# Patient Record
Sex: Female | Born: 1955
Health system: Southern US, Community
[De-identification: ages and names within clinical notes are randomized; demographics above are authoritative.]

## PROBLEM LIST (undated history)

## (undated) DIAGNOSIS — G629 Polyneuropathy, unspecified: Secondary | ICD-10-CM

## (undated) DIAGNOSIS — K219 Gastro-esophageal reflux disease without esophagitis: Secondary | ICD-10-CM

## (undated) DIAGNOSIS — E039 Hypothyroidism, unspecified: Secondary | ICD-10-CM

## (undated) DIAGNOSIS — E559 Vitamin D deficiency, unspecified: Secondary | ICD-10-CM

## (undated) DIAGNOSIS — Z780 Asymptomatic menopausal state: Secondary | ICD-10-CM

## (undated) DIAGNOSIS — N183 Chronic kidney disease, stage 3 unspecified: Secondary | ICD-10-CM

## (undated) DIAGNOSIS — M1990 Unspecified osteoarthritis, unspecified site: Secondary | ICD-10-CM

## (undated) DIAGNOSIS — E785 Hyperlipidemia, unspecified: Secondary | ICD-10-CM

## (undated) DIAGNOSIS — G4700 Insomnia, unspecified: Secondary | ICD-10-CM

## (undated) DIAGNOSIS — L659 Nonscarring hair loss, unspecified: Secondary | ICD-10-CM

## (undated) DIAGNOSIS — F329 Major depressive disorder, single episode, unspecified: Secondary | ICD-10-CM

## (undated) DIAGNOSIS — F419 Anxiety disorder, unspecified: Secondary | ICD-10-CM

## (undated) DIAGNOSIS — I10 Essential (primary) hypertension: Secondary | ICD-10-CM

## (undated) DIAGNOSIS — E119 Type 2 diabetes mellitus without complications: Secondary | ICD-10-CM

## (undated) DIAGNOSIS — F32A Depression, unspecified: Secondary | ICD-10-CM

## (undated) HISTORY — DX: Polyneuropathy, unspecified: G62.9

## (undated) HISTORY — DX: Chronic kidney disease, stage 3 unspecified: N18.30

## (undated) HISTORY — DX: Essential (primary) hypertension: I10

## (undated) HISTORY — DX: Depression, unspecified: F32.A

## (undated) HISTORY — DX: Anxiety disorder, unspecified: F41.9

## (undated) HISTORY — DX: Hyperlipidemia, unspecified: E78.5

## (undated) HISTORY — DX: Unspecified osteoarthritis, unspecified site: M19.90

## (undated) HISTORY — DX: Insomnia, unspecified: G47.00

## (undated) HISTORY — PX: CATARACT EXTRACTION, BILATERAL: SHX1313

## (undated) HISTORY — DX: Nonscarring hair loss, unspecified: L65.9

## (undated) HISTORY — DX: Vitamin D deficiency, unspecified: E55.9

## (undated) HISTORY — DX: Asymptomatic menopausal state: Z78.0

## (undated) HISTORY — PX: EYE SURGERY: SHX253

## (undated) HISTORY — DX: Gastro-esophageal reflux disease without esophagitis: K21.9

## (undated) HISTORY — PX: WISDOM TOOTH EXTRACTION: SHX21

## (undated) HISTORY — DX: Type 2 diabetes mellitus without complications: E11.9

## (undated) HISTORY — DX: Hypothyroidism, unspecified: E03.9

## (undated) HISTORY — DX: Major depressive disorder, single episode, unspecified: F32.9

---

## 2001-03-23 ENCOUNTER — Other Ambulatory Visit: Admission: RE | Admit: 2001-03-23 | Discharge: 2001-03-23 | Payer: Self-pay | Admitting: Obstetrics and Gynecology

## 2006-05-22 LAB — HM MAMMOGRAPHY

## 2009-06-22 DIAGNOSIS — H269 Unspecified cataract: Secondary | ICD-10-CM

## 2009-06-22 HISTORY — DX: Unspecified cataract: H26.9

## 2010-06-17 ENCOUNTER — Encounter (INDEPENDENT_AMBULATORY_CARE_PROVIDER_SITE_OTHER): Payer: Self-pay | Admitting: *Deleted

## 2010-06-22 HISTORY — PX: COLONOSCOPY: SHX174

## 2010-06-22 HISTORY — PX: POLYPECTOMY: SHX149

## 2010-06-22 LAB — HM COLONOSCOPY: HM Colonoscopy: NORMAL

## 2010-07-24 NOTE — Letter (Signed)
Summary: New Patient letter  [facility]  [address] [place], [place] [address]   Phone: [phone]  Fax: [phone]       06/17/2010 MRN: [id number]  Caroline Simmons [address] [place], [place]  [address]  Dear Ms. Ethier,  Welcome to the Gastroenterology Division at [facility].    You are scheduled to see Dr.  Sheryn Bison on July 25, 2010 at 9:30am on the 3rd floor at [facility], [address]  We ask that you try to arrive at our office 15 minutes prior to your appointment time to allow for check-in.  We would like you to complete the enclosed self-administered evaluation form prior to your visit and bring it with you on the day of your appointment.  We will review it with you.  Also, please bring a complete list of all your medications or, if you prefer, bring the medication bottles and we will list them.  Please bring your insurance card so that we may make a copy of it.  If your insurance requires a referral to see a specialist, please bring your referral form from your primary care physician.  Co-payments are due at the time of your visit and may be paid by cash, check or credit card.     Your office visit will consist of a consult with your physician (includes a physical exam), any laboratory testing he/she may order, scheduling of any necessary diagnostic testing (e.g. x-ray, ultrasound, CT-scan), and scheduling of a procedure (e.g. Endoscopy, Colonoscopy) if required.  Please allow enough time on your schedule to allow for any/all of these possibilities.    If you cannot keep your appointment, please call [phone] to cancel or reschedule prior to your appointment date.  This allows [place] the opportunity to schedule an appointment for another patient in need of care.  If you do not cancel or reschedule by 5 p.m. the business day prior to your appointment date, you will be charged a $50.00 late cancellation/no-show fee.    Thank  you for choosing [facility] for your medical needs.  We appreciate the opportunity to care for you.  Please visit [place] at our website  to learn more about our practice.                     Sincerely,                                                             The Gastroenterology Division

## 2010-07-25 ENCOUNTER — Ambulatory Visit: Payer: Self-pay | Admitting: Gastroenterology

## 2010-07-25 ENCOUNTER — Ambulatory Visit: Admit: 2010-07-25 | Payer: Self-pay | Admitting: Gastroenterology

## 2010-08-22 ENCOUNTER — Ambulatory Visit: Payer: Self-pay | Admitting: Gastroenterology

## 2010-08-26 ENCOUNTER — Ambulatory Visit (INDEPENDENT_AMBULATORY_CARE_PROVIDER_SITE_OTHER): Payer: BC Managed Care – PPO | Admitting: Gastroenterology

## 2010-08-26 ENCOUNTER — Other Ambulatory Visit: Payer: Self-pay | Admitting: Gastroenterology

## 2010-08-26 ENCOUNTER — Encounter: Payer: Self-pay | Admitting: Gastroenterology

## 2010-08-26 ENCOUNTER — Encounter (INDEPENDENT_AMBULATORY_CARE_PROVIDER_SITE_OTHER): Payer: Self-pay | Admitting: *Deleted

## 2010-08-26 ENCOUNTER — Other Ambulatory Visit: Payer: BC Managed Care – PPO

## 2010-08-26 DIAGNOSIS — K6289 Other specified diseases of anus and rectum: Secondary | ICD-10-CM

## 2010-08-26 DIAGNOSIS — I10 Essential (primary) hypertension: Secondary | ICD-10-CM | POA: Insufficient documentation

## 2010-08-26 DIAGNOSIS — E119 Type 2 diabetes mellitus without complications: Secondary | ICD-10-CM | POA: Insufficient documentation

## 2010-08-26 LAB — CBC WITH DIFFERENTIAL/PLATELET
Eosinophils Relative: 4.2 % (ref 0.0–5.0)
HCT: 37.1 % (ref 36.0–46.0)
Lymphs Abs: 1.9 10*3/uL (ref 0.7–4.0)
Monocytes Relative: 7.2 % (ref 3.0–12.0)
Platelets: 230 10*3/uL (ref 150.0–400.0)
WBC: 6.1 10*3/uL (ref 4.5–10.5)

## 2010-08-26 LAB — BASIC METABOLIC PANEL
CO2: 27 mEq/L (ref 19–32)
Glucose, Bld: 121 mg/dL — ABNORMAL HIGH (ref 70–99)
Potassium: 4.3 mEq/L (ref 3.5–5.1)
Sodium: 142 mEq/L (ref 135–145)

## 2010-08-26 LAB — CONVERTED CEMR LAB: Tissue Transglutaminase Ab, IgA: 2.2 units (ref <20)

## 2010-08-26 LAB — IBC PANEL: Saturation Ratios: 12.9 % — ABNORMAL LOW (ref 20.0–50.0)

## 2010-08-26 LAB — MAGNESIUM: Magnesium: 2.1 mg/dL (ref 1.5–2.5)

## 2010-08-26 LAB — SEDIMENTATION RATE: Sed Rate: 26 mm/hr — ABNORMAL HIGH (ref 0–22)

## 2010-08-26 LAB — HEPATIC FUNCTION PANEL
ALT: 31 U/L (ref 0–35)
AST: 26 U/L (ref 0–37)
Albumin: 4.3 g/dL (ref 3.5–5.2)
Alkaline Phosphatase: 58 U/L (ref 39–117)
Total Protein: 7 g/dL (ref 6.0–8.3)

## 2010-08-27 ENCOUNTER — Other Ambulatory Visit: Payer: Self-pay | Admitting: Gastroenterology

## 2010-08-27 ENCOUNTER — Other Ambulatory Visit (AMBULATORY_SURGERY_CENTER): Payer: BC Managed Care – PPO | Admitting: Gastroenterology

## 2010-08-27 DIAGNOSIS — K621 Rectal polyp: Secondary | ICD-10-CM

## 2010-08-27 DIAGNOSIS — Z1211 Encounter for screening for malignant neoplasm of colon: Secondary | ICD-10-CM

## 2010-08-27 DIAGNOSIS — K5730 Diverticulosis of large intestine without perforation or abscess without bleeding: Secondary | ICD-10-CM

## 2010-08-27 DIAGNOSIS — K620 Anal polyp: Secondary | ICD-10-CM

## 2010-09-02 NOTE — Letter (Signed)
Summary: [facility] Instructions  [facility]  [address] [place], [place] [address]   Phone: [phone]  Fax: [phone]       CALLYN SEVERTSON    25-Mar-1956    MRN: [id number]        Procedure Day Dorna Bloom: Wednesday 08/27/2010     Arrival Time: 1:00pm     Procedure Time: 2:00pm     Location of Procedure:                    X  [facility] (4th Floor)                        PREPARATION FOR COLONOSCOPY WITH MOVIPREP    THE DAY BEFORE YOUR PROCEDURE         Tuesday 08/26/2010  1.  Drink clear liquids the entire day-NO SOLID FOOD  2.  Do not drink anything colored red or purple.  Avoid juices with pulp.  No orange juice.  3.  Drink at least 64 oz. (8 glasses) of fluid/clear liquids during the day to prevent dehydration and help the prep work efficiently.  CLEAR LIQUIDS INCLUDE: Water Jello Ice Popsicles Tea (sugar ok, no milk/cream) Powdered fruit flavored drinks Coffee (sugar ok, no milk/cream) Gatorade Juice: apple, white grape, white cranberry  Lemonade Clear bullion, consomm, broth Carbonated beverages (any kind) Strained chicken noodle soup Hard Candy                             4.  In the morning, mix first dose of MoviPrep solution:    Empty 1 Pouch A and 1 Pouch B into the disposable container    Add lukewarm drinking water to the top line of the container. Mix to dissolve    Refrigerate (mixed solution should be used within 24 hrs)  5.  Begin drinking the prep at 5:00 p.m. The MoviPrep container is divided by 4 marks.   Every 15 minutes drink the solution down to the next mark (approximately 8 oz) until the full liter is complete.   6.  Follow completed prep with 16 oz of clear liquid of your choice (Nothing red or purple).  Continue to drink clear liquids until bedtime.  7.  Before going to bed, mix second dose of MoviPrep solution:    Empty 1 Pouch A and 1 Pouch B into the disposable container    Add lukewarm drinking  water to the top line of the container. Mix to dissolve    Refrigerate  THE DAY OF YOUR PROCEDURE      Wednesday 08/27/2010  Beginning at 9:00a, (5 hours before procedure):         1. Every 15 minutes, drink the solution down to the next mark (approx 8 oz) until the full liter is complete.  2. Follow completed prep with 16 oz. of clear liquid of your choice.    3. You may drink clear liquids until 12(noon) (2 HOURS BEFORE PROCEDURE).   MEDICATION INSTRUCTIONS  Unless otherwise instructed, you should take regular prescription medications with a small sip of water   as early as possible the morning of your procedure.  Diabetic patients - see separate instructions.           OTHER INSTRUCTIONS  You will need a responsible adult at least 54 years of age to accompany you and drive you home.   This person  must remain in the waiting room during your procedure.  Wear loose fitting clothing that is easily removed.  Leave jewelry and other valuables at home.  However, you may wish to bring a book to read or  an iPod/MP3 player to listen to music as you wait for your procedure to start.  Remove all body piercing jewelry and leave at home.  Total time from sign-in until discharge is approximately 2-3 hours.  You should go home directly after your procedure and rest.  You can resume normal activities the  day after your procedure.  The day of your procedure you should not:   Drive   Make legal decisions   Operate machinery   Drink alcohol   Return to work  You will receive specific instructions about eating, activities and medications before you leave.    The above instructions have been reviewed and explained to me by   _______________________    I fully understand and can verbalize these instructions _____________________________ Date _________

## 2010-09-02 NOTE — Assessment & Plan Note (Signed)
Summary: ANAL LESION..EM  SCHED WITH JAMIE [phone]  [employer]- INS COPAY A...   History of Present Illness Visit Type: Initial Consult Primary GI MD: Sheryn Bison MD FACP FAGA Primary Provider: [facility] Requesting Provider: Tracey Harries, MD Chief Complaint: Patient here today for further evaluation of anal lesion. She denies any GI symptoms at this time. History of Present Illness:   Extremely Nice 55 year old [place] female referred by Dr. Tracey Harries for evaluation of abnormal anal exam. The patient herself denies any gastrointestinal symptoms whatsoever. She has regular bowel movements without melena, hematochezia, or abdominal pain. She denies any history of known rectal trauma or rectal fissures. Her appetite is good and her weight is stable.  In December she had some hemorrhoids noted and was placed on Anusol-HC suppositories. She currently is not on any rectal suppositories or salves.Family history noncontributory. She has not had previous endoscopies or colonoscopies.there is no history of upper gastrointestinal hepatobiliary complaints. Only symptom today is burning feet bilaterally worse at night unresponsive to Lyrica and Gabapentin. She has had adult onset diabetes for 3 years treated with metformin 500 mg twice a day, central hypertension treated with lisinopril-HCTZ 20-12.5 mg a day.   GI Review of Systems      Denies abdominal pain, acid reflux, belching, bloating, chest pain, dysphagia with liquids, dysphagia with solids, heartburn, loss of appetite, nausea, vomiting, vomiting blood, weight loss, and  weight gain.        Denies anal fissure, black tarry stools, change in bowel habit, constipation, diarrhea, diverticulosis, fecal incontinence, heme positive stool, hemorrhoids, irritable bowel syndrome, jaundice, light color stool, liver problems, rectal bleeding, and  rectal pain. Preventive Screening-Counseling & Management  Alcohol-Tobacco  Smoking Status: quit  Caffeine-Diet-Exercise     Does Patient Exercise: yes      Drug Use:  no.      Current Medications (verified): 1)  Lisinopril-Hydrochlorothiazide 20-12.5 Mg Tabs (Lisinopril-Hydrochlorothiazide) .... Take 1 Tablet By Mouth Once Daily 2)  Metformin Hcl 500 Mg Tabs (Metformin Hcl) .... Take 1 Tablet By Mouth Two Times A Day 3)  Gabapentin 600 Mg Tabs (Gabapentin) .... Take 1 Tablet By Mouth Three Times A Day 4)  Bayer Low Strength 81 Mg Tbec (Aspirin) .... Take 1 Tablet By Mouth Once A Day  Allergies (verified): No Known Drug Allergies  Past History:  Past medical, surgical, family and social histories (including risk factors) reviewed for relevance to current acute and chronic problems.  Past Medical History: Anal Lesion  Past Surgical History: C-Section  Family History: Reviewed history and no changes required. No FH of Colon Cancer: Family History of Diabetes: Mother  Social History: Reviewed history and no changes required. Patient is a former smoker. -stopped over 10 years ago Alcohol Use - no Illicit Drug Use - no Patient gets regular exercise. Smoking Status:  quit Drug Use:  no Does Patient Exercise:  yes  Review of Systems  The patient denies allergy/sinus, anemia, anxiety-new, arthritis/joint pain, back pain, blood in urine, breast changes/lumps, change in vision, confusion, cough, coughing up blood, depression-new, fainting, fatigue, fever, headaches-new, hearing problems, heart murmur, heart rhythm changes, itching, menstrual pain, muscle pains/cramps, night sweats, nosebleeds, pregnancy symptoms, shortness of breath, skin rash, sleeping problems, sore throat, swelling of feet/legs, swollen lymph glands, thirst - excessive , urination - excessive , urination changes/pain, urine leakage, vision changes, and voice change.   Neuro:  Complains of abnormal sensation; denies weakness, paralysis, seizures, syncope, tremors, vertigo, transient  blindness, frequent falls,  frequent headaches, difficulty walking, headache, sciatica, radiculopathy other:, restless legs, memory loss, and confusion.  Vital Signs:  Patient profile:   55 year old female Height:      63.75 inches Weight:      192.38 pounds BMI:     33.40 BSA:     1.92 Pulse rate:   80 / minute Pulse rhythm:   regular BP sitting:   132 / 90  (left arm)  Vitals Entered By: Lamona Curl CMA Duncan Dull) (August 26, 2010 9:16 AM)  Physical Exam  General:  Well developed, well nourished, no acute distress.healthy appearing.   Head:  Normocephalic and atraumatic. Eyes:  PERRLA, no icterus.exam deferred to patient's ophthalmologist.   Neck:  Supple; no masses or thyromegaly. Lungs:  Clear throughout to auscultation. Heart:  Regular rate and rhythm; no murmurs, rubs,  or bruits. Abdomen:  Soft, nontender and nondistended. No masses, hepatosplenomegaly or hernias noted. Normal bowel sounds. Rectal:  Normal exam.prominent at that anterior skin tag noted. I cannot appreciate rectal masses or tenderness. Stool is guaiac-negative. Msk:  Symmetrical with no gross deformities. Normal posture. Pulses:  Normal pulses noted. Extremities:  No clubbing, cyanosis, edema or deformities noted. Neurologic:  Alert and  oriented x4;  grossly normal neurologically. Cervical Nodes:  No significant cervical adenopathy. Psych:  Alert and cooperative. Normal mood and affect.   Impression & Recommendations:  Problem # 1:  RECTAL PAIN (EAV-409.81) Assessment Improved Probable healed anal fissure causing prominent anterior skin tag. She is currently asymptomatic and stool is guaiac negative. I have scheduled colonoscopy at her convenience. Orders: TLB-CBC Platelet - w/Differential (85025-CBCD) TLB-BMP (Basic Metabolic Panel-BMET) (80048-METABOL) TLB-Hepatic/Liver Function Pnl (80076-HEPATIC) TLB-TSH (Thyroid Stimulating Hormone) (84443-TSH) TLB-B12, Serum-Total ONLY  (19147-W29) TLB-Ferritin (82728-FER) TLB-Folic Acid (Folate) (82746-FOL) TLB-IBC Pnl (Iron/FE;Transferrin) (83550-IBC) TLB-Sedimentation Rate (ESR) (85652-ESR) TLB-IgA (Immunoglobulin A) (82784-IGA) TLB-Magnesium (Mg) (83735-MG) T-Sprue Panel (Celiac Disease Aby Eval) (83516x3/[address]) Colonoscopy (Colon)  Problem # 2:  HYPERTENSION (ICD-401.9) Assessment: Improved blood pressure today 132/90, she is to continue her antihypertensives as outlined.  Problem # 3:  DIABETES MELLITUS, TYPE II (ICD-250.00) Assessment: Improved continue metformin 500 mg twice a day. Her peripheral neuropathy in her feet may be related to her diabetes although this is unlikely. I have ordered screening labs including B12, magnesium, celiac profile, and sedimentation rate. She has an appointment with neurology in mid March for evaluation.  Patient Instructions: 1)  Copy sent to : [facility] & Tracey Harries, MD 2)  Your procedure has been scheduled for 08/27/2010, please follow the seperate instructions.  3)  [facility] Patient Information Guide given to patient.  4)  Colonoscopy and Flexible Sigmoidoscopy brochure given.  5)  Your prescription(s) have been sent to you pharmacy.  6)  Please go to the basement today for your labs.  7)  The medication list was reviewed and reconciled.  All changed / newly prescribed medications were explained.  A complete medication list was provided to the patient / caregiver. Prescriptions: MOVIPREP 100 GM  SOLR (PEG-KCL-NACL-NASULF-NA ASC-C) As per prep instructions.  #1 x 0   Entered by:   Harlow Mares CMA (AAMA)   Authorized by:   Mardella Layman MD [facility]   Signed by:   Harlow Mares CMA (AAMA) on 08/26/2010   Method used:   Electronically to        [facility] [phone]* (retail)       [address]  [place], [place]  [address]       Ph: [phone]       Fax: [phone]   RxID:   [id number]

## 2010-09-02 NOTE — Letter (Signed)
Summary: Diabetic Instructions  [facility]  [address] [place], [place] [address]   Phone: [phone]  Fax: [phone]    Caroline Simmons [birth year] MRN: [id number]   X   ORAL DIABETIC MEDICATION INSTRUCTIONS  The day before your procedure:   Take your diabetic pill as you do normally  The day of your procedure:   Do not take your diabetic pill    We will check your blood sugar levels during the admission process and again in Recovery before discharging you home

## 2010-09-02 NOTE — Procedures (Addendum)
Summary: Colonoscopy  Patient: Caroline Simmons Note: All result statuses are Final unless otherwise noted.  Tests: (1) Colonoscopy (COL)   COL Colonoscopy           DONE     [facility]     [address]     [place], [place]  [address]          COLONOSCOPY PROCEDURE REPORT          PATIENT:  [name], [name]  MR#:  [id number]     BIRTHDATE:  08/18/1955, 54 yrs. old  GENDER:  female     ENDOSCOPIST:  Vania Rea. Jarold Motto, MD, [facility]     REF. BY:  Tracey Harries, M.D.     PROCEDURE DATE:  08/27/2010     PROCEDURE:  Colonoscopy with biopsy     ASA CLASS:  Class II     INDICATIONS:  Routine Risk Screening ANTERIOR SKIN TAG.     MEDICATIONS:   Fentanyl 50 mcg IV, Versed 8 IV          DESCRIPTION OF PROCEDURE:   After the risks benefits and     alternatives of the procedure were thoroughly explained, informed     consent was obtained.  Digital rectal exam was performed and     revealed no abnormalities.  ANTERIOR SKIN TAG NOTED. The LB 180AL     [id number] endoscope was introduced through the anus and advanced to     the cecum, which was identified by both the appendix and ileocecal     valve, without limitations.  The quality of the prep was     excellent, using MoviPrep.  The instrument was then slowly     withdrawn as the colon was fully examined.     <<PROCEDUREIMAGES>>          FINDINGS:  Scattered diverticula were found throughout the colon.     A diminutive polyp was found in the rectum. COLD BIOPSY REMOVED.NO     FISSURES.FISTULAE, MASSES NOTED.   Retroflexed views in the rectum     revealed no abnormalities.    The scope was then withdrawn from     the patient and the procedure completed.          COMPLICATIONS:  None     ENDOSCOPIC IMPRESSION:     1) Diverticula, scattered throughout the colon     2) Diminutive polyp in the rectum     R/O ADENOMA.     RECOMMENDATIONS:     1) Repeat Colonscopy in 5-10 years.     2) high fiber diet     REPEAT EXAM:  No        ______________________________     Vania Rea. Jarold Motto, MD, [facility]          CC:          n.     eSIGNED:   Vania Rea. Patterson at 08/27/2010 02:41 PM          [name], [id number]  Note: An exclamation mark (!) indicates a result that was not dispersed into the flowsheet. Document Creation Date: 08/27/2010 2:41 PM _______________________________________________________________________  (1) Order result status: Final Collection or observation date-time: 08/27/2010 14:33 Requested date-time:  Receipt date-time:  Reported date-time:  Referring Physician:   Ordering Physician: Sheryn Bison [phone]) Specimen Source:  Source: Launa Grill Order Number: [id number] Lab site:   Appended Document: Colonoscopy  10 year followup  Appended Document: Colonoscopy     Procedures Next  Due Date:    Colonoscopy: 08/2020

## 2010-09-08 ENCOUNTER — Encounter: Payer: Self-pay | Admitting: Gastroenterology

## 2010-09-18 NOTE — Letter (Signed)
Summary: Patient Notice- Polyp Results  [facility]  [address] [place], [place] [address]   Phone: [phone]  Fax: [phone]        September 08, 2010 MRN: [id number]    Caroline Simmons [address] [place], [place]  [address]    Dear Ms. Facemire,  I am pleased to inform you that the colon polyp(s) removed during your recent colonoscopy was (were) found to be benign (no cancer detected) upon pathologic examination.  I recommend you have a repeat colonoscopy examination in  10_ years to look for recurrent polyps, as having colon polyps increases your Simmons for having recurrent polyps or even colon cancer in the future.  Should you develop new or worsening symptoms of abdominal pain, bowel habit changes or bleeding from the rectum or bowels, please schedule an evaluation with either your primary care physician or with me.  Additional information/recommendations:  _x_ No further action with gastroenterology is needed at this time. Please      follow-up with your primary care physician for your other healthcare      needs.  __ Please call [phone] to schedule a return visit to review your      situation.  __ Please keep your follow-up visit as already scheduled.  __ Continue treatment plan as outlined the day of your exam.  Please call us if you are having persistent problems or have questions about your condition that have not been fully answered at this time.  Sincerely,  Mardella Layman MD [facility]  This letter has been electronically signed by your physician.  Appended Document: Patient Notice- Polyp Results letter mailed

## 2011-04-09 LAB — TSH: TSH: 5.19 u[IU]/mL (ref <=5.90)

## 2011-08-11 ENCOUNTER — Encounter: Payer: Self-pay | Admitting: *Deleted

## 2011-08-11 DIAGNOSIS — E039 Hypothyroidism, unspecified: Secondary | ICD-10-CM

## 2011-08-11 DIAGNOSIS — G4700 Insomnia, unspecified: Secondary | ICD-10-CM

## 2011-08-11 DIAGNOSIS — I10 Essential (primary) hypertension: Secondary | ICD-10-CM

## 2011-08-12 ENCOUNTER — Encounter: Payer: Self-pay | Admitting: Family Medicine

## 2011-08-12 ENCOUNTER — Ambulatory Visit (INDEPENDENT_AMBULATORY_CARE_PROVIDER_SITE_OTHER): Payer: 59 | Admitting: Family Medicine

## 2011-08-12 DIAGNOSIS — E039 Hypothyroidism, unspecified: Secondary | ICD-10-CM

## 2011-08-12 DIAGNOSIS — E119 Type 2 diabetes mellitus without complications: Secondary | ICD-10-CM

## 2011-08-12 MED ORDER — LEVOTHYROXINE SODIUM 50 MCG PO TABS: 50.0000 ug | ORAL_TABLET | Freq: Every day | ORAL | Status: DC

## 2011-08-12 MED ORDER — METFORMIN HCL 500 MG PO TABS: ORAL_TABLET | ORAL | Status: DC

## 2011-08-12 MED ORDER — LISINOPRIL-HYDROCHLOROTHIAZIDE 20-12.5 MG PO TABS: 1.0000 | ORAL_TABLET | Freq: Every day | ORAL | Status: DC

## 2011-08-12 NOTE — Patient Instructions (Signed)
Peripheral Neuropathy Peripheral neuropathy is a common disorder of your nerves resulting from damage. CAUSES  This disorder may be caused by a disease of the nerves or illness. Many neuropathies have well known causes such as:  Diabetes. This is one of the most common causes.   Uremia.   AIDS.   Nutritional deficiencies.   Other causes include mechanical pressures. These may be from:   Compression.   Injury.   Contusions or bruises.   Fracture or dislocated bones.   Pressure involving the nerves close to the surface. Nerves such as the ulnar, or radial can be injured by prolonged use of crutches.  Other injuries may come from:  Tumor.   Hemorrhage or bleeding into a nerve.   Exposure to cold or radiation.   Certain medicines or toxic substances (rare).   Vascular or collagen disorders such as:   Atherosclerosis.   Systemic lupus erythematosus.   Scleroderma.   Sarcoidosis.   Rheumatoid arthritis.   Polyarteritis nodosa.   A large number of cases are of unknown cause.  SYMPTOMS  Common problems include:  Weakness.   Numbness.   Abnormal sensations (paresthesia) such as:   Burning.   Tickling.   Pricking.   Tingling.   Pain in the arms, hands, legs and/or feet.  TREATMENT  Therapy for this disorder differs depending on the cause. It may vary from medical treatment with medications or physical therapy among others.   For example, therapy for this disorder caused by diabetes involves control of the diabetes.   In cases where a tumor or ruptured disc is the cause, therapy may involve surgery. This would be to remove the tumor or to repair the ruptured disc.   In entrapment or compression neuropathy, treatment may consist of splinting or surgical decompression of the ulnar or median nerves. A common example of entrapment neuropathy is carpal tunnel syndrome. This has become more common because of the increasing use of computers.   Peroneal and  radial compression neuropathies may require avoidance of pressure.   Physical therapy and/or splints may be useful in preventing contractures. This is a condition in which shortened muscles around joints cause abnormal and sometimes painful positioning of the joints.  Document Released: 05/29/2002 Document Revised: 02/18/2011 Document Reviewed: 06/08/2005 [employer] Patient Information 2012 [employer]. 

## 2011-08-12 NOTE — Progress Notes (Signed)
Quick Note:  Please notify pt that results are normal. ______ 

## 2011-08-12 NOTE — Progress Notes (Signed)
  Subjective:    Patient ID: Caroline Simmons, female    DOB: 1956/03/01, 56 y.o.   MRN: [id number]  HPI    This 56 y.o AA female returns for f/u: Type 2 Diabetes and Hypothyroidism. She checks FSBS  and values are about 110-120. Denies hypoglycemia. Taking Metformin 500 mg 1 tab q AM and 2 tabs  in PM; meads are taken before meals and she takes one of the PM Metformin at bedtime.  She c/o puffy eyelids, worse at bedtime. No other symptoms suggesting absence of euthyroid state.  Bilateral foot pain with burning continues; takes med as prescribed by foot specialist. She plans to contact   his office today for follow-up. She is trying to resume some sort of regular exercise.    Review of Systems  As per HPI; otherwise, Negative     Objective:   Physical Exam  Constitutional: She is oriented to person, place, and time. She appears well-developed and well-nourished. No distress.  HENT:  Head: Normocephalic and atraumatic.  Eyes: EOM are normal. No scleral icterus.  Cardiovascular: Normal rate and intact distal pulses.   Pulmonary/Chest: Effort normal. No respiratory distress.  Musculoskeletal: Normal range of motion.  Neurological: She is alert and oriented to person, place, and time. No cranial nerve deficit.       Foot exam performed by CMA.  Skin: Skin is warm and dry. No rash noted.  Psychiatric: She has a normal mood and affect. Her behavior is normal.   A1c= 6.4 %  TSH  (03/2011)= 5.19      Assessment & Plan:   1. Diabetes type 2, controlled  POCT HgB A1C; RF: Metformin 500 mg continue 1tab q AM and 2 tabs q PM; pt advised to take medication after meals to avoid hypoglycemia  2. Hypothyroidism  TSH pending; continue current dose of medication

## 2011-08-31 ENCOUNTER — Telehealth: Payer: Self-pay

## 2011-08-31 NOTE — Telephone Encounter (Signed)
.  [facility] PT IN NEED OF HER METFORMIN AND LISINOPRIL. PLEASE CALL [phone]    [facility] [address] IN [place]

## 2011-08-31 NOTE — Telephone Encounter (Signed)
Please have patient contact their pharmacy. We can no longer keep taking requests over the phone.  Caroline Simmons

## 2011-09-01 NOTE — Telephone Encounter (Signed)
Called pt LMOM to have pharmacy fax requests

## 2011-09-09 ENCOUNTER — Telehealth: Payer: Self-pay

## 2011-09-09 NOTE — Telephone Encounter (Signed)
Please contact pt she was crying uncontrollably and states she was prescribed thyroid medicine by Dr Audria Nine and she thinks that's why she is depressed and gaining weight, please contact pt.

## 2011-09-09 NOTE — Telephone Encounter (Signed)
Pt states she is crying uncontrollably (could hardly understand her on the phone). She thinks it is due to her thyroid med--she is gaining weight instead of losing weight. Please advise.

## 2011-09-09 NOTE — Telephone Encounter (Signed)
I might suggest pt recheck.  Dr. Audria Nine Rx the same med that pt stated she was on at her visit and she just refilled the same meds because her labs were normal.

## 2011-09-10 NOTE — Telephone Encounter (Signed)
lmom to rtc for ov

## 2011-09-11 NOTE — Telephone Encounter (Signed)
Pt returning tl clinical phone call

## 2011-09-15 ENCOUNTER — Ambulatory Visit: Payer: 59 | Admitting: Internal Medicine

## 2011-09-15 VITALS — BP 147/94 | HR 97 | Temp 98.4°F | Resp 18 | Ht 65.0 in | Wt 189.0 lb

## 2011-09-15 DIAGNOSIS — E1140 Type 2 diabetes mellitus with diabetic neuropathy, unspecified: Secondary | ICD-10-CM

## 2011-09-15 DIAGNOSIS — E1049 Type 1 diabetes mellitus with other diabetic neurological complication: Secondary | ICD-10-CM

## 2011-09-15 DIAGNOSIS — K219 Gastro-esophageal reflux disease without esophagitis: Secondary | ICD-10-CM

## 2011-09-15 DIAGNOSIS — Z719 Counseling, unspecified: Secondary | ICD-10-CM

## 2011-09-15 DIAGNOSIS — F329 Major depressive disorder, single episode, unspecified: Secondary | ICD-10-CM

## 2011-09-15 DIAGNOSIS — F4321 Adjustment disorder with depressed mood: Secondary | ICD-10-CM

## 2011-09-15 DIAGNOSIS — R5383 Other fatigue: Secondary | ICD-10-CM

## 2011-09-15 DIAGNOSIS — Z79899 Other long term (current) drug therapy: Secondary | ICD-10-CM

## 2011-09-15 DIAGNOSIS — R070 Pain in throat: Secondary | ICD-10-CM

## 2011-09-15 DIAGNOSIS — R635 Abnormal weight gain: Secondary | ICD-10-CM

## 2011-09-15 MED ORDER — FLUOXETINE HCL 20 MG PO TABS: ORAL_TABLET | ORAL | Status: DC

## 2011-09-15 MED ORDER — HYDROCODONE-ACETAMINOPHEN 5-500 MG PO TABS: 1.0000 | ORAL_TABLET | Freq: Four times a day (QID) | ORAL | Status: AC | PRN

## 2011-09-15 NOTE — Patient Instructions (Signed)

## 2011-09-15 NOTE — Progress Notes (Signed)
  Subjective:    Patient ID: Caroline Simmons, female    DOB: 1955/07/18, 56 y.o.   MRN: [id number]  HPI Has depression caused by losing her job and not able to find another. She is lonely and gaining weight even while exercising. See depression screen. Also has a sore throat for a while. Did quit smoking 5 yrs. No illness     Review of Systems Dr. Audria Nine does regular care and will f/up.   Objective:   Physical Exam Crying, sad and mildly hoarse. Recent lab work normal Heart, lungs ,abdomen, thyroid all normal       Assessment & Plan:  Acute reactive depression  Amitriptylene interaction with SSRIs reviewed and counseled.  Trial of Fluoxitine start with 10mg  and may go to 20mg . Keep dose of Elavil at 100mg  or less at hs. Close f/up with Dr. Audria Nine, appt made  Refer to counselor

## 2011-09-29 ENCOUNTER — Encounter: Payer: Self-pay | Admitting: Family Medicine

## 2011-09-29 ENCOUNTER — Ambulatory Visit (INDEPENDENT_AMBULATORY_CARE_PROVIDER_SITE_OTHER): Payer: 59 | Admitting: Family Medicine

## 2011-09-29 VITALS — BP 140/88 | HR 104 | Temp 98.3°F | Resp 16 | Ht 64.5 in | Wt 190.2 lb

## 2011-09-29 DIAGNOSIS — F418 Other specified anxiety disorders: Secondary | ICD-10-CM

## 2011-09-29 DIAGNOSIS — F341 Dysthymic disorder: Secondary | ICD-10-CM

## 2011-09-29 DIAGNOSIS — E1140 Type 2 diabetes mellitus with diabetic neuropathy, unspecified: Secondary | ICD-10-CM

## 2011-09-29 DIAGNOSIS — E1149 Type 2 diabetes mellitus with other diabetic neurological complication: Secondary | ICD-10-CM

## 2011-09-29 DIAGNOSIS — E1142 Type 2 diabetes mellitus with diabetic polyneuropathy: Secondary | ICD-10-CM

## 2011-09-29 NOTE — Patient Instructions (Signed)
Diabetic Neuropathy Diabetic neuropathy is a common complication caused by diabetes. Neuropathy is a term that means nerve disease or damage. If your diabetes is uncontrolled and you have high blood glucose (sugar) levels, over time, this can lead to damage to nerves throughout your body. There are three types of diabetic neuropathy:   Peripheral.   Autonomic.   Focal.  PERIPHERAL NEUROPATHY Peripheral neuropathy is the most common form of diabetic neuropathy. It causes damage to the nerves of the feet and legs and eventually the hands and arms.  SYMPTOMS  Peripheral neuropathy occurs slowly over time. The peripheral nerves sense touch, hot and cold, and pain. When these nerves no longer work:   Your feet become numb.   You can no longer feel pressure or pain in your feet.   You may have burning, stabbing or aching pain.  This can lead to:  Thick calluses over pressure areas.   Pressure sores.   Ulcers. Ulcers can become infected with germs (bacteria) and can even lead to infection in the bones of the feet.  DIAGNOSIS  The diagnosis of diabetic neuropathy is difficult at best. Sensory function testing can be done with:  Light touch using a monofilament.   Vibration with tuning fork.   Sharp sensation with pin prick  Other tests that can help diagnose neuropathy are:  Nerve Conduction Velocities (NCV). This checks the transmission of electrical current through a nerve.   Electromyography (EMG). This shows how muscles respond to electrical signals transmitted by nearby nerves.   Quantitative sensory testing, which is used to assess how your nerves respond to vibration and changes in temperature.  AUTONOMIC NEUROPATHY The autonomic nervous system controls functions that you do not think about. Examples would be:   Heart beat.   Regulation of body temperature.   Blood pressure.   Urination.   Digestion.   Sweating.   Sexual function.  SYMPTOMS  The symptoms of  autonomic neuropathy vary depending on which nerves are affected.   There can be problems with digestion such as:   Feeling sick to your stomach (nausea).   Vomiting.   Bloating.   Constipation.   Diarrhea.   Abdominal pain.   Difficulty with urination may occur because of the inability to sense when your bladder is full. You may have urine leakage (incontinence) or inability to empty your bladder completely (retention).   Palpitations or a feeling of an abnormal heart beat.   Blood pressure drops on arising (orthostatic hypotension). This can happen when you first sit up or stand up. It causes you to feel:   Dizzy.   Weak.   Faint.   Sexual functioning:   In men, inability to attain and maintain an erection.   In women, vaginal dryness and problems with decreased sexual desire and arousal.  DIAGNOSIS  Diagnosis is often based on reported symptoms. Tell your medical caregiver if you experience:   Dizziness.   Constipation.   Diarrhea.   Inappropriate urination or inability to urinate.   Inability to get or maintain an erection.  Tests that may be done include:  An EKG or Holter Monitor. These are tests that can help show problems with the heart rate or heart rhythm.   X-rays can be used to find if there are problems with your ability to properly empty food from your stomach into the small intestine after eating.  FOCAL NEUROPATHY Focal neuropathy affects just one nerve tract and occurs suddenly. However, it usually improves by itself   over time. It does not cause long term damage, and treatments are usually needed only until the problem improves. SYMPTOMS  Examples include:   Abnormal eye movements or abnormal alignment of both eyes.   Weakness in the wrist.   Foot drop, which results in inability to lift the foot properly. This causes abnormal walking or foot movement.  DIAGNOSIS  Diagnosis is made based on your symptoms and what your caregiver finds on  your exam. Other tests that may be done include:  Nerve Conduction Velocities (NCV). This checks the transmission of electrical current through a nerve.   Electromyography (EMG). This shows how muscles respond to electrical signals transmitted by nearby nerves.   Quantitative sensory testing, which is used to assess how your nerves respond to vibration and changes in temperature.  TREATMENT Once nerve damage occurs it cannot be reversed. The goal of treatment is to keep the disease from getting worse. If it gets worse, it will affect more nerve fibers. Controlling your blood (sugar) is the key. You will need to keep your blood glucose and A1c at the target range prescribed by your caregiver. Things that will help control blood glucose levels include:  Blood glucose monitoring.   Meal planning.   Physical activity.   Diabetes medication.  Over time, maintaining lower blood glucose levels helps lessen symptoms. Sometimes, prescription pain medicine is needed. Focal neuropathy can be painful and unpredictable and occurs most often in older adults with diabetes.  SEEK MEDICAL CARE IF:   You develop peripheral nerve symptoms such as burning, numbness, or pain in your feet, legs or hands.   You develop autonomic nerve symptoms such as:   Dizziness.   Abnormal urinary control.   Inability to get an erection.   You develop focal nerve symptoms such as sudden abnormal eye movements or sudden foot drop.  Document Released: 08/17/2001 Document Revised: 05/28/2011 Document Reviewed: 11/16/2008 Exit[employer] Patient Information 2012 [employer]. 

## 2011-09-29 NOTE — Progress Notes (Signed)
  Subjective:    Patient ID: Caroline Simmons, female    DOB: October 12, 1955, 56 y.o.   MRN: [id number]  HPI   This woman returns for follow-up after starting treatment for depression; she is taking Fluoxetine  20 mg every morning and still has crying spells and anhedonia with insomnia. This is complicated by Diabetic  Neuropathy involving her feet, with chronic pain and burning not adequately resolved with Amitriptyline. She is  only taking 100 mg of Amitriptyline at night because taking 2 tablets was giving her a "hung-over" type feeling.  She is getting some relief with  Hydrocodone for pain. She is depressed because she has been unemployed  almost 2 years; she does not have the mental and emotional strength to do any meaningful job search at this time.  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 She is considering EMT certification  but is having concentration problems at present and is not able to study for the pre-qualification exam.  She has little support (no family here) and few close friends.    Review of Systems  Constitutional: Positive for activity change and fatigue.  Respiratory: Negative for chest tightness.   Cardiovascular: Negative for chest pain and leg swelling.  Neurological:       Bilateral foot pain  Psychiatric/Behavioral: Positive for sleep disturbance, dysphoric mood and decreased concentration. Negative for suicidal ideas. The patient is nervous/anxious.        Tearful every day       Objective:   Physical Exam  Vitals reviewed. Constitutional: She is oriented to person, place, and time. She appears well-developed and well-nourished. She appears distressed.       Tearful  HENT:  Head: Normocephalic and atraumatic.  Eyes:       Conjunctivae injected  Cardiovascular: Normal rate.   Pulmonary/Chest: Effort normal. No respiratory distress.  Neurological: She is alert and oriented to person, place, and time. No cranial  nerve deficit.  Psychiatric: Judgment and thought content normal.       Flat affect, depressed mood          Assessment & Plan:   1. Depression with anxiety      Continue Fluoxetine 20 mg -up dose to 2 tablets every AM  2. Diabetic neuropathy    Last A1c= 6.4% in February 2013; no medication changes; information given to pt re: Anti-inflammatory diet; may need to consider Lyrica to reduce symptoms

## 2011-10-13 ENCOUNTER — Encounter: Payer: Self-pay | Admitting: Family Medicine

## 2011-10-13 ENCOUNTER — Ambulatory Visit (INDEPENDENT_AMBULATORY_CARE_PROVIDER_SITE_OTHER): Payer: 59 | Admitting: Family Medicine

## 2011-10-13 VITALS — BP 126/92 | HR 105 | Temp 98.5°F | Resp 16 | Ht 65.0 in | Wt 186.0 lb

## 2011-10-13 DIAGNOSIS — E119 Type 2 diabetes mellitus without complications: Secondary | ICD-10-CM

## 2011-10-13 DIAGNOSIS — F329 Major depressive disorder, single episode, unspecified: Secondary | ICD-10-CM

## 2011-10-13 DIAGNOSIS — E1140 Type 2 diabetes mellitus with diabetic neuropathy, unspecified: Secondary | ICD-10-CM

## 2011-10-13 MED ORDER — HYDROCODONE-ACETAMINOPHEN 5-500 MG PO TABS: ORAL_TABLET | ORAL | Status: DC

## 2011-10-13 NOTE — Progress Notes (Signed)
  Subjective:    Patient ID: Caroline Simmons, female    DOB: [birth year], 56 y.o.   MRN: [id number]  HPI Pt feels a lot better but she still has some anxiety at night which awakens her at night. She uses  a [place] oil [place] healing oil) which helps with pain. Taking Hydrocodone for pain also helps.  She takes Amitriptyline if she wakes up in the middle of the night and has trouble getting back to sleep.  She still having problems with concentration and mental clarity.    Review of Systems   As per HPI     Objective:   Physical Exam  Vitals reviewed. Constitutional: She is oriented to person, place, and time. She appears well-developed and well-nourished. No distress.  HENT:  Head: Normocephalic and atraumatic.  Eyes: EOM are normal. No scleral icterus.  Cardiovascular: Normal rate and regular rhythm.        Heart rate recheck= 80  Pulmonary/Chest: Effort normal. No respiratory distress.  Neurological: She is alert and oriented to person, place, and time.  Psychiatric: She has a normal mood and affect. Her behavior is normal. Thought content normal.       Pt was smiling today and relaxed while conversing with provider and staff          Assessment & Plan:   1. Depression      Continue current dose of Fluoxitine  2. Diabetic neuropathy, painful        RF: Hydrocodone- APAP to be taken hs as needed

## 2011-10-22 ENCOUNTER — Encounter: Payer: Self-pay | Admitting: Family Medicine

## 2011-10-23 ENCOUNTER — Telehealth: Payer: Self-pay

## 2011-10-23 NOTE — Telephone Encounter (Signed)
.  [facility] The patient called requesting that Dr. Audria Nine write a letter documenting her clinical depression.  Please call patient at [phone].

## 2011-11-03 ENCOUNTER — Encounter: Payer: Self-pay | Admitting: Family Medicine

## 2011-11-03 NOTE — Telephone Encounter (Signed)
LMOM that letter is ready for p/up. Letter printed and put in drawer

## 2011-11-03 NOTE — Telephone Encounter (Signed)
Letter has been done and pt can be contacted to pick it up at [facility].

## 2011-11-11 ENCOUNTER — Ambulatory Visit: Payer: 59 | Admitting: Family Medicine

## 2011-11-12 HISTORY — PX: RETINAL DETACHMENT REPAIR W/ SCLERAL BUCKLE LE: SHX2338

## 2011-11-25 ENCOUNTER — Encounter: Payer: Self-pay | Admitting: Family Medicine

## 2011-11-25 ENCOUNTER — Ambulatory Visit (INDEPENDENT_AMBULATORY_CARE_PROVIDER_SITE_OTHER): Payer: 59 | Admitting: Family Medicine

## 2011-11-25 VITALS — BP 131/86 | HR 84 | Temp 97.0°F | Resp 16 | Ht 64.0 in | Wt 186.4 lb

## 2011-11-25 DIAGNOSIS — F418 Other specified anxiety disorders: Secondary | ICD-10-CM

## 2011-11-25 DIAGNOSIS — F329 Major depressive disorder, single episode, unspecified: Secondary | ICD-10-CM

## 2011-11-25 DIAGNOSIS — E1140 Type 2 diabetes mellitus with diabetic neuropathy, unspecified: Secondary | ICD-10-CM

## 2011-11-25 DIAGNOSIS — E1149 Type 2 diabetes mellitus with other diabetic neurological complication: Secondary | ICD-10-CM

## 2011-11-25 DIAGNOSIS — H3322 Serous retinal detachment, left eye: Secondary | ICD-10-CM | POA: Insufficient documentation

## 2011-11-25 MED ORDER — HYDROCODONE-ACETAMINOPHEN 5-500 MG PO TABS: ORAL_TABLET | ORAL | Status: DC

## 2011-11-25 NOTE — Progress Notes (Signed)
  Subjective:    Patient ID: Oyindamola Key, female    DOB: 12/05/1955, 56 y.o.   MRN: [id number]  HPI   56 y.o. AA female returns today for recheck; Depression is stable but still has "good" days  and some "cloudy" days. Recently had surgery for detached retina- f/u appt is 12/09/11. Not able to   even consider employment at this time; unemployment benefits run out end of this month. She has  started to consider Disability. Needs RF for Vicodin (takes it at bedtime for neuropathic pain which  is most problematic at night).     Review of Systems As per HPI     Objective:   Physical Exam  Vitals reviewed. Constitutional: She is oriented to person, place, and time. She appears well-developed and well-nourished. No distress.  HENT:  Head: Normocephalic and atraumatic.  Eyes:       Left eye conj is red medially and lid is swollen  Cardiovascular: Normal rate.   Pulmonary/Chest: Effort normal. No respiratory distress.  Neurological: She is alert and oriented to person, place, and time. No cranial nerve deficit.  Psychiatric:       Flat affect          Assessment & Plan:   1. Diabetic neuropathy, painful    RF: Vicodin (generic)  #30 with 1 RF  2. Depression -stable; pt remains optimistic. Advised seeking services for Visually Impaired and getting documentation from Ophthalmologist re: current disability

## 2011-12-21 DIAGNOSIS — Z0271 Encounter for disability determination: Secondary | ICD-10-CM

## 2012-02-02 ENCOUNTER — Other Ambulatory Visit: Payer: Self-pay | Admitting: Family Medicine

## 2012-02-25 ENCOUNTER — Ambulatory Visit: Payer: 59 | Admitting: Family Medicine

## 2012-03-15 ENCOUNTER — Ambulatory Visit (INDEPENDENT_AMBULATORY_CARE_PROVIDER_SITE_OTHER): Payer: 59 | Admitting: Family Medicine

## 2012-03-15 ENCOUNTER — Encounter: Payer: Self-pay | Admitting: Family Medicine

## 2012-03-15 VITALS — BP 136/93 | HR 105 | Temp 97.5°F | Resp 16 | Ht 64.5 in | Wt 185.0 lb

## 2012-03-15 DIAGNOSIS — E119 Type 2 diabetes mellitus without complications: Secondary | ICD-10-CM

## 2012-03-15 DIAGNOSIS — F341 Dysthymic disorder: Secondary | ICD-10-CM

## 2012-03-15 DIAGNOSIS — E039 Hypothyroidism, unspecified: Secondary | ICD-10-CM

## 2012-03-15 DIAGNOSIS — F418 Other specified anxiety disorders: Secondary | ICD-10-CM

## 2012-03-15 LAB — COMPREHENSIVE METABOLIC PANEL
ALT: 37 U/L — ABNORMAL HIGH (ref 0–35)
AST: 27 U/L (ref 0–37)
CO2: 24 mEq/L (ref 19–32)
Calcium: 9.8 mg/dL (ref 8.4–10.5)
Chloride: 104 mEq/L (ref 96–112)
Sodium: 142 mEq/L (ref 135–145)
Total Protein: 7.4 g/dL (ref 6.0–8.3)

## 2012-03-15 LAB — TSH: TSH: 2.066 u[IU]/mL (ref 0.350–4.500)

## 2012-03-15 MED ORDER — AMITRIPTYLINE HCL 100 MG PO TABS: 100.0000 mg | ORAL_TABLET | Freq: Every day | ORAL | Status: DC

## 2012-03-15 MED ORDER — BUSPIRONE HCL 10 MG PO TABS: ORAL_TABLET | ORAL | Status: DC

## 2012-03-15 MED ORDER — LEVOTHYROXINE SODIUM 50 MCG PO TABS: 50.0000 ug | ORAL_TABLET | Freq: Every day | ORAL | Status: DC

## 2012-03-15 MED ORDER — LISINOPRIL-HYDROCHLOROTHIAZIDE 20-12.5 MG PO TABS: 1.0000 | ORAL_TABLET | Freq: Every day | ORAL | Status: DC

## 2012-03-15 NOTE — Patient Instructions (Addendum)
I am not going to change any of your medications at this time. Bring your medications to your next visit. I think that you might consider counseling to help with anxiety.

## 2012-03-18 ENCOUNTER — Encounter: Payer: Self-pay | Admitting: Family Medicine

## 2012-03-18 NOTE — Progress Notes (Signed)
Quick Note:  Please notify pt that results are normal.   Provide pt with copy of labs. ______ 

## 2012-03-18 NOTE — Progress Notes (Signed)
S: This 56 y.o. AA female returns for follow-up re: DM with neuropathy. She is compliant with medications and denies hypoglycemia. Amitriptyline hs helps with sleep; still taking Fluoxetine 20 mg but only once a day.  Phenytoin 50 mg 3x/day has been prescribed to reduce foot pain. She has depression with insomnia associated  with neuropathy as well as inability to work full time. She has left eye surgery in May to repair a detached retina; vision has not  returned to normal- "I still have trouble with this eye".  Still feels depressed about inability to work; she states she has no energy to job hunt , trouble with concentration and focus and cannot even read applications to complete employment process. She has been denied SSI and has a meeting scheduled with a Clinical research associate.  ROS: As per HPI: denies diaphoresis/fever/chills, unexplained weight change, CP or tightness, SOB or sough, GI upset with medications, dizziness or syncope. Pt has ongoing sleep disorder and depression (is taking Fluoxetine 20 mg daily);  no HI/SI.  O:  Filed Vitals:   03/15/12 1409  BP: 136/93  Pulse: 105  Temp: 97.5 F (36.4 C)  Resp: 16   GEN: In NAD; appears depressed and on verge of tears HENT: [place]/AT: Left eye has injected conjunctiva. COR: RRR LUNGS: Normal resp rate and effort. NEURO: A&O x 3; CNs grossly normal. Mentation- flat affect and depressed mood with mild lability.  A1C= 6.9%   A/P: 1. Unspecified hypothyroidism  TSH Continue current dose of Levothyroxine.  2. Type II or unspecified type diabetes mellitus without mention of complication, not stated as uncontrolled  POCT glycosylated hemoglobin (Hb A1C) -stable on current medication- no change.  Comprehensive metabolic panel  3. Depression with anxiety -asked pt to consider counseling but she cannot afford it at this time. Continue Fluoxetine 20 mg 1 tablet twice a day. Continue bedtime Amitriptyline   Letter given to pt re: inability to return to  gainful employment.

## 2012-05-13 ENCOUNTER — Encounter: Payer: Self-pay | Admitting: Family Medicine

## 2012-05-13 ENCOUNTER — Ambulatory Visit (INDEPENDENT_AMBULATORY_CARE_PROVIDER_SITE_OTHER): Payer: 59 | Admitting: Family Medicine

## 2012-05-13 VITALS — BP 138/92 | HR 85 | Temp 98.5°F | Resp 16 | Ht 64.0 in | Wt 184.4 lb

## 2012-05-13 DIAGNOSIS — E1149 Type 2 diabetes mellitus with other diabetic neurological complication: Secondary | ICD-10-CM

## 2012-05-13 DIAGNOSIS — R5383 Other fatigue: Secondary | ICD-10-CM

## 2012-05-13 DIAGNOSIS — E039 Hypothyroidism, unspecified: Secondary | ICD-10-CM

## 2012-05-13 DIAGNOSIS — E1140 Type 2 diabetes mellitus with diabetic neuropathy, unspecified: Secondary | ICD-10-CM

## 2012-05-13 DIAGNOSIS — Z23 Encounter for immunization: Secondary | ICD-10-CM

## 2012-05-13 DIAGNOSIS — F329 Major depressive disorder, single episode, unspecified: Secondary | ICD-10-CM

## 2012-05-13 DIAGNOSIS — R5381 Other malaise: Secondary | ICD-10-CM

## 2012-05-13 DIAGNOSIS — I10 Essential (primary) hypertension: Secondary | ICD-10-CM

## 2012-05-13 LAB — CBC WITH DIFFERENTIAL/PLATELET
Basophils Absolute: 0 10*3/uL (ref 0.0–0.1)
Basophils Relative: 0 % (ref 0–1)
Eosinophils Relative: 3 % (ref 0–5)
Lymphocytes Relative: 28 % (ref 12–46)
MCHC: 34.6 g/dL (ref 30.0–36.0)
MCV: 86.5 fL (ref 78.0–100.0)
Platelets: 272 10*3/uL (ref 150–400)
RDW: 14.2 % (ref 11.5–15.5)
WBC: 7.7 10*3/uL (ref 4.0–10.5)

## 2012-05-13 MED ORDER — METFORMIN HCL 500 MG PO TABS: ORAL_TABLET | ORAL | Status: DC

## 2012-05-13 MED ORDER — FLUOXETINE HCL 20 MG PO CAPS: 20.0000 mg | ORAL_CAPSULE | Freq: Two times a day (BID) | ORAL | Status: DC

## 2012-05-13 MED ORDER — HYDROCODONE-ACETAMINOPHEN 5-500 MG PO TABS: ORAL_TABLET | ORAL | Status: DC

## 2012-05-13 NOTE — Patient Instructions (Addendum)
There are no other recommendations I can make regarding treatment for pain and neuropathy until I have your lab results back. Follow up with your Pain Management physician. I will see you again in 3 months or sooner if any of your labs are significantly abnormal. Continue your current medications; I have refilled your pain medication which you can take once a day.

## 2012-05-13 NOTE — Progress Notes (Signed)
S:  This 56 y.o. AA female has controlled Diabetes but has chronic persistent peripheral neuropathy. She sees a Pain Management specialist and is scheduled to see him next month. She called his office recently for advise about pain that awakens her from sleep; when he stated that he wanted to change one of her medications, she told him she "didn't want to hear about medication change". Pt's daughter states pt is concerned about liver damage that may occur related to too many medications. Pt takes evening meds (~ 8 PM) and is "knocked out" until she is awaken with pain at 5 AM. Feet hurt and burn and get very red such that pt is in tears. She has tried some topical analgesic from her native country but got little relief.  ROS: As per HPI. Appetite remains good. Pt denies diaphoresis or fever/ chills, sore throat or trouble swallowing, CP or tightness, palpitations, SOB or cough, myalgias or new arthralgias; GI/GU problems, dizziness, weakness or syncope.   O:  Filed Vitals:   05/13/12 1250  BP: 138/92  Pulse: 85  Temp: 98.5 F (36.9 C)  Resp: 16   GEN: In NAD: WN,WD. Anxious when describing foot pain. HENT: [place]/AT. EOMI w/ clear conj/scl (left eye only slightly injected). Oral mucosa moist. COR: RRR. No edema. LUNGS: Normal resp rate and effort. MS: FROM in all major joints. No calf tenderness. EXT: Feet- W&D. No lesions, rashes, ulcerations or deformities. Pulses 2+/=.  NEURO: A&O x 3; CNs intact. Nonfocal. Mentation- tearful with flat affect.  A/P:  1. Diabetic neuropathy  Continue current medications and f/u with Pain Mgmt. RX: HC-APAP 5-500 mg  #30 with 1 refill  2. HTN (hypertension)  Continue Lisinopril-HCTZ at current dose Monitor DBP  3. Hypothyroidism  TSH, T4, Free  4. Depression - on Fluoxetine 40 mg/day Vitamin D, 25-hydroxy  5. Fatigue  Hepatic Function Panel, CBC with Differential  Vitamin D, 25-hydroxy  6. Need for prophylactic vaccination and inoculation against influenza   Flu vaccine greater than or equal to 3yo preservative free IM

## 2012-05-14 LAB — HEPATIC FUNCTION PANEL
ALT: 50 U/L — ABNORMAL HIGH (ref 0–35)
AST: 29 U/L (ref 0–37)
Albumin: 4.4 g/dL (ref 3.5–5.2)
Alkaline Phosphatase: 129 U/L — ABNORMAL HIGH (ref 39–117)
Bilirubin, Direct: <0.1 mg/dL (ref 0.0–0.3)

## 2012-05-16 NOTE — Progress Notes (Signed)
Quick Note:  Please call pt and advise that the following labs are abnormal..  Liver function tests a little above normal; will need to monitor and recheck at next visit. Vitamin D level is low-normal; you need to get an over-the-counter supplement Vitamin D 3 1000-2000 IU and take 1 capsule daily to get your level up to ~50.  This will take several months to achieve; also need to eat more foods that have Vitamin D: salmon, tuna, sardines, eggs, mushrooms, dairy products. Need to get 10-15 minutes of sun exposure most days of the week (weather permitting).   Thyroid tests and blood counts are normal.  Copy to pt. ______

## 2012-08-12 ENCOUNTER — Ambulatory Visit: Payer: 59 | Admitting: Family Medicine

## 2012-08-19 ENCOUNTER — Encounter: Payer: Self-pay | Admitting: Family Medicine

## 2012-08-19 ENCOUNTER — Ambulatory Visit (INDEPENDENT_AMBULATORY_CARE_PROVIDER_SITE_OTHER): Payer: BC Managed Care – PPO | Admitting: Family Medicine

## 2012-08-19 VITALS — BP 151/94 | HR 100 | Temp 98.0°F | Resp 16 | Ht 64.5 in | Wt 184.0 lb

## 2012-08-19 DIAGNOSIS — E1165 Type 2 diabetes mellitus with hyperglycemia: Secondary | ICD-10-CM

## 2012-08-19 DIAGNOSIS — E118 Type 2 diabetes mellitus with unspecified complications: Secondary | ICD-10-CM

## 2012-08-19 DIAGNOSIS — Z13220 Encounter for screening for lipoid disorders: Secondary | ICD-10-CM

## 2012-08-19 DIAGNOSIS — F418 Other specified anxiety disorders: Secondary | ICD-10-CM

## 2012-08-19 DIAGNOSIS — R7989 Other specified abnormal findings of blood chemistry: Secondary | ICD-10-CM

## 2012-08-19 DIAGNOSIS — I10 Essential (primary) hypertension: Secondary | ICD-10-CM

## 2012-08-19 LAB — LIPID PANEL
Cholesterol: 287 mg/dL — ABNORMAL HIGH (ref 0–200)
HDL: 42 mg/dL (ref >39)
Total CHOL/HDL Ratio: 6.8 Ratio
Triglycerides: 651 mg/dL — ABNORMAL HIGH (ref <150)

## 2012-08-19 LAB — COMPREHENSIVE METABOLIC PANEL
Alkaline Phosphatase: 81 U/L (ref 39–117)
BUN: 13 mg/dL (ref 6–23)
CO2: 26 mEq/L (ref 19–32)
Creat: 1.23 mg/dL — ABNORMAL HIGH (ref 0.50–1.10)
Glucose, Bld: 118 mg/dL — ABNORMAL HIGH (ref 70–99)
Total Bilirubin: 0.2 mg/dL — ABNORMAL LOW (ref 0.3–1.2)
Total Protein: 6.9 g/dL (ref 6.0–8.3)

## 2012-08-19 MED ORDER — LISINOPRIL-HYDROCHLOROTHIAZIDE 20-12.5 MG PO TABS: 1.0000 | ORAL_TABLET | Freq: Every day | ORAL | Status: DC

## 2012-08-19 MED ORDER — LEVOTHYROXINE SODIUM 50 MCG PO TABS: 50.0000 ug | ORAL_TABLET | Freq: Every day | ORAL | Status: DC

## 2012-08-19 MED ORDER — AMITRIPTYLINE HCL 100 MG PO TABS: 100.0000 mg | ORAL_TABLET | Freq: Every day | ORAL | Status: DC

## 2012-08-19 MED ORDER — HYDROCODONE-ACETAMINOPHEN 5-325 MG PO TABS: ORAL_TABLET | ORAL | Status: DC

## 2012-08-19 MED ORDER — FLUOXETINE HCL 20 MG PO CAPS: 20.0000 mg | ORAL_CAPSULE | Freq: Two times a day (BID) | ORAL | Status: DC

## 2012-08-19 NOTE — Progress Notes (Signed)
S:  Pt is a 57 y.o. AA female here with daughter Grover Canavan for DM follow-up and med review. Pt is not always compliant w/ medications, saying she forgets sometimes and is tired of taking "all these pills". Recent f/u with Eye Specialist shows she is recovering nicely from retinal surgery last year. She will have new prescription lenses made once vision has stabilized.  Pt continues to have neuropathic pain in both feet; sleep is interrupted because of this and current medications are moderately effective. Foot care/ skin care is good and pt has not new lesions to report. She does tend to wear inappropriate footwear.  ROS: As per HPI; negative for abnormal weight loss or gain, new vision disturbances, CP or tightness, palpitations, SOB or DOE, cough, myalgias or generalized weakness, HA, dizziness, slurred speech, tremor, unilateral numbness or syncope.  O:  Filed Vitals:   08/19/12 0947  BP: 151/94  Pulse: 100  Temp: 98 F (36.7 C)  Resp: 16   GEN: In NAD; WN,WD. HENT: [place]/AT; EOMI w/ clear conj and mildly muddy sclerae. Nose is w/o deformity. Oroph is clear and moist. COR: RRR.  LUNGS: Normal resp rate and effort. MS: MAEs w/o c/c/e. SKIN: W&D. Feet- no lesions but decreased sensation to light touch. Se DM foot exam. NEURO: A&O x 3; CNs intact. Gait is normal. Mentation- flat affect and sad mood; speech and behavior are appropriate.  A1c= 6.4%  A/P: Type II or unspecified type diabetes mellitus with unspecified complication, uncontrolled - stable on current medication. No change. Continue to encourage good nutrition and more activity.  Elevated LFTs - Plan: Comprehensive metabolic panel  Depression with anxiety- continue Fluoxetine.  Screening for hyperlipidemia - Plan: Lipid panel  Unspecified essential hypertension    Meds ordered this encounter  Medications  . lisinopril-hydrochlorothiazide (PRINZIDE,ZESTORETIC) 20-12.5 MG per tablet    Sig: Take 1 tablet by mouth daily.   Dispense:  30 tablet    Refill:  5  . levothyroxine (SYNTHROID, LEVOTHROID) 50 MCG tablet    Sig: Take 1 tablet (50 mcg total) by mouth daily.    Dispense:  30 tablet    Refill:  5  . FLUoxetine (PROZAC) 20 MG capsule    Sig: Take 1 capsule (20 mg total) by mouth 2 (two) times daily.    Dispense:  60 capsule    Refill:  5  . amitriptyline (ELAVIL) 100 MG tablet    Sig: Take 1 tablet (100 mg total) by mouth at bedtime.    Dispense:  30 tablet    Refill:  5  . HYDROcodone-acetaminophen (NORCO) 5-325 MG per tablet    Sig: Take 1 tablet in evening prn foot pain.    Dispense:  30 tablet    Refill:  1    Do not reill before October 17, 2012.    Pt given RX for topical compound to be prepared at pharmacy: FBCGL Compound for Diabetic neuropathy               Flurbiprofen  20% + Baclofen 4% + Cyclobenzaprine 2% + Gabapentin 6% + Lidocaine %                Quantity sufficient for 2.5 ml to each foot hs x 1 month with 1 refill.

## 2012-08-19 NOTE — Patient Instructions (Signed)
I have given you a prescription for a compound cream for pain that can be applied to your feet at bedtime. Take the prescription to [facility] at [address] The cost of the cream is not known until it is entered into the computer; you will have to pay out of pocket for this and submit a form to your insurer for reimbursement.  If this cost too much for you, have the pharmacy contact us if you are interested in a different compound.

## 2012-08-21 ENCOUNTER — Other Ambulatory Visit: Payer: Self-pay | Admitting: Family Medicine

## 2012-08-21 MED ORDER — FENOFIBRATE 145 MG PO TABS: 145.0000 mg | ORAL_TABLET | Freq: Every day | ORAL | Status: DC

## 2012-08-21 NOTE — Progress Notes (Signed)
Quick Note:  Please contact pt and advise that the following labs are abnormal... Kidney test is not a good as it was 6 months ago. Drink plenty of water every day and eat as healthy as you can; limit the amount of protein (especially red meat and chicken) in your diet.  Lipid panel shows high cholesterol and triglycerides; I am prescribing a medication to help lower these numbers. Healthy nutrition is also very important.  These labs will be rechecked at your next appointment.  Copy to pt. ______

## 2012-12-15 ENCOUNTER — Ambulatory Visit (INDEPENDENT_AMBULATORY_CARE_PROVIDER_SITE_OTHER): Payer: BC Managed Care – PPO | Admitting: Family Medicine

## 2012-12-15 ENCOUNTER — Encounter: Payer: Self-pay | Admitting: Family Medicine

## 2012-12-15 VITALS — BP 122/84 | HR 90 | Temp 98.3°F | Resp 16 | Ht 64.5 in | Wt 174.0 lb

## 2012-12-15 DIAGNOSIS — R82998 Other abnormal findings in urine: Secondary | ICD-10-CM

## 2012-12-15 DIAGNOSIS — E039 Hypothyroidism, unspecified: Secondary | ICD-10-CM

## 2012-12-15 DIAGNOSIS — K5289 Other specified noninfective gastroenteritis and colitis: Secondary | ICD-10-CM

## 2012-12-15 DIAGNOSIS — R5381 Other malaise: Secondary | ICD-10-CM

## 2012-12-15 DIAGNOSIS — K529 Noninfective gastroenteritis and colitis, unspecified: Secondary | ICD-10-CM

## 2012-12-15 DIAGNOSIS — R5383 Other fatigue: Secondary | ICD-10-CM

## 2012-12-15 DIAGNOSIS — Z5181 Encounter for therapeutic drug level monitoring: Secondary | ICD-10-CM

## 2012-12-15 DIAGNOSIS — R8290 Unspecified abnormal findings in urine: Secondary | ICD-10-CM

## 2012-12-15 DIAGNOSIS — R7989 Other specified abnormal findings of blood chemistry: Secondary | ICD-10-CM

## 2012-12-15 LAB — CBC WITH DIFFERENTIAL/PLATELET
Eosinophils Absolute: 0.3 10*3/uL (ref 0.0–0.7)
Hemoglobin: 11.4 g/dL — ABNORMAL LOW (ref 12.0–15.0)
Lymphocytes Relative: 30 % (ref 12–46)
Lymphs Abs: 1.6 10*3/uL (ref 0.7–4.0)
MCH: 30.3 pg (ref 26.0–34.0)
MCV: 84.6 fL (ref 78.0–100.0)
Monocytes Relative: 7 % (ref 3–12)
Neutrophils Relative %: 58 % (ref 43–77)
RBC: 3.76 MIL/uL — ABNORMAL LOW (ref 3.87–5.11)
WBC: 5.4 10*3/uL (ref 4.0–10.5)

## 2012-12-15 LAB — COMPREHENSIVE METABOLIC PANEL
ALT: 13 U/L (ref 0–35)
Albumin: 4.3 g/dL (ref 3.5–5.2)
CO2: 26 mEq/L (ref 19–32)
Calcium: 9.5 mg/dL (ref 8.4–10.5)
Chloride: 102 mEq/L (ref 96–112)
Glucose, Bld: 126 mg/dL — ABNORMAL HIGH (ref 70–99)
Sodium: 139 mEq/L (ref 135–145)
Total Bilirubin: 0.2 mg/dL — ABNORMAL LOW (ref 0.3–1.2)
Total Protein: 7 g/dL (ref 6.0–8.3)

## 2012-12-15 LAB — POCT URINALYSIS DIPSTICK
Bilirubin, UA: NEGATIVE
Blood, UA: NEGATIVE
Glucose, UA: NEGATIVE
Ketones, UA: NEGATIVE
Spec Grav, UA: 1.01
Urobilinogen, UA: 0.2

## 2012-12-15 LAB — TSH: TSH: 0.739 u[IU]/mL (ref 0.350–4.500)

## 2012-12-15 MED ORDER — HYDROCODONE-ACETAMINOPHEN 5-325 MG PO TABS: ORAL_TABLET | ORAL | Status: DC

## 2012-12-15 NOTE — Patient Instructions (Signed)
Colitis °Colitis is inflammation of the colon. Colitis can be a short-term or long-standing (chronic) illness. Crohn's disease and ulcerative colitis are 2 types of colitis which are chronic. They usually require lifelong treatment. °CAUSES  °There are many different causes of colitis, including: °· Viruses. °· Germs (bacteria). °· Medicine reactions. °SYMPTOMS  °· Diarrhea. °· Intestinal bleeding. °· Pain. °· Fever. °· Throwing up (vomiting). °· Tiredness (fatigue). °· Weight loss. °· Bowel blockage. °DIAGNOSIS  °The diagnosis of colitis is based on examination and stool or blood tests. X-rays, CT scan, and colonoscopy may also be needed. °TREATMENT  °Treatment may include: °· Fluids given through the vein (intravenously). °· Bowel rest (nothing to eat or drink for a period of time). °· Medicine for pain and diarrhea. °· Medicines (antibiotics) that kill germs. °· Cortisone medicines. °· Surgery. °HOME CARE INSTRUCTIONS  °· Get plenty of rest. °· Drink enough water and fluids to keep your urine clear or pale yellow. °· Eat a well-balanced diet. °· Call your caregiver for follow-up as recommended. °SEEK IMMEDIATE MEDICAL CARE IF:  °· You develop chills. °· You have an oral temperature above 102° F (38.9° C), not controlled by medicine. °· You have extreme weakness, fainting, or dehydration. °· You have repeated vomiting. °· You develop severe belly (abdominal) pain or are passing bloody or tarry stools. °MAKE SURE YOU:  °· Understand these instructions. °· Will watch your condition. °· Will get help right away if you are not doing well or get worse. °Document Released: 07/16/2004 Document Revised: 08/31/2011 Document Reviewed: 10/11/2009 °[employer]® Patient Information ©2014 [employer]. ° °

## 2012-12-15 NOTE — Progress Notes (Signed)
S:  This 57 y.o. female is here for follow-up after hospitalization at [facility] in [place] in May. She was diagnosed with colitis. New medications include: Ondansetron HCl and Pantoprazole per pt list. She was evaluated by a podiatrist and is now taking Tegretol (generic). She has decreased appetite, fatigue, dizziness and is pre-syncopal; she feels "awful" 3 days a week. She does not report diarrhea or constipation or blood in stools. She thinks it may be one of her medications. Nutrition consists of water/"vitamin" water, fruits and vegetables, no meats.  She cannot say w/ certainty if she has been advised to see a GI specialist. Pt has lost weight since last visit here in Feb 2014. Compliance with medications is good per pt.  (I reviewed pt's record in Care Everywhere- CT of abd/pelvis shows thickened bowel walls; labs and urine were abnormal).  PMHx, Soc Hx and Problem List reviewed.  ROS: As per HPI.  O: Filed Vitals:   12/15/12 1401  BP: 122/84                                       Weight decreased by 14 pounds since Feb 2014.  Pulse: 90  Temp: 98.3 F (36.8 C)  Resp: 16   GEN: In NAD; WN,WD. HEENT: [place]/AT; EOMI w/ clear conj/ sclerae. EACs/nose/ oroph normal. Oral mucosa moist w/ normal color. COR: RRR. No m/g/r. Normal S1,S2. LUNGS: CTA; no wheezes or rales. ABD: Normal appearance, soft and slightly distended. No guarding. Mild diffuse tenderness w/o rebound. No masses.  SKIN: W&D. No rashes. NEURO: A&O x 3; CNs intact. Nonfocal.  PSYCH: Pleasant, calm demeanor; conversant and attentive. Somewhat flat affect. Speech pattern and thought content normal.   Results for orders placed in visit on 12/15/12  POCT URINALYSIS DIPSTICK      Result Value Range   Color, UA yellow     Clarity, UA cloudy     Glucose, UA neg     Bilirubin, UA neg     Ketones, UA neg     Spec Grav, UA 1.010     Blood, UA neg     pH, UA 6.0     Protein, UA neg     Urobilinogen, UA 0.2     Nitrite, UA neg     Leukocytes, UA small (1+)      A/P: Noninfectious gastroenteritis and colitis - Will discussPlan: Comprehensive metabolic panel  Other malaise and fatigue - May be medication interactions or side effects- i.e. Carbamazepine (Tegretol).  Plan: Comprehensive metabolic panel  Abnormal CBC -  Mild anemia detected during hospitalization.  Plan: CBC with Differential  Unspecified hypothyroidism - Stable; continue current medication dose.   Plan: TSH  Abnormal urine findings - Pt had UTI in May 2014.   Plan: POCT urinalysis dipstick  Medication monitoring encounter - Plan: Carbamazepine level, free  RTC in 2 weeks to discuss GI referral and review lab results.

## 2012-12-18 LAB — CARBAMAZEPINE, FREE AND TOTAL
Carbamazepine Metabolite: 1.6 ug/mL — ABNORMAL HIGH (ref 0.1–1.0)
Carbamazepine, Free: 1.3 ug/mL (ref 1.0–3.0)

## 2012-12-18 NOTE — Progress Notes (Signed)
Quick Note:  Please contact pt and advise that the following labs are abnormal... You are mildly anemic; try to eat more foods that are good sources of iron. Get a multivitamin for women over 50 and take it daily with a meal.  Kidney and liver functions are stable. Need to stay well hydrated to help maintain good kidney function and help your metabolism.  Carbamazepine levels are good but need to watched carefully. High levels of this medication can be the reason you are having dizziness. Take this medication as directed.  We can review all the labs when you come in for your next appointment in July.  Copy to pt. ______

## 2013-01-03 ENCOUNTER — Ambulatory Visit (INDEPENDENT_AMBULATORY_CARE_PROVIDER_SITE_OTHER): Payer: BC Managed Care – PPO | Admitting: Family Medicine

## 2013-01-03 ENCOUNTER — Encounter: Payer: Self-pay | Admitting: Family Medicine

## 2013-01-03 VITALS — BP 120/88 | HR 88 | Temp 98.3°F | Resp 16 | Ht 64.5 in | Wt 174.3 lb

## 2013-01-03 DIAGNOSIS — D649 Anemia, unspecified: Secondary | ICD-10-CM

## 2013-01-03 DIAGNOSIS — E1149 Type 2 diabetes mellitus with other diabetic neurological complication: Secondary | ICD-10-CM

## 2013-01-03 DIAGNOSIS — T887XXA Unspecified adverse effect of drug or medicament, initial encounter: Secondary | ICD-10-CM

## 2013-01-03 LAB — POCT GLYCOSYLATED HEMOGLOBIN (HGB A1C): Hemoglobin A1C: 6.7

## 2013-01-03 NOTE — Patient Instructions (Addendum)
ANEMIA- get an over-the-counter multivitamin (like One-A-Day for Women over 50 or Nature Made for Women over 50); take the vitamin once a day with a meal.  Carbamazepine side effect- dizziness.  You have agree to take this medication at midday and at bedtime. The burning and pain in your feet will still be reduced but you should have less dizziness.  Diabetes- Control is not as good as before. Try to eat as healthy as you can; pay attention to portion sizes and reduce intake of breads, potatoes, rice and pasta. Also avoid concentrated sweets.

## 2013-01-03 NOTE — Progress Notes (Signed)
S:  This 57 y.o. female has Type II DM w/ neurologic manifestations, particularly peripheral neuropathy. She takes Carbamazepine for the burning and foot pain; this medication has been very effective but causes morning grogginess.  Pt sometimes skips a dose so that she feels less dizziness. Otherwise she feels good; pt is taking only one Prozac tablet daily. She monitors blood sugars once a day; no reports of hypoglycemia per pt or daughter.  Patient Active Problem List   Diagnosis Date Noted  . Detached retina, left 11/25/2011  . Depression with anxiety 11/25/2011  . Neuropathy in diabetes 09/15/2011  . Insomnia, unspecified   . Hypothyroid   . DIABETES MELLITUS, TYPE II 08/26/2010  . HYPERTENSION 08/26/2010  . RECTAL PAIN 08/26/2010   PMHx, Soc Hx and Fam Hx reviewed.  ROS: As per HPI; negative for diaphoresis, CP or tightness, palpitations, edema, SOB or DOE, cough, myalgias/arthralgias, rashes, bruising, HA, weakness, unilateral numbness or ayncope. Mild sleep disturbance persists but no agitation or behavior changes.  O: Filed Vitals:   01/03/13 1438  BP: 120/88  Pulse: 88  Temp: 98.3 F (36.8 C)  Resp: 16   GEN: In NAD; WN,WD. HENT: [place]/AT; EOMI w/ clear conj and slightly muddy sclerae. EACs/nasal mucosa/oroph clear and moist. COR: RRR. LUNGS: Normal resp rate and effort. SKIN: W&D; no rashes or erythema. NEURO: A&O x 3; CNs intact. Motor intact. Sensory- diminished sensation. Gait is normal. PSYCH: Pleasant and calm demeanor. Speech is mildly pressured and pt is inattentive at times. Thought content is normal. Judgement is sound.   Results for orders placed in visit on 01/03/13  POCT GLYCOSYLATED HEMOGLOBIN (HGB A1C)      Result Value Range   Hemoglobin A1C 6.7       A/P: Unspecified adverse effect of unspecified drug, medicinal and biological substance- Reduce Carbamazapine 200mg  to 1 tab at midday and 1 tab at bedtime.  Type II or unspecified type diabetes  mellitus with neurological manifestations, not stated as uncontrolled - Plan:No medication changes; focus on better nutrition (less starches and smaller portions).  Try to be more active since feet are less painful.  Anemia, mild -  Pt advised to get OTC MVI for Women over 50  and take 1 daily; pt concerned about constipation with MVI containing iron. Advised increasing iron-containing foods in diet.

## 2013-01-08 ENCOUNTER — Other Ambulatory Visit: Payer: Self-pay | Admitting: Family Medicine

## 2013-01-10 ENCOUNTER — Telehealth: Payer: Self-pay

## 2013-01-10 NOTE — Telephone Encounter (Signed)
Victorino Dike is enrolling this patient in bcbs case study.  If she can assist in any way the rn can be reached at  [phone]  Ext [phone]

## 2013-01-12 ENCOUNTER — Other Ambulatory Visit: Payer: Self-pay

## 2013-01-12 MED ORDER — BLOOD GLUCOSE MONITOR KIT: PACK | Status: DC

## 2013-01-12 MED ORDER — GLUCOSE BLOOD VI STRP: ORAL_STRIP | Status: DC

## 2013-01-26 ENCOUNTER — Telehealth: Payer: Self-pay

## 2013-01-26 NOTE — Telephone Encounter (Signed)
She needs to find out from her insurance provider which test strips are covered. I have called her.

## 2013-01-26 NOTE — Telephone Encounter (Signed)
Patient states her glucose strips are $738. Patient wants something cheaper. Patient uses [facility] on [address] [phone].

## 2013-01-28 NOTE — Telephone Encounter (Signed)
Pt will call and see what her ins will cover.

## 2013-03-27 ENCOUNTER — Other Ambulatory Visit: Payer: Self-pay | Admitting: Family Medicine

## 2013-05-05 ENCOUNTER — Ambulatory Visit: Payer: BC Managed Care – PPO | Admitting: Family Medicine

## 2013-05-10 ENCOUNTER — Ambulatory Visit: Payer: BC Managed Care – PPO | Admitting: Family Medicine

## 2013-05-23 DIAGNOSIS — Z0271 Encounter for disability determination: Secondary | ICD-10-CM

## 2013-05-30 ENCOUNTER — Ambulatory Visit (INDEPENDENT_AMBULATORY_CARE_PROVIDER_SITE_OTHER): Payer: BC Managed Care – PPO | Admitting: Family Medicine

## 2013-05-30 ENCOUNTER — Encounter: Payer: Self-pay | Admitting: Family Medicine

## 2013-05-30 VITALS — BP 142/90 | HR 100 | Temp 98.8°F | Resp 16 | Ht 64.0 in | Wt 173.4 lb

## 2013-05-30 DIAGNOSIS — E785 Hyperlipidemia, unspecified: Secondary | ICD-10-CM

## 2013-05-30 DIAGNOSIS — F418 Other specified anxiety disorders: Secondary | ICD-10-CM

## 2013-05-30 DIAGNOSIS — Z23 Encounter for immunization: Secondary | ICD-10-CM

## 2013-05-30 DIAGNOSIS — F341 Dysthymic disorder: Secondary | ICD-10-CM

## 2013-05-30 DIAGNOSIS — E1169 Type 2 diabetes mellitus with other specified complication: Secondary | ICD-10-CM | POA: Insufficient documentation

## 2013-05-30 DIAGNOSIS — E119 Type 2 diabetes mellitus without complications: Secondary | ICD-10-CM

## 2013-05-30 DIAGNOSIS — E039 Hypothyroidism, unspecified: Secondary | ICD-10-CM

## 2013-05-30 LAB — LIPID PANEL
Cholesterol: 341 mg/dL — ABNORMAL HIGH (ref 0–200)
Triglycerides: 439 mg/dL — ABNORMAL HIGH (ref <150)

## 2013-05-30 LAB — POCT GLYCOSYLATED HEMOGLOBIN (HGB A1C): Hemoglobin A1C: 6.1

## 2013-05-30 MED ORDER — FLUOXETINE HCL 20 MG PO CAPS: 20.0000 mg | ORAL_CAPSULE | Freq: Two times a day (BID) | ORAL | Status: DC

## 2013-05-30 MED ORDER — METFORMIN HCL 500 MG PO TABS: ORAL_TABLET | ORAL | Status: DC

## 2013-05-30 MED ORDER — LEVOTHYROXINE SODIUM 50 MCG PO TABS: ORAL_TABLET | ORAL | Status: DC

## 2013-05-30 MED ORDER — LISINOPRIL-HYDROCHLOROTHIAZIDE 20-12.5 MG PO TABS: 1.0000 | ORAL_TABLET | Freq: Every day | ORAL | Status: DC

## 2013-05-30 NOTE — Progress Notes (Signed)
S:  This 57 y.o. AA female is here for DM follow-up; she is compliant with medications and FSBS have been 90-140. No hypoglycemia reported. Medications well tolerated. Pt has moderately severe peripheral neuropathy w/ 05/29/13 visit for labs and medication adjustment. Pt advised to decrease carbamazepine dose pending blood levels.  Depression - stable on current medications. Taking Fluoxetine 20 mg daily; prescribed 40 mg daily but pt conserving medication until refill authorized. Sleep hygiene is good; no reports of thoughts of self-harm, SI/HI.   Pt has dyslipidemia but not taking fenofibrate because of cost.  Patient Active Problem List   Diagnosis Date Noted  . Dyslipidemia associated with type 2 diabetes mellitus 05/30/2013  . Detached retina, left 11/25/2011  . Depression with anxiety 11/25/2011  . Neuropathy in diabetes 09/15/2011  . Insomnia, unspecified   . Unspecified hypothyroidism   . DIABETES MELLITUS, TYPE II 08/26/2010  . HYPERTENSION 08/26/2010  . RECTAL PAIN 08/26/2010   PMHx, Soc Hx and Fam Hx reviewed.  Medications reconciled.  O: Filed Vitals:   05/30/13 1536  BP: 142/90  Pulse: 100  Temp: 98.8 F (37.1 C)  Resp: 16   GEN: In NAD; WN,WD. HENT: [place]/AT; EOMI w/ clear conj/sclerae. Otherwise unremarkable. COR: RRR. LUNGS: Normal resp rate and effort. SKIN: W&D; intact w/o erythema or rashes. See DM foot exam. NEURO: A&O x 3; CNs intact. Nonfocal.  PSYCH: Pleasant and calm; conversant and attentive w/ good eye contact. Flat affect but mood appears brighter than in the past.   Results for orders placed in visit on 05/30/13  POCT GLYCOSYLATED HEMOGLOBIN (HGB A1C)      Result Value Range   Hemoglobin A1C 6.1     A/P: DIABETES MELLITUS, TYPE II - Stable and well controlled; continue current medications. Plan: HM Diabetes Foot Exam, POCT glycosylated hemoglobin (Hb A1C)  Depression with anxiety- Resume Fluoxetine 40 mg per day.  Hypothyroid - Plan: TSH, T3,  Free  Dyslipidemia associated with type 2 diabetes mellitus - Plan: Lipid panel  Need for prophylactic vaccination and inoculation against influenza - Plan: Flu Vaccine QUAD 36+ mos IM  Meds ordered this encounter  Medications  . FLUoxetine (PROZAC) 20 MG capsule    Sig: Take 1 capsule (20 mg total) by mouth 2 (two) times daily.    Dispense:  60 capsule    Refill:  5  . levothyroxine (SYNTHROID, LEVOTHROID) 50 MCG tablet    Sig: Take one tablet by mouth one time daily    Dispense:  30 tablet    Refill:  5  . lisinopril-hydrochlorothiazide (PRINZIDE,ZESTORETIC) 20-12.5 MG per tablet    Sig: Take 1 tablet by mouth daily.    Dispense:  30 tablet    Refill:  5  . metFORMIN (GLUCOPHAGE) 500 MG tablet    Sig: Take 1 tablet every AM with meal and 2 tablets every PM with meal.    Dispense:  90 tablet    Refill:  5

## 2013-05-31 ENCOUNTER — Encounter: Payer: Self-pay | Admitting: *Deleted

## 2013-05-31 ENCOUNTER — Telehealth: Payer: Self-pay | Admitting: Family Medicine

## 2013-05-31 MED ORDER — AMITRIPTYLINE HCL 100 MG PO TABS: ORAL_TABLET | ORAL | Status: DC

## 2013-05-31 MED ORDER — PRAVASTATIN SODIUM 40 MG PO TABS: 40.0000 mg | ORAL_TABLET | Freq: Every day | ORAL | Status: DC

## 2013-05-31 NOTE — Progress Notes (Signed)
Quick Note:  I called pt and discussed lab results with her.  Please mail her a copy of results.  ______

## 2013-05-31 NOTE — Telephone Encounter (Signed)
I called pt to discuss elevated lipid results. She is not taking Fenofibrate because of cost of medication. She is agreeable to trying statin; she has not taken this type of medication in the past.

## 2013-09-25 ENCOUNTER — Other Ambulatory Visit: Payer: Self-pay | Admitting: Family Medicine

## 2013-10-04 ENCOUNTER — Ambulatory Visit (INDEPENDENT_AMBULATORY_CARE_PROVIDER_SITE_OTHER): Payer: BC Managed Care – PPO | Admitting: Family Medicine

## 2013-10-04 ENCOUNTER — Encounter: Payer: Self-pay | Admitting: Family Medicine

## 2013-10-04 VITALS — BP 112/82 | HR 87 | Temp 97.9°F | Resp 16 | Ht 65.0 in | Wt 171.2 lb

## 2013-10-04 DIAGNOSIS — E119 Type 2 diabetes mellitus without complications: Secondary | ICD-10-CM

## 2013-10-04 DIAGNOSIS — G589 Mononeuropathy, unspecified: Secondary | ICD-10-CM

## 2013-10-04 DIAGNOSIS — E785 Hyperlipidemia, unspecified: Secondary | ICD-10-CM

## 2013-10-04 DIAGNOSIS — G629 Polyneuropathy, unspecified: Secondary | ICD-10-CM

## 2013-10-04 DIAGNOSIS — E1169 Type 2 diabetes mellitus with other specified complication: Secondary | ICD-10-CM

## 2013-10-04 DIAGNOSIS — F341 Dysthymic disorder: Secondary | ICD-10-CM

## 2013-10-04 DIAGNOSIS — F418 Other specified anxiety disorders: Secondary | ICD-10-CM

## 2013-10-04 LAB — COMPLETE METABOLIC PANEL WITH GFR
ALK PHOS: 70 U/L (ref 39–117)
ALT: 17 U/L (ref 0–35)
AST: 16 U/L (ref 0–37)
Albumin: 4.6 g/dL (ref 3.5–5.2)
BUN: 20 mg/dL (ref 6–23)
CALCIUM: 9.7 mg/dL (ref 8.4–10.5)
CHLORIDE: 102 meq/L (ref 96–112)
CO2: 25 mEq/L (ref 19–32)
Creat: 1.39 mg/dL — ABNORMAL HIGH (ref 0.50–1.10)
GFR, Est African American: 49 mL/min — ABNORMAL LOW
GFR, Est Non African American: 42 mL/min — ABNORMAL LOW
Glucose, Bld: 114 mg/dL — ABNORMAL HIGH (ref 70–99)
POTASSIUM: 4.2 meq/L (ref 3.5–5.3)
Sodium: 140 mEq/L (ref 135–145)
Total Bilirubin: 0.3 mg/dL (ref 0.2–1.2)
Total Protein: 7 g/dL (ref 6.0–8.3)

## 2013-10-04 LAB — POCT GLYCOSYLATED HEMOGLOBIN (HGB A1C): Hemoglobin A1C: 6.6

## 2013-10-04 LAB — LIPID PANEL
CHOL/HDL RATIO: 4.7 ratio
Cholesterol: 295 mg/dL — ABNORMAL HIGH (ref 0–200)
HDL: 63 mg/dL (ref >39)
LDL CALC: 168 mg/dL — AB (ref 0–99)
TRIGLYCERIDES: 320 mg/dL — AB (ref <150)
VLDL: 64 mg/dL — AB (ref 0–40)

## 2013-10-04 MED ORDER — CARBAMAZEPINE 200 MG PO TABS
200.0000 mg | ORAL_TABLET | Freq: Three times a day (TID) | ORAL | Status: DC
Start: 2013-10-04 — End: 2014-10-09

## 2013-10-04 MED ORDER — HYDROCODONE-ACETAMINOPHEN 5-325 MG PO TABS: ORAL_TABLET | ORAL | Status: DC

## 2013-10-04 NOTE — Progress Notes (Signed)
Subjective:    Patient ID: Caroline Simmons, female    DOB: Dec 01, 1955, 58 y.o.   MRN: [id number]  HPI  This 58 y.o. AA female has Type II DM; FSBS 100-160. Last A1c in Dec 2014= 6.1%. Not checking daily due to cost of glucometer strips. She does meal planning and stays physically active. Really committed to weight loss and Diabetes control. She sees a neurologist in [place], [place] for evaluation of neuropathy. EMG/NCV performed on 09/11/2013- results pending. Pt states her feet constantly burn and feels numb, making walking for extended periods difficult.  The medications do not seem to be very effective. Out-of-pocket cost for carbamazepaine is high. She is not experiencing dizziness as reported at last visit. No reports of hypoglycemia, excessive thirst or hunger.  Depression- pt taking antidepressant daily. Depression score today = 10. Mental state affected by foot discomfort and sleep disruption. No activities outside the home. Few friends. No suicidal ideation or paln for self harm. No behavior abnormalities, agitation or confusion.  Review of Systems  Constitutional: Negative.   Eyes: Negative.   Respiratory: Negative.  Negative for cough, chest tightness and shortness of breath.   Cardiovascular: Negative for chest pain, palpitations and leg swelling.  Genitourinary: Negative.   Musculoskeletal: Positive for gait problem. Negative for arthralgias, back pain and joint swelling.  Skin: Negative.   Otherwise as per HPI.     Objective:   Physical Exam  Nursing note and vitals reviewed. Constitutional: She is oriented to person, place, and time. She appears well-developed and well-nourished. No distress.  HENT:  Head: Normocephalic and atraumatic.  Right Ear: External ear normal.  Left Ear: External ear normal.  Nose: Nose normal.  Mouth/Throat: Oropharynx is clear and moist.  Eyes: EOM are normal. Pupils are equal, round, and reactive to light. No scleral icterus.  Neck: Neck  supple. No thyromegaly present.  Cardiovascular: Normal rate, regular rhythm and normal heart sounds.   Pulmonary/Chest: Effort normal and breath sounds normal. No respiratory distress.  Musculoskeletal: Normal range of motion. She exhibits tenderness. She exhibits no edema.  Feet have normal color and pulses; mild erythema and tender to touch. No calluses or ulceration.  Neurological: She is alert and oriented to person, place, and time. No cranial nerve deficit. She exhibits normal muscle tone. Coordination normal.  Skin: Skin is warm and dry. No rash noted. She is not diaphoretic. No erythema.  Psychiatric: Her speech is normal and behavior is normal. Judgment and thought content normal. Her mood appears not anxious. Her affect is not labile and not inappropriate. Cognition and memory are normal. She exhibits a depressed mood.    Results for orders placed in visit on 10/04/13  POCT GLYCOSYLATED HEMOGLOBIN (HGB A1C)      Result Value Ref Range   Hemoglobin A1C 6.6        Assessment & Plan:  Type II or unspecified type diabetes mellitus without mention of complication, not stated as uncontrolled - Stable but advised nutrition modification w/ carb reduction and limiting sweets fruits. Continue regular physical activty.  Plan: HM Diabetes Foot Exam, POCT glycosylated hemoglobin (Hb A1C)  Dyslipidemia associated with type 2 diabetes mellitus -  Plan: POCT glycosylated hemoglobin (Hb A1C), COMPLETE METABOLIC PANEL WITH GFR, Lipid panel  Neuropathy - Follow-up w/ specialist in [facility] for testing. Plan: Carbamazepine level, free  Depression with anxiety- Stable and improved on Fluoxetine and bedtime Amitriptyline.  Meds ordered this encounter  Medications  . carbamazepine (TEGRETOL) 200 MG tablet  Sig: Take 1 tablet (200 mg total) by mouth 3 (three) times daily.    Dispense:  90 tablet    Refill:  5  . DISCONTD: HYDROcodone-acetaminophen (NORCO) 5-325 MG per tablet    Sig: Take 1  tablet in evening prn foot pain.    Dispense:  30 tablet    Refill:  0  . HYDROcodone-acetaminophen (NORCO) 5-325 MG per tablet    Sig: Take 1 tablet in evening prn foot pain.    Dispense:  30 tablet    Refill:  0    May fill on or after Nov 03, 2013.

## 2013-10-07 ENCOUNTER — Telehealth: Payer: Self-pay | Admitting: Family Medicine

## 2013-10-07 LAB — CARBAMAZEPINE, FREE AND TOTAL
CARBAMAZEPINE METABOLITE -: 1.9 ug/mL (ref 0.2–2.0)
CARBAMAZEPINE, BOUND: 4.2 ug/mL
Carbamazepine Metabolite/: 0.9 ug/mL
Carbamazepine Metabolite: 1 ug/mL (ref 0.1–1.0)
Carbamazepine, Free: 0.9 ug/mL — ABNORMAL LOW (ref 1.0–3.0)
Carbamazepine, Total: 5.1 ug/mL (ref 4.0–12.0)

## 2013-10-07 NOTE — Telephone Encounter (Signed)
I phoned pt to discuss lab results all results llok good. I am concerned about kidney function so I want her to come back in 4-6 weeks to have lab (BMET) rechecked. In the meantime, drink plenty of healthy liquids and take medications as prescribed.

## 2013-10-07 NOTE — Progress Notes (Signed)
Quick Note:  Please advise pt regarding following labs... I phoned pt and left a message-  Tegretol (carbamazepine) level is normal. Continue this medication as prescribed. Kidney function not normal; will need to be checked in 4-6 weeks. Drink plenty of water and healthy liquids to help kidneys.  Cholesterol numbers are still high; will need to discuss changing medication- either have to increase Pravastatin dose or switch to more potent medication.  Pt advised to schedule visit: 4-6 weeks.  Copy to pt. ______

## 2013-11-09 ENCOUNTER — Ambulatory Visit: Payer: Self-pay | Admitting: Family Medicine

## 2013-12-25 DIAGNOSIS — G629 Polyneuropathy, unspecified: Secondary | ICD-10-CM | POA: Insufficient documentation

## 2013-12-25 DIAGNOSIS — R5383 Other fatigue: Secondary | ICD-10-CM | POA: Insufficient documentation

## 2014-01-20 ENCOUNTER — Other Ambulatory Visit: Payer: Self-pay | Admitting: Family Medicine

## 2014-02-14 ENCOUNTER — Encounter: Payer: Self-pay | Admitting: Family Medicine

## 2014-02-14 ENCOUNTER — Ambulatory Visit (INDEPENDENT_AMBULATORY_CARE_PROVIDER_SITE_OTHER): Payer: BC Managed Care – PPO | Admitting: Family Medicine

## 2014-02-14 VITALS — BP 146/92 | HR 99 | Temp 98.2°F | Resp 16 | Ht 64.25 in | Wt 171.2 lb

## 2014-02-14 DIAGNOSIS — E039 Hypothyroidism, unspecified: Secondary | ICD-10-CM

## 2014-02-14 DIAGNOSIS — E119 Type 2 diabetes mellitus without complications: Secondary | ICD-10-CM

## 2014-02-14 DIAGNOSIS — L237 Allergic contact dermatitis due to plants, except food: Secondary | ICD-10-CM

## 2014-02-14 DIAGNOSIS — Z0000 Encounter for general adult medical examination without abnormal findings: Secondary | ICD-10-CM

## 2014-02-14 DIAGNOSIS — L255 Unspecified contact dermatitis due to plants, except food: Secondary | ICD-10-CM

## 2014-02-14 LAB — POCT GLYCOSYLATED HEMOGLOBIN (HGB A1C): Hemoglobin A1C: 6.3

## 2014-02-14 MED ORDER — LEVOTHYROXINE SODIUM 50 MCG PO TABS: ORAL_TABLET | ORAL | Status: DC

## 2014-02-14 MED ORDER — METHYLPREDNISOLONE ACETATE 80 MG/ML IJ SUSP
120.0000 mg | Freq: Once | INTRAMUSCULAR | Status: AC
  Administered 2014-02-14: 120 mg via INTRAMUSCULAR

## 2014-02-14 MED ORDER — METHYLPREDNISOLONE ACETATE 80 MG/ML IJ SUSP: 120.0000 mg | Freq: Once | INTRAMUSCULAR | Status: DC

## 2014-02-14 MED ORDER — LISINOPRIL-HYDROCHLOROTHIAZIDE 20-12.5 MG PO TABS: 1.0000 | ORAL_TABLET | Freq: Every day | ORAL | Status: DC

## 2014-02-14 MED ORDER — PRAVASTATIN SODIUM 40 MG PO TABS: 40.0000 mg | ORAL_TABLET | Freq: Every day | ORAL | Status: DC

## 2014-02-14 NOTE — Progress Notes (Signed)
Subjective:    Patient ID: Caroline Simmons, female    DOB: [birth year], 58 y.o.   MRN: [id number]  HPI This 58 y.o. [place] female is here for annual CPE; she has Type II DM w/ neuropathy, HTN and hypothyroidism. She reports that she has been approved for [employer] benefits.  HCM: PAP/ MMG- per GYN specialist (Dr. Newton Pigg).           IMM- Current. Pt defers Prevar13 until next visit.           Vision- Pt to sch OV w/ specialist.           CRS-  Current (2012).  Patient Active Problem List   Diagnosis Date Noted  . Dyslipidemia associated with type 2 diabetes mellitus 05/30/2013  . Detached retina, left 11/25/2011  . Depression with anxiety 11/25/2011  . Neuropathy in diabetes 09/15/2011  . Insomnia, unspecified   . Unspecified hypothyroidism   . DIABETES MELLITUS, TYPE II 08/26/2010  . HYPERTENSION 08/26/2010  . RECTAL PAIN 08/26/2010    Prior to Admission medications   Medication Sig Start Date End Date Taking? Authorizing Provider  amitriptyline (ELAVIL) 100 MG tablet TAKE ONE TABLET BY MOUTH NIGHTLY AT BEDTIME  09/25/13  Yes Barton Fanny, MD  aspirin 81 MG tablet Take 81 mg by mouth daily.   Yes Historical Provider, MD  Blood Glucose Monitoring Suppl (BLOOD GLUCOSE MONITOR KIT) KIT Use to test blood sugar daily. 01/12/13  Yes Barton Fanny, MD  carbamazepine (TEGRETOL) 200 MG tablet Take 1 tablet (200 mg total) by mouth 3 (three) times daily. 10/04/13  Yes Barton Fanny, MD  HYDROcodone-acetaminophen (NORCO) 5-325 MG per tablet Take 1 tablet in evening prn foot pain. 10/04/13  Yes Barton Fanny, MD  [employer] BLOOD GLUCOSE TEST test strip USE TO TEST BLOOD SUGAR ONCE DAILY AS DIRECTED BY PHYSICIAN 01/08/13  Yes Barton Fanny, MD  levothyroxine (SYNTHROID, LEVOTHROID) 50 MCG tablet Take one tablet by mouth one time daily   Yes Barton Fanny, MD  lisinopril-hydrochlorothiazide (PRINZIDE,ZESTORETIC) 20-12.5 MG per tablet Take 1 tablet by mouth  daily.   Yes Barton Fanny, MD  metFORMIN (GLUCOPHAGE) 500 MG tablet TAKE ONE TABLETS BY MOUTH EVERY MORNING WITH A MEAL AND TWO IN THE EVENING WITH A MEAL   Yes Barton Fanny, MD  rOPINIRole (REQUIP) 0.25 MG tablet Take 0.5 mg by mouth. 12/25/13 12/25/14 Yes Historical Provider, MD  FLUoxetine (PROZAC) 20 MG capsule Take 1 capsule (20 mg total) by mouth 2 (two) times daily. 05/30/13   Barton Fanny, MD  glucose blood test strip Use to test blood sugar daily. 01/12/13   Barton Fanny, MD  ondansetron (ZOFRAN) 4 MG tablet Take 4 mg by mouth 6 (six) times daily. As needed for nausea    Historical Provider, MD  pantoprazole (PROTONIX) 40 MG tablet Take 40 mg by mouth daily.    Historical Provider, MD  pravastatin (PRAVACHOL) 40 MG tablet Take 1 tablet (40 mg total) by mouth daily. Take at bedtime.    Barton Fanny, MD    History   Social History  . Marital Status: Married    Spouse Name: N/A    Number of Children: N/A  . Years of Education: N/A   Occupational History  . Pt reports she has been approved for [employer].   Social History Main Topics  . Smoking status: Former Smoker -- 1.00 packs/day for 3 years  Types: Cigarettes  . Smokeless tobacco: Not on file     Comment: quit 08/2003  . Alcohol Use: Yes     Comment: rare-beer or wine  . Drug Use: No  . Sexual Activity: Not on file   Other Topics Concern  . Not on file    Family History  Problem Relation Age of Onset  . Diabetes    . Hypertension    . Diabetes Mother     type 2  not sure onset    Review of Systems  Constitutional: Negative.   HENT: Negative.   Eyes: Positive for visual disturbance.       Decreased vision in L eye; pt has sch OV w/ ophthalmologist. She has hx of detached retina.  Respiratory: Negative.   Cardiovascular: Negative.   Gastrointestinal: Negative.   Endocrine: Negative.   Genitourinary: Negative.   Musculoskeletal: Negative.   Skin: Positive for  rash.       Pt has poison iny rash; she was out on the golf course with family members, hunting down golf balls in the bushes and shrubbery. Calamine lotion has not calmed itching.  Allergic/Immunologic: Negative.   Neurological: Positive for dizziness and numbness.  Hematological: Negative.   Psychiatric/Behavioral: Positive for dysphoric mood.       Pt has chronic depression and is in counseling.      Objective:   Physical Exam  Nursing note and vitals reviewed. Constitutional: She is oriented to person, place, and time. Vital signs are normal. She appears well-developed and well-nourished. No distress.  HENT:  Head: Normocephalic and atraumatic.  Right Ear: Hearing, tympanic membrane, external ear and ear canal normal.  Left Ear: Hearing, tympanic membrane, external ear and ear canal normal.  Nose: Nose normal. No nasal deformity or septal deviation.  Mouth/Throat: Uvula is midline and oropharynx is clear and moist. No oral lesions. No dental caries.  Eyes: Conjunctivae, EOM and lids are normal. Pupils are equal, round, and reactive to light. No scleral icterus.  Fundoscopic exam:      The right eye shows no papilledema. The right eye shows red reflex.  Difficult to assess fundi.  Neck: Trachea normal, normal range of motion and full passive range of motion without pain. Neck supple. No JVD present. No spinous process tenderness and no muscular tenderness present. Carotid bruit is not present. No mass and no thyromegaly present.  Cardiovascular: Normal rate, regular rhythm, S1 normal, S2 normal, normal heart sounds, intact distal pulses and normal pulses.  PMI is not displaced.  Exam reveals no gallop and no friction rub.   No murmur heard. Pulmonary/Chest: Effort normal and breath sounds normal. No respiratory distress. She has no decreased breath sounds. She has no wheezes.  Breast exam deferred.  Abdominal: Soft. Normal appearance and bowel sounds are normal. She exhibits no  distension, no abdominal bruit, no pulsatile midline mass and no mass. There is no tenderness. There is no rebound, no guarding and no CVA tenderness.  Genitourinary:  GYN exam deferred.  Musculoskeletal:       Cervical back: Normal.       Thoracic back: Normal.       Lumbar back: Normal.  Remainder of exam unremarkable.  Lymphadenopathy:       Head (right side): No submental, no submandibular, no tonsillar, no preauricular, no posterior auricular and no occipital adenopathy present.       Head (left side): No submental, no submandibular, no tonsillar, no preauricular, no posterior auricular and no occipital  adenopathy present.    She has no cervical adenopathy.       Right: No supraclavicular adenopathy present.       Left: No supraclavicular adenopathy present.  Neurological: She is alert and oriented to person, place, and time. She has normal strength and normal reflexes. She displays no atrophy and no tremor. No cranial nerve deficit or sensory deficit. She exhibits normal muscle tone. She displays a negative Romberg sign. Coordination and gait normal.  Skin: Skin is warm, dry and intact. Rash noted. No ecchymosis noted. She is not diaphoretic. No cyanosis or erythema. Nails show no clubbing.  Erythematous papules w/ excoriations and macules on distal extremities.  Psychiatric: She has a normal mood and affect. Her speech is normal and behavior is normal. Judgment and thought content normal. Cognition and memory are normal.  Affect is somewhat flat.    Results for orders placed in visit on 02/14/14  POCT GLYCOSYLATED HEMOGLOBIN (HGB A1C)      Result Value Ref Range   Hemoglobin A1C 6.3         Assessment & Plan:  Routine general medical examination at a health care facility - Plan: IFOBT POC (occult bld, rslt in office)  DIABETES MELLITUS, TYPE II - Well controlled on current medication.Pt will try to increase exercise; activity is limited due to neuropathy. Plan: HM Diabetes Foot  Exam, POCT glycosylated hemoglobin (Hb H6Y), BASIC METABOLIC PANEL WITH GFR, Lipid panel, ALT  Poison ivy dermatitis - Plan: methylPREDNISolone acetate (DEPO-MEDROL) injection 120 mg, methylPREDNISolone acetate (DEPO-MEDROL) injection 120 mg; advised use of Caladryl topical available OTC.  Unspecified hypothyroidism - No medication change pending lab results. Plan: Thyroid Panel With TSH  Meds ordered this encounter  Medications  . methylPREDNISolone acetate (DEPO-MEDROL) injection 120 mg    Sig:   . levothyroxine (SYNTHROID, LEVOTHROID) 50 MCG tablet    Sig: Take one tablet by mouth one time daily    Dispense:  30 tablet    Refill:  5  . lisinopril-hydrochlorothiazide (PRINZIDE,ZESTORETIC) 20-12.5 MG per tablet    Sig: Take 1 tablet by mouth daily.    Dispense:  30 tablet    Refill:  5  . pravastatin (PRAVACHOL) 40 MG tablet    Sig: Take 1 tablet (40 mg total) by mouth daily. Take at bedtime.    Dispense:  90 tablet    Refill:  1   Pt will receive Prevnar13 at next visit.

## 2014-02-14 NOTE — Patient Instructions (Signed)
Keeping You Healthy  Get These Tests  Blood Pressure- Have your blood pressure checked by your healthcare provider at least once a year.  Normal blood pressure is 120/80.  Weight- Have your body mass index (BMI) calculated to screen for obesity.  BMI is a measure of body fat based on height and weight.  You can calculate your own BMI at www.nhlbisupport.com/bmi/  Cholesterol- Have your cholesterol checked every year.  Diabetes- Have your blood sugar checked every year if you have high blood pressure, high cholesterol, a family history of diabetes or if you are overweight.  Pap Smear- Have a pap smear every 1 to 3 years if you have been sexually active.  If you are older than 65 and recent pap smears have been normal you may not need additional pap smears.  In addition, if you have had a hysterectomy  For benign disease additional pap smears are not necessary.  Mammogram-Yearly mammograms are essential for early detection of breast cancer  Screening for Colon Cancer- Colonoscopy starting at age 50. Screening may begin sooner depending on your family history and other health conditions.  Follow up colonoscopy as directed by your Gastroenterologist.  Screening for Osteoporosis- Screening begins at age 65 with bone density scanning, sooner if you are at higher risk for developing Osteoporosis.  Get these medicines  Calcium with Vitamin D- Your body requires 1200-1500 mg of Calcium a day and 800-1000 IU of Vitamin D a day.  You can only absorb 500 mg of Calcium at a time therefore Calcium must be taken in 2 or 3 separate doses throughout the day.  Hormones- Hormone therapy has been associated with increased risk for certain cancers and heart disease.  Talk to your healthcare provider about if you need relief from menopausal symptoms.  Aspirin- Ask your healthcare provider about taking Aspirin to prevent Heart Disease and Stroke.  Get these Immuniztions  Flu shot- Every fall  Pneumonia  shot- Once after the age of 65; if you are younger ask your healthcare provider if you need a pneumonia shot.  Tetanus- Every ten years.  Zostavax- Once after the age of 60 to prevent shingles.  Take these steps  Don't smoke- Your healthcare provider can help you quit. For tips on how to quit, ask your healthcare provider or go to www.smokefree.gov or call 1-800 QUIT-NOW.  Be physically active- Exercise 5 days a week for a minimum of 30 minutes.  If you are not already physically active, start slow and gradually work up to 30 minutes of moderate physical activity.  Try walking, dancing, bike riding, swimming, etc.  Eat a healthy diet- Eat a variety of healthy foods such as fruits, vegetables, whole grains, low fat milk, low fat cheeses, yogurt, lean meats, chicken, fish, eggs, dried beans, tofu, etc.  For more information go to www.thenutritionsource.org  Dental visit- Brush and floss teeth twice daily; visit your dentist twice a year.  Eye exam- Visit your Optometrist or Ophthalmologist yearly.  Drink alcohol in moderation- Limit alcohol intake to one drink or less a day.  Never drink and drive.  Depression- Your emotional health is as important as your physical health.  If you're feeling down or losing interest in things you normally enjoy, please talk to your healthcare provider.  Seat Belts- can save your life; always wear one  Smoke/Carbon Monoxide detectors- These detectors need to be installed on the appropriate level of your home.  Replace batteries at least once a year.  Violence- If anyone   is threatening or hurting you, please tell your healthcare provider.  Living Will/ Health care power of attorney- Discuss with your healthcare provider and family.       Mediterranean Diet  Why follow it? Research shows.   Those who follow the Mediterranean diet have a reduced risk of heart disease    The diet is associated with a reduced incidence of Parkinson's and Alzheimer's  diseases   People following the diet may have longer life expectancies and lower rates of chronic diseases    The Dietary Guidelines for Americans recommends the Mediterranean diet as an eating plan to promote health and prevent disease  What Is the Mediterranean Diet?    Healthy eating plan based on typical foods and recipes of Mediterranean-style cooking   The diet is primarily a plant based diet; these foods should make up a majority of meals   Starches - Plant based foods should make up a majority of meals - They are an important sources of vitamins, minerals, energy, antioxidants, and fiber - Choose whole grains, foods high in fiber and minimally processed items  - Typical grain sources include wheat, oats, barley, corn, brown rice, bulgar, farro, millet, polenta, couscous  - Various types of beans include chickpeas, lentils, fava beans, black beans, white beans   Fruits  Veggies - Large quantities of antioxidant rich fruits & veggies; 6 or more servings  - Vegetables can be eaten raw or lightly drizzled with oil and cooked  - Vegetables common to the traditional Mediterranean Diet include: artichokes, arugula, beets, broccoli, brussel sprouts, cabbage, carrots, celery, collard greens, cucumbers, eggplant, kale, leeks, lemons, lettuce, mushrooms, okra, onions, peas, peppers, potatoes, pumpkin, radishes, rutabaga, shallots, spinach, sweet potatoes, turnips, zucchini - Fruits common to the Mediterranean Diet include: apples, apricots, avocados, cherries, clementines, dates, figs, grapefruits, grapes, melons, nectarines, oranges, peaches, pears, pomegranates, strawberries, tangerines  Fats - Replace butter and margarine with healthy oils, such as olive oil, canola oil, and tahini  - Limit nuts to no more than a handful a day  - Nuts include walnuts, almonds, pecans, pistachios, pine nuts  - Limit or avoid candied, honey roasted or heavily salted nuts - Olives are central to the Mediterranean  diet - can be eaten whole or used in a variety of dishes   Meats Protein - Limiting red meat: no more than a few times a month - When eating red meat: choose lean cuts and keep the portion to the size of deck of cards - Eggs: approx. 0 to 4 times a week  - Fish and lean poultry: at least 2 a week  - Healthy protein sources include, chicken, turkey, lean beef, lamb - Increase intake of seafood such as tuna, salmon, trout, mackerel, shrimp, scallops - Avoid or limit high fat processed meats such as sausage and bacon  Dairy - Include moderate amounts of low fat dairy products  - Focus on healthy dairy such as fat free yogurt, skim milk, low or reduced fat cheese - Limit dairy products higher in fat such as whole or 2% milk, cheese, ice cream  Alcohol - Moderate amounts of red wine is ok  - No more than 5 oz daily for women (all ages) and men older than age 65  - No more than 10 oz of wine daily for men younger than 65  Other - Limit sweets and other desserts  - Use herbs and spices instead of salt to flavor foods  - Herbs and spices common to   the traditional Mediterranean Diet include: basil, bay leaves, chives, cloves, cumin, fennel, garlic, lavender, marjoram, mint, oregano, parsley, pepper, rosemary, sage, savory, sumac, tarragon, thyme   It's not just a diet, it's a lifestyle:    The Mediterranean diet includes lifestyle factors typical of those in the region    Foods, drinks and meals are best eaten with others and savored   Daily physical activity is important for overall good health   This could be strenuous exercise like running and aerobics   This could also be more leisurely activities such as walking, housework, yard-work, or taking the stairs   Moderation is the key; a balanced and healthy diet accommodates most foods and drinks   Consider portion sizes and frequency of consumption of certain foods   Meal Ideas & Options:    Breakfast:  o Whole wheat toast or whole wheat English  muffins with peanut butter & hard boiled egg o Steel cut oats topped with apples & cinnamon and skim milk  o Fresh fruit: banana, strawberries, melon, berries, peaches  o Smoothies: strawberries, bananas, greek yogurt, peanut butter o Low fat greek yogurt with blueberries and granola  o Egg white omelet with spinach and mushrooms o Breakfast couscous: whole wheat couscous, apricots, skim milk, cranberries    Sandwiches:  o Hummus and grilled vegetables (peppers, zucchini, squash) on whole wheat bread   o Grilled chicken on whole wheat pita with lettuce, tomatoes, cucumbers or tzatziki  o Tuna salad on whole wheat bread: tuna salad made with greek yogurt, olives, red peppers, capers, green onions o Garlic rosemary lamb pita: lamb sauted with garlic, rosemary, salt & pepper; add lettuce, cucumber, greek yogurt to pita - flavor with lemon juice and black pepper    Seafood:  o Mediterranean grilled salmon, seasoned with garlic, basil, parsley, lemon juice and black pepper o Shrimp, lemon, and spinach whole-grain pasta salad made with low fat greek yogurt  o Seared scallops with lemon orzo  o Seared tuna steaks seasoned salt, pepper, coriander topped with tomato mixture of olives, tomatoes, olive oil, minced garlic, parsley, green onions and cappers    Meats:  o Herbed greek chicken salad with kalamata olives, cucumber, feta  o Red bell peppers stuffed with spinach, bulgur, lean ground beef (or lentils) & topped with feta   o Kebabs: skewers of chicken, tomatoes, onions, zucchini, squash  o Turkey burgers: made with red onions, mint, dill, lemon juice, feta cheese topped with roasted red peppers   Vegetarian o Cucumber salad: cucumbers, artichoke hearts, celery, red onion, feta cheese, tossed in olive oil & lemon juice  o Hummus and whole grain pita points with a greek salad (lettuce, tomato, feta, olives, cucumbers, red onion) o Lentil soup with celery, carrots made with vegetable broth,  garlic, salt and pepper  o Tabouli salad: parsley, bulgur, mint, scallions, cucumbers, tomato, radishes, lemon juice, olive oil, salt and pepper. o   

## 2014-02-15 LAB — BASIC METABOLIC PANEL WITH GFR
BUN: 18 mg/dL (ref 6–23)
CO2: 27 meq/L (ref 19–32)
Calcium: 9.4 mg/dL (ref 8.4–10.5)
Chloride: 104 mEq/L (ref 96–112)
Creat: 1.61 mg/dL — ABNORMAL HIGH (ref 0.50–1.10)
GFR, EST AFRICAN AMERICAN: 41 mL/min — AB
GFR, Est Non African American: 35 mL/min — ABNORMAL LOW
GLUCOSE: 119 mg/dL — AB (ref 70–99)
Potassium: 3.9 mEq/L (ref 3.5–5.3)
SODIUM: 142 meq/L (ref 135–145)

## 2014-02-15 LAB — LIPID PANEL
Cholesterol: 336 mg/dL — ABNORMAL HIGH (ref 0–200)
HDL: 49 mg/dL (ref >39)
TRIGLYCERIDES: 517 mg/dL — AB (ref <150)
Total CHOL/HDL Ratio: 6.9 Ratio

## 2014-02-15 LAB — ALT: ALT: 16 U/L (ref 0–35)

## 2014-02-15 LAB — THYROID PANEL WITH TSH
Free Thyroxine Index: 1.6 (ref 1.4–3.8)
T3 UPTAKE: 25 % (ref 22.0–35.0)
T4, Total: 6.3 ug/dL (ref 4.5–12.0)
TSH: 1.465 u[IU]/mL (ref 0.350–4.500)

## 2014-02-18 NOTE — Progress Notes (Signed)
Quick Note:  Please advise pt regarding following labs...  Lab results show that kidney function is declining. You need to have an ultrasound of your kidneys. Let the staff know if you are agreeable to having this study scheduled.  Cholesterol and triglyceride numbers are high; this needs to be addressed also. The values have gone up since 4 months ago. Please schedule a visit within the next 4-6 weeks to discuss treatment plan.  Thyroid results are normal. Continue taking current medications.  Copy to pt.  ______

## 2014-02-23 ENCOUNTER — Telehealth: Payer: Self-pay | Admitting: Family Medicine

## 2014-02-23 DIAGNOSIS — N289 Disorder of kidney and ureter, unspecified: Secondary | ICD-10-CM

## 2014-02-23 NOTE — Addendum Note (Signed)
Addended by: Ellsworth Lennox B on: 02/23/2014 05:13 PM   Modules accepted: Orders

## 2014-02-23 NOTE — Telephone Encounter (Signed)
Patient agreed to having ultrasound done if you would like to schedule it/

## 2014-02-23 NOTE — Telephone Encounter (Signed)
I have ordered renal ultrasound to be scheduled at [facility]

## 2014-03-01 ENCOUNTER — Other Ambulatory Visit: Payer: Self-pay

## 2014-03-06 ENCOUNTER — Ambulatory Visit
Admission: RE | Admit: 2014-03-06 | Discharge: 2014-03-06 | Disposition: A | Discharge status: Home / Self Care | Payer: BC Managed Care – PPO | Source: Ambulatory Visit | Attending: Family Medicine | Admitting: Family Medicine

## 2014-03-06 DIAGNOSIS — N289 Disorder of kidney and ureter, unspecified: Secondary | ICD-10-CM

## 2014-03-06 IMAGING — US US RENAL
1 series · 14 of 25 positions shown · non-contrast
Comparison: No prior for comparison

CLINICAL DATA: 58-year-old female with renal insufficiency. History
of hypertension and diabetes.

EXAM:
RENAL/URINARY TRACT ULTRASOUND COMPLETE

[Series 1: us renal · 0.23mm/px · 14 of 35 slices shown]
[im 1/35]
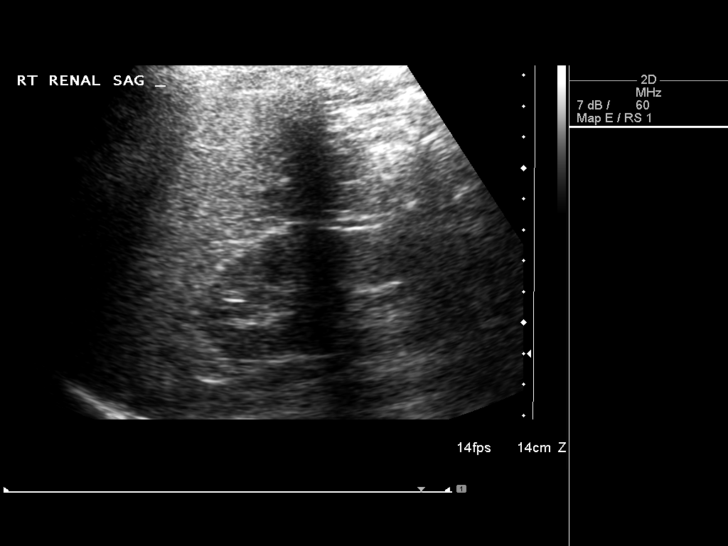
[im 3/35]
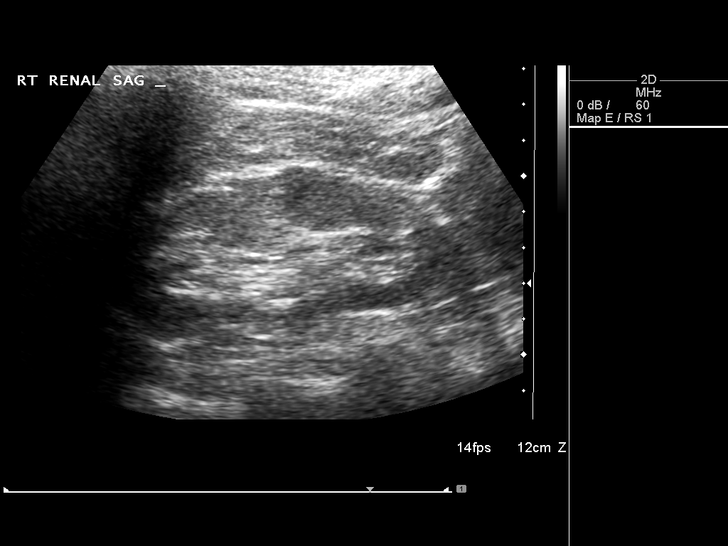
[im 6/35]
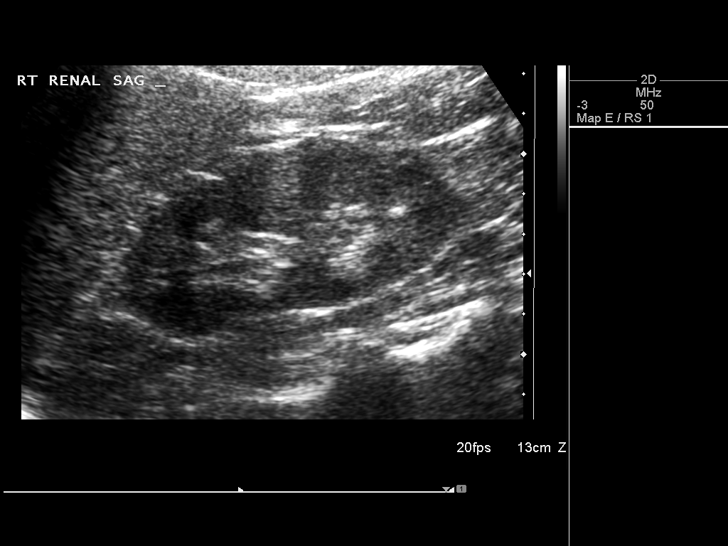
[im 9/35]
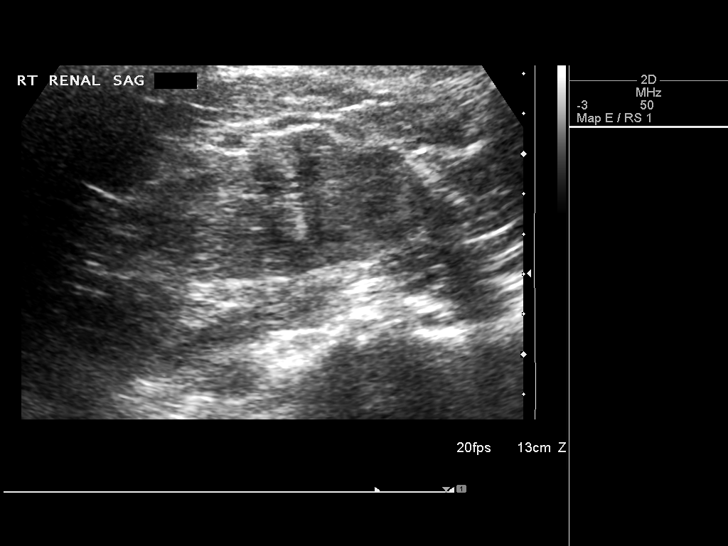
[im 12/35]
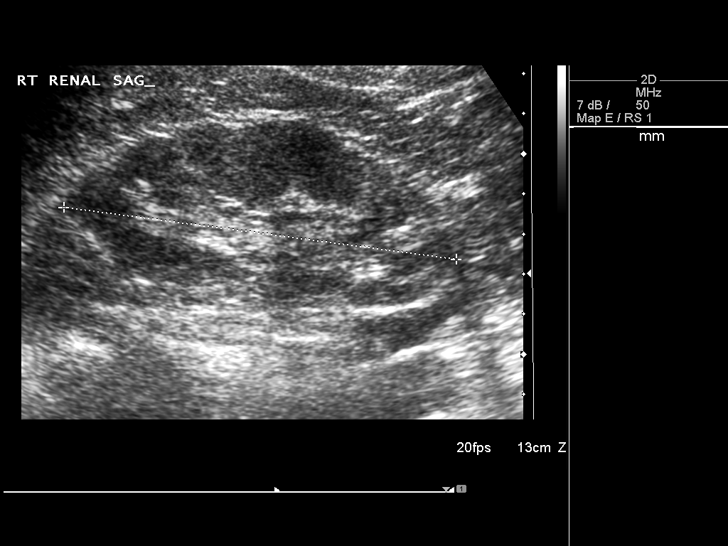
[im 13/35]
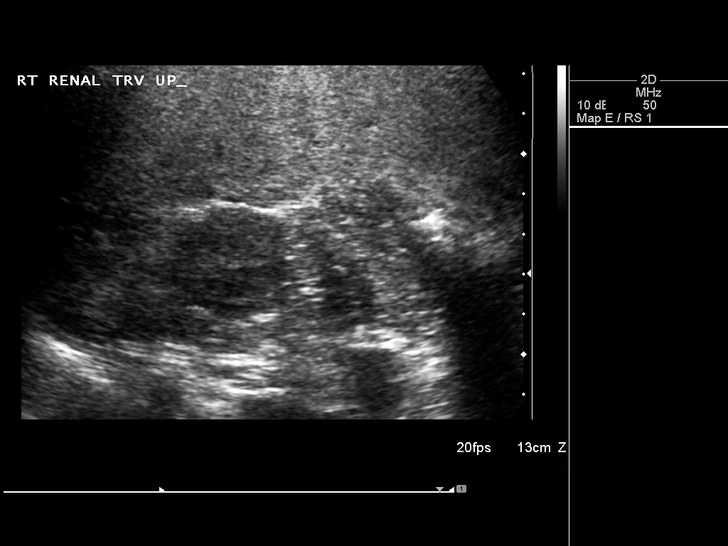
[im 16/35]
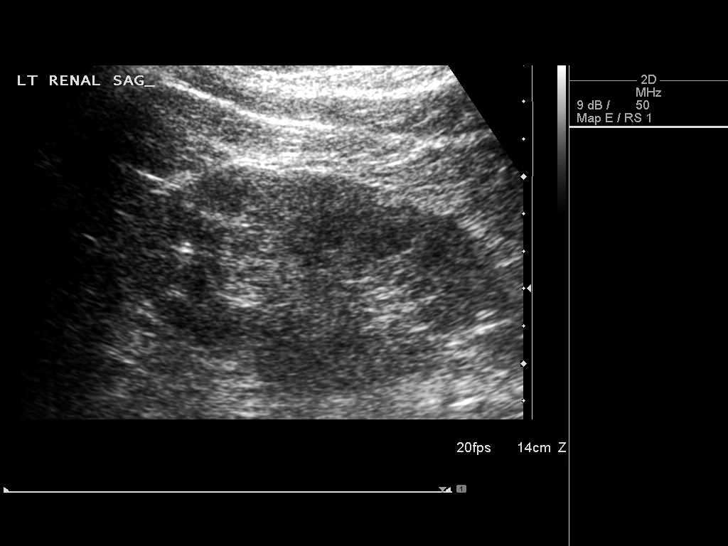
[im 19/35]
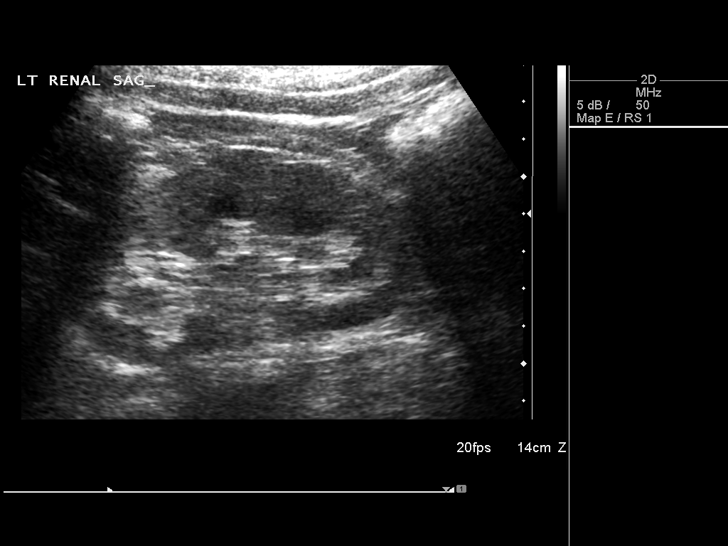
[im 22/35]
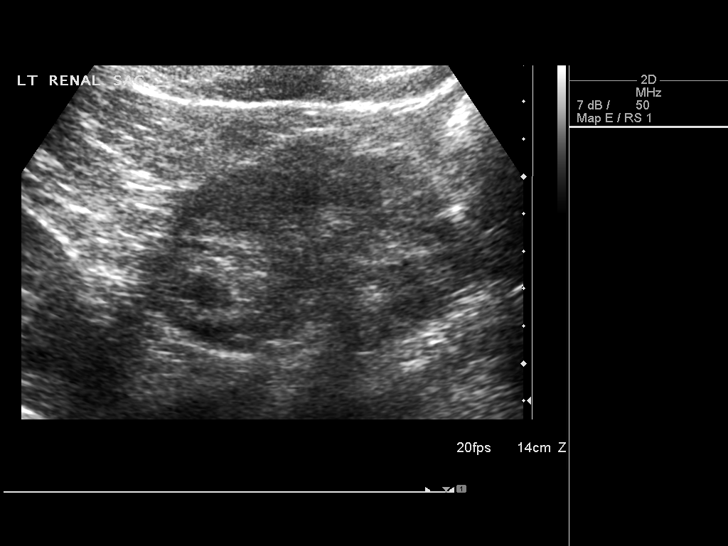
[im 23/35]
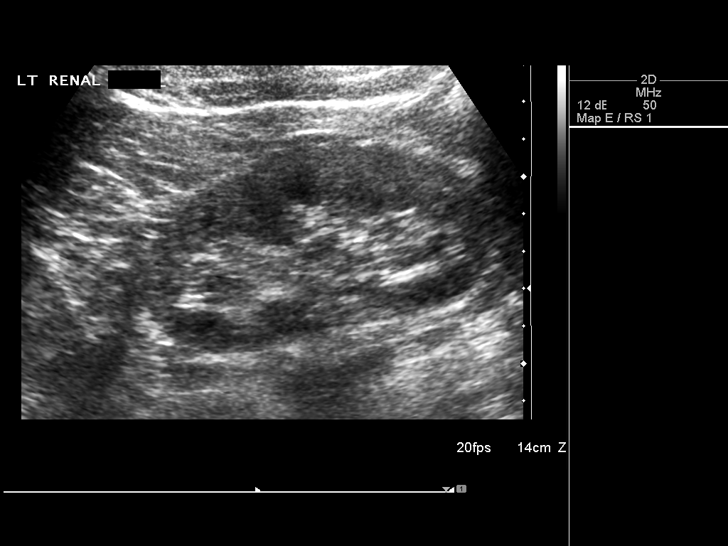
[im 26/35]
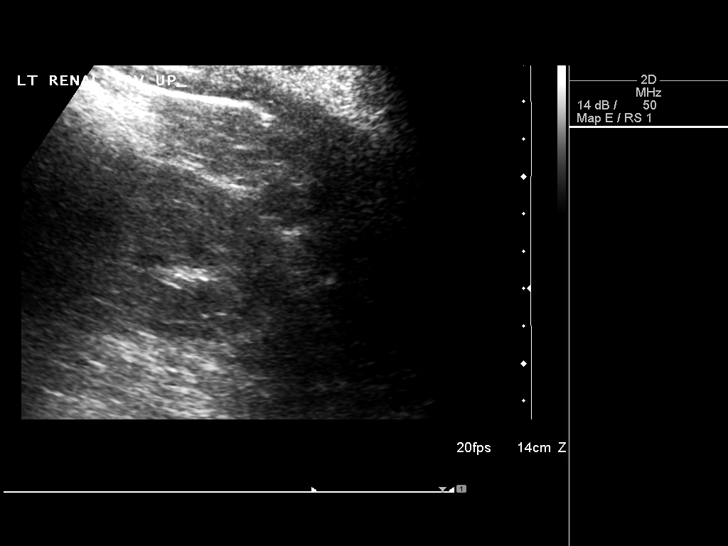
[im 29/35]
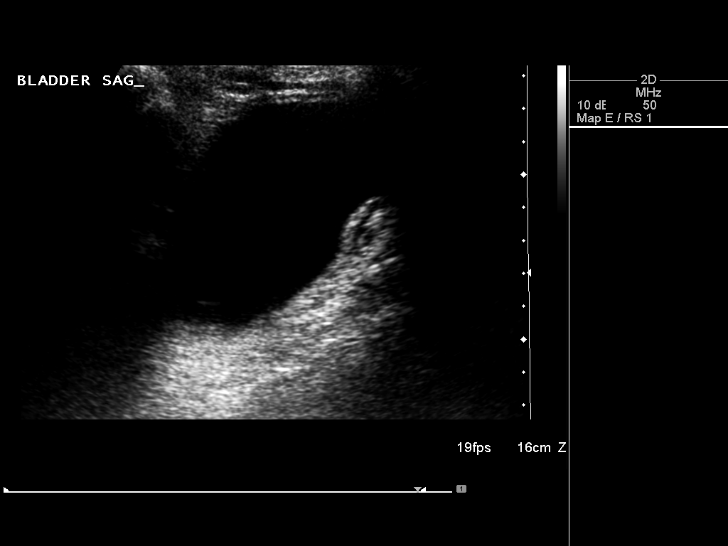
[im 32/35]
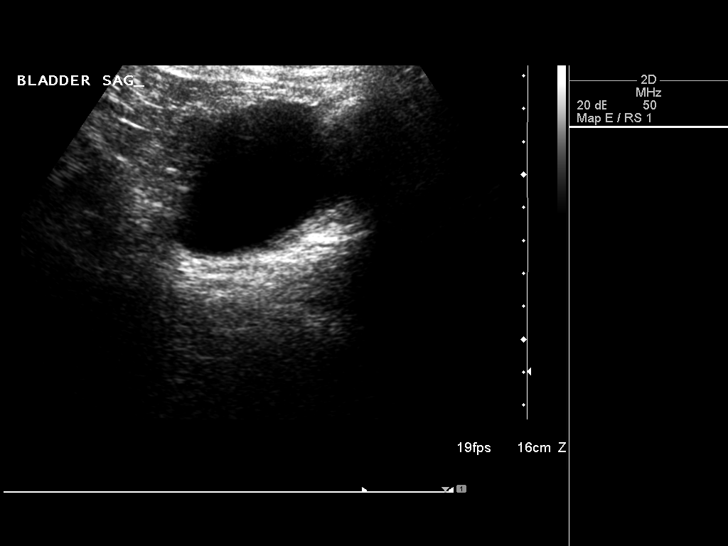
[im 35/35]
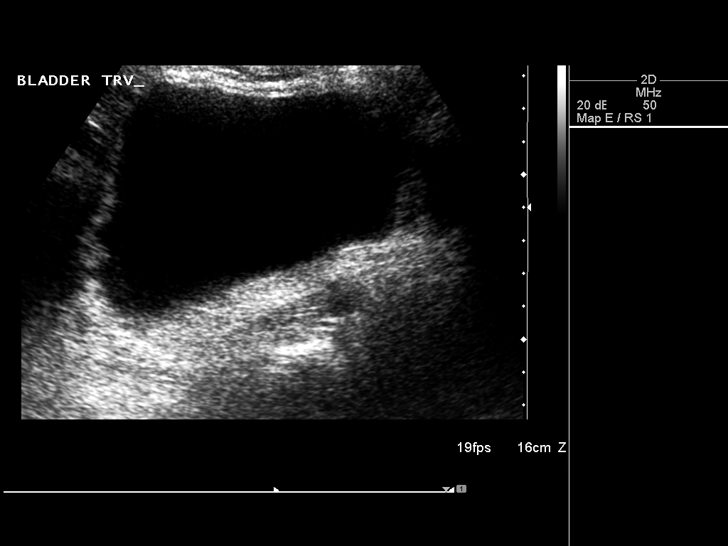

[14 of 25 positions shown; findings below may reference images not displayed]

FINDINGS: Right Kidney:

Length: 9.9 cm. Echogenicity within normal limits. No mass or
hydronephrosis visualized.

Left Kidney:

Length: 9.9 cm. Echogenicity within normal limits. No mass or
hydronephrosis visualized.

Bladder:

Appears normal for degree of bladder distention.
IMPRESSION: Unremarkable sonographic survey of the bilateral kidneys.

## 2014-03-07 NOTE — Progress Notes (Signed)
Quick Note:  Your recent imaging study (of your kidneys) is normal. ______

## 2014-04-05 ENCOUNTER — Encounter: Payer: Self-pay | Admitting: Family Medicine

## 2014-04-05 ENCOUNTER — Ambulatory Visit (INDEPENDENT_AMBULATORY_CARE_PROVIDER_SITE_OTHER): Payer: BC Managed Care – PPO | Admitting: Family Medicine

## 2014-04-05 VITALS — BP 134/96 | HR 98 | Temp 99.1°F | Resp 16 | Ht 64.5 in | Wt 172.8 lb

## 2014-04-05 DIAGNOSIS — Z23 Encounter for immunization: Secondary | ICD-10-CM

## 2014-04-05 DIAGNOSIS — E785 Hyperlipidemia, unspecified: Secondary | ICD-10-CM

## 2014-04-05 DIAGNOSIS — E1169 Type 2 diabetes mellitus with other specified complication: Secondary | ICD-10-CM

## 2014-04-05 DIAGNOSIS — E1141 Type 2 diabetes mellitus with diabetic mononeuropathy: Secondary | ICD-10-CM

## 2014-04-05 MED ORDER — GEMFIBROZIL 600 MG PO TABS: 600.0000 mg | ORAL_TABLET | Freq: Two times a day (BID) | ORAL | Status: DC

## 2014-04-05 NOTE — Progress Notes (Signed)
S: This 58 y.o. [place] female has Type II DM w/ last A1c= 6.3% in August. No hypoglycemic episodes. She is here today to review results of renal ultrasound and lipid results. She feels good but burning discomfort in feet persists; this problem limits her physical activity. She does use a stationary bike for 10-15 minutes several times a week. Midriff weight gain troubles pt because this is the weight that is most difficult to lose. Pt takes all medications including pravastatin but it does not seem to be effective at lowering lipids.   Patient Active Problem List   Diagnosis Date Noted  . Dyslipidemia associated with type 2 diabetes mellitus 05/30/2013  . Detached retina, left 11/25/2011  . Depression with anxiety 11/25/2011  . Neuropathy in diabetes 09/15/2011  . Insomnia, unspecified   . Unspecified hypothyroidism   . DIABETES MELLITUS, TYPE II 08/26/2010  . HYPERTENSION 08/26/2010  . RECTAL PAIN 08/26/2010    Prior to Admission medications   Medication Sig Start Date End Date Taking? Authorizing Provider  amitriptyline (ELAVIL) 100 MG tablet TAKE ONE TABLET BY MOUTH NIGHTLY AT BEDTIME  09/25/13  Yes Barton Fanny, MD  aspirin 81 MG tablet Take 81 mg by mouth daily.   Yes Historical Provider, MD  carbamazepine (TEGRETOL) 200 MG tablet Take 1 tablet (200 mg total) by mouth 3 (three) times daily. 10/04/13  Yes Barton Fanny, MD  HYDROcodone-acetaminophen (NORCO) 5-325 MG per tablet Take 1 tablet in evening prn foot pain. 10/04/13  Yes Barton Fanny, MD  levothyroxine (SYNTHROID, LEVOTHROID) 50 MCG tablet Take one tablet by mouth one time daily 02/14/14  Yes Barton Fanny, MD  lisinopril-hydrochlorothiazide (PRINZIDE,ZESTORETIC) 20-12.5 MG per tablet Take 1 tablet by mouth daily. 02/14/14  Yes Barton Fanny, MD  metFORMIN (GLUCOPHAGE) 500 MG tablet TAKE ONE TABLETS BY MOUTH EVERY MORNING WITH A MEAL AND TWO IN THE EVENING WITH A MEAL   Yes Barton Fanny, MD   Blood Glucose Monitoring Suppl (BLOOD GLUCOSE MONITOR KIT) KIT Use to test blood sugar daily. 01/12/13   Barton Fanny, MD  FLUoxetine (PROZAC) 20 MG capsule Take 1 capsule (20 mg total) by mouth 2 (two) times daily. 05/30/13   Barton Fanny, MD  glucose blood test strip Use to test blood sugar daily. 01/12/13   Barton Fanny, MD  [employer] BLOOD GLUCOSE TEST test strip USE TO TEST BLOOD SUGAR ONCE DAILY AS DIRECTED BY PHYSICIAN 01/08/13   Barton Fanny, MD  ondansetron (ZOFRAN) 4 MG tablet Take 4 mg by mouth 6 (six) times daily. As needed for nausea    Historical Provider, MD  pantoprazole (PROTONIX) 40 MG tablet Take 40 mg by mouth daily.    Historical Provider, MD  pravastatin (PRAVACHOL) 40 MG Take 1 tablet (40 mg total) by mouth daily. Take at bedtime. 02/14/2014  Yes Barton Fanny, MD  rOPINIRole (REQUIP) 0.25 MG tablet Take 0.5 mg by mouth. 12/25/13 12/25/14  Historical Provider, MD    [place] and FAM Hx reviewed.   ROS: As per HPI; negative for fatigue, abnormal weight change, diaphoresis, CP or tightness, palpitations, SOB or DOE, edema, leg pain or swelling in calves, HA, dizziness, weakness or syncope.  O: Filed Vitals:   04/05/14 0931  BP: 134/96  Pulse: 98  Temp: 99.1 F (37.3 C)  Resp: 16    GEN: In NAD; WN,WD HENT: [place]/AT; otherwise unremarkable. COR: RRR. LUNGS: Unlabored resp. SKIN: W&D; intact.  MS: Pt wearing short lace-up boots.  NEURO: A&O x 3; CNs intact. Nonfocal.   LAB- 02/14/2014 Lipids: TChol= 336   TGs= 571   HDL= 49   LDL- not calc   A/P: Dyslipidemia associated with type 2 diabetes mellitus - Discontinue pravastatin 40 mg hs;  trial Lopid 600 mg 1 tablet twice daily before meals.  Plan: Lipid panel, ALT (future order) after med change.  Diabetic mononeuropathy associated with type 2 diabetes mellitus- Pt can try OTC product that may help reduce neuropathic discomfort.  Need for prophylactic vaccination and inoculation against  influenza - Plan: Flu Vaccine QUAD 36+ mos IM

## 2014-06-07 ENCOUNTER — Ambulatory Visit (INDEPENDENT_AMBULATORY_CARE_PROVIDER_SITE_OTHER): Payer: Medicare Other | Admitting: Family Medicine

## 2014-06-07 ENCOUNTER — Encounter: Payer: Self-pay | Admitting: Family Medicine

## 2014-06-07 VITALS — BP 130/90 | HR 89 | Temp 98.4°F | Resp 16 | Ht 65.0 in | Wt 174.6 lb

## 2014-06-07 DIAGNOSIS — K5900 Constipation, unspecified: Secondary | ICD-10-CM

## 2014-06-07 DIAGNOSIS — E1149 Type 2 diabetes mellitus with other diabetic neurological complication: Secondary | ICD-10-CM

## 2014-06-07 DIAGNOSIS — E785 Hyperlipidemia, unspecified: Secondary | ICD-10-CM

## 2014-06-07 DIAGNOSIS — E1169 Type 2 diabetes mellitus with other specified complication: Secondary | ICD-10-CM

## 2014-06-07 LAB — POCT GLYCOSYLATED HEMOGLOBIN (HGB A1C): HEMOGLOBIN A1C: 6.7

## 2014-06-07 MED ORDER — OMEPRAZOLE 20 MG PO CPDR: DELAYED_RELEASE_CAPSULE | ORAL | Status: DC

## 2014-06-07 NOTE — Patient Instructions (Signed)
Continue current medications and I will see you again in 4 months for Diabetes follow-up.  For constipation- get MIRALAX and use it according to package directions.

## 2014-06-08 LAB — BASIC METABOLIC PANEL
BUN: 17 mg/dL (ref 6–23)
CALCIUM: 9.4 mg/dL (ref 8.4–10.5)
CO2: 23 mEq/L (ref 19–32)
Chloride: 104 mEq/L (ref 96–112)
Creat: 1.42 mg/dL — ABNORMAL HIGH (ref 0.50–1.10)
GLUCOSE: 117 mg/dL — AB (ref 70–99)
POTASSIUM: 4.5 meq/L (ref 3.5–5.3)
Sodium: 141 mEq/L (ref 135–145)

## 2014-06-08 LAB — LIPID PANEL
CHOLESTEROL: 186 mg/dL (ref 0–200)
HDL: 42 mg/dL (ref >39)
LDL CALC: 107 mg/dL — AB (ref 0–99)
TRIGLYCERIDES: 183 mg/dL — AB (ref <150)
Total CHOL/HDL Ratio: 4.4 Ratio
VLDL: 37 mg/dL (ref 0–40)

## 2014-06-08 NOTE — Progress Notes (Signed)
Quick Note:  Please advise pt regarding following labs...  Your recent lab results show great improvement of lipid panel (triglycerides have come down a lot). Kidney function is better. Continue all medications.  Contact the clinic if you have any questions or concerns.  Copy to pt. ______

## 2014-06-10 ENCOUNTER — Encounter: Payer: Self-pay | Admitting: Family Medicine

## 2014-06-10 NOTE — Progress Notes (Signed)
S:  This 58 y.o. [place] female has well controlled Type II DM, last A1c= 6.3% (Aug 2015).  Pt compliant w/ medications but nutrition suffers because of occasional emotional eating. Last lipid panel in Aug 2015 showed elevated total cholesterol and triglycerides (LDL could not be measured); these labs were reviewed w/ pt.  Pt has chronic peripheral/diabetic neuropathy being treated by neurologist. Pt is compliant w/ current treatment but has persistent foot pain and numbness. Medications include: carbamazepine, ropinirole and amitriptyline.   Pt c/o mild constipation; she has not tried any OTC products. Denies n/v/d, abd pain, hematochezia or melena. Also needs med for indigestion.  Patient Active Problem List   Diagnosis Date Noted  . Dyslipidemia associated with type 2 diabetes mellitus 05/30/2013  . Detached retina, left 11/25/2011  . Depression with anxiety 11/25/2011  . Neuropathy in diabetes 09/15/2011  . Insomnia, unspecified   . Unspecified hypothyroidism   . DIABETES MELLITUS, TYPE II 08/26/2010  . HYPERTENSION 08/26/2010  . RECTAL PAIN 08/26/2010    Prior to Admission medications   Medication Sig Start Date End Date Taking? Authorizing Provider  amitriptyline (ELAVIL) 100 MG tablet TAKE ONE TABLET BY MOUTH NIGHTLY AT BEDTIME  09/25/13  Yes Barton Fanny, MD  aspirin 81 MG tablet Take 81 mg by mouth daily.   Yes Historical Provider, MD  carbamazepine (TEGRETOL) 200 MG tablet Take 1 tablet (200 mg total) by mouth 3 (three) times daily. 10/04/13  Yes Barton Fanny, MD  gemfibrozil (LOPID) 600 MG tablet Take 1 tablet (600 mg total) by mouth 2 (two) times daily before a meal. 04/05/14  Yes Barton Fanny, MD  HYDROcodone-acetaminophen (NORCO) 5-325 MG per tablet Take 1 tablet in evening prn foot pain. 10/04/13  Yes Barton Fanny, MD  levothyroxine (SYNTHROID, LEVOTHROID) 50 MCG tablet Take one tablet by mouth one time daily 02/14/14  Yes Barton Fanny, MD   lisinopril-hydrochlorothiazide (PRINZIDE,ZESTORETIC) 20-12.5 MG per tablet Take 1 tablet by mouth daily. 02/14/14  Yes Barton Fanny, MD  metFORMIN (GLUCOPHAGE) 500 MG tablet TAKE ONE TABLETS BY MOUTH EVERY MORNING WITH A MEAL AND TWO IN THE EVENING WITH A MEAL   Yes Barton Fanny, MD  rOPINIRole (REQUIP) 0.25 MG tablet Take 0.5 mg by mouth. 12/25/13 12/25/14 Yes Historical Provider, MD  Blood Glucose Monitoring Suppl (BLOOD GLUCOSE MONITOR KIT) KIT Use to test blood sugar daily. Patient not taking: Reported on 06/07/2014 01/12/13   Barton Fanny, MD  FLUoxetine (PROZAC) 20 MG capsule Take 1 capsule (20 mg total) by mouth 2 (two) times daily. Patient not taking: Reported on 06/07/2014 05/30/13   Barton Fanny, MD  glucose blood test strip Use to test blood sugar daily. Patient not taking: Reported on 06/07/2014 01/12/13   Barton Fanny, MD  [employer] BLOOD GLUCOSE TEST test strip USE TO TEST BLOOD SUGAR ONCE DAILY AS DIRECTED BY PHYSICIAN Patient not taking: Reported on 06/07/2014 01/08/13   Barton Fanny, MD  omeprazole (PRILOSEC) 20 MG capsule Take 2 capsules once a day for nausea and reflux.    Barton Fanny, MD    PMHx, Surg Hx and SOC Hx reviewed.  ROS: As per HPI.  O: Filed Vitals:   06/07/14 1037  BP: 130/90  Pulse: 89  Temp: 98.4 F (36.9 C)  Resp: 16    GEN: In NAD: WN,WD. HENT: [place]/AT; EOMI w/ clear conj/sclerae. Otherwise unremarkable. COR: RRR. LUNGS; Unlabored resp. SKIN: W&D; intact.  NEURO: A&O x 3; CNs  intact. See DM FOOT EXAM. Gait is normal. PSYCH: Flat affect. Calm and attentive. Speech pattern and thought content normal.   Results for orders placed or performed in visit on 06/07/14  Lipid panel      POCT glycosylated hemoglobin (Hb A1C)  Result Value Ref Range   Hemoglobin A1C 6.7     A/P: DM (diabetes mellitus) type II controlled, neurological manifestation - Stable and well controlled. Plan: [facility] Diabetes Foot Exam,  Basic metabolic panel, POCT glycosylated hemoglobin (Hb A1C)  Dyslipidemia associated with type 2 diabetes mellitus - Encouraged heart healthy diet and medications.   Plan: Lipid panel  Constipation, unspecified constipation type- Recommended OTC Miralax and increased fiber and physical activity. Pt understands.   Meds ordered this encounter  Medications  . omeprazole (PRILOSEC) 20 MG capsule    Sig: Take 2 capsules once a day for nausea and reflux.    Dispense:  60 capsule    Refill:  3

## 2014-06-18 ENCOUNTER — Other Ambulatory Visit: Payer: Self-pay | Admitting: Family Medicine

## 2014-07-18 ENCOUNTER — Ambulatory Visit: Payer: BC Managed Care – PPO | Admitting: Family Medicine

## 2014-09-03 DIAGNOSIS — R5382 Chronic fatigue, unspecified: Secondary | ICD-10-CM | POA: Diagnosis not present

## 2014-09-03 DIAGNOSIS — Z6828 Body mass index (BMI) 28.0-28.9, adult: Secondary | ICD-10-CM | POA: Diagnosis not present

## 2014-09-03 DIAGNOSIS — G629 Polyneuropathy, unspecified: Secondary | ICD-10-CM | POA: Diagnosis not present

## 2014-09-06 DIAGNOSIS — B351 Tinea unguium: Secondary | ICD-10-CM | POA: Diagnosis not present

## 2014-09-06 DIAGNOSIS — E1142 Type 2 diabetes mellitus with diabetic polyneuropathy: Secondary | ICD-10-CM | POA: Diagnosis not present

## 2014-09-25 LAB — HM DIABETES EYE EXAM

## 2014-09-27 ENCOUNTER — Encounter: Payer: Self-pay | Admitting: *Deleted

## 2014-10-04 ENCOUNTER — Ambulatory Visit: Payer: Medicare Other | Admitting: Family Medicine

## 2014-10-09 ENCOUNTER — Encounter: Payer: Self-pay | Admitting: Family Medicine

## 2014-10-09 ENCOUNTER — Ambulatory Visit (INDEPENDENT_AMBULATORY_CARE_PROVIDER_SITE_OTHER): Payer: Medicare Other | Admitting: Family Medicine

## 2014-10-09 VITALS — BP 119/77 | HR 86 | Temp 97.6°F | Resp 18 | Ht 64.5 in | Wt 167.0 lb

## 2014-10-09 DIAGNOSIS — E034 Atrophy of thyroid (acquired): Secondary | ICD-10-CM

## 2014-10-09 DIAGNOSIS — E1169 Type 2 diabetes mellitus with other specified complication: Secondary | ICD-10-CM | POA: Diagnosis not present

## 2014-10-09 DIAGNOSIS — E785 Hyperlipidemia, unspecified: Secondary | ICD-10-CM | POA: Diagnosis not present

## 2014-10-09 DIAGNOSIS — E1149 Type 2 diabetes mellitus with other diabetic neurological complication: Secondary | ICD-10-CM

## 2014-10-09 DIAGNOSIS — E038 Other specified hypothyroidism: Secondary | ICD-10-CM | POA: Diagnosis not present

## 2014-10-09 LAB — COMPLETE METABOLIC PANEL WITH GFR
ALK PHOS: 79 U/L (ref 39–117)
ALT: 13 U/L (ref 0–35)
AST: 13 U/L (ref 0–37)
Albumin: 4.3 g/dL (ref 3.5–5.2)
BILIRUBIN TOTAL: 0.2 mg/dL (ref 0.2–1.2)
BUN: 21 mg/dL (ref 6–23)
CHLORIDE: 102 meq/L (ref 96–112)
CO2: 23 mEq/L (ref 19–32)
CREATININE: 1.55 mg/dL — AB (ref 0.50–1.10)
Calcium: 8.8 mg/dL (ref 8.4–10.5)
GFR, Est African American: 42 mL/min — ABNORMAL LOW
GFR, Est Non African American: 37 mL/min — ABNORMAL LOW
Glucose, Bld: 110 mg/dL — ABNORMAL HIGH (ref 70–99)
Potassium: 4.6 mEq/L (ref 3.5–5.3)
Sodium: 139 mEq/L (ref 135–145)
Total Protein: 6.8 g/dL (ref 6.0–8.3)

## 2014-10-09 LAB — LIPID PANEL
CHOL/HDL RATIO: 6.3 ratio
Cholesterol: 309 mg/dL — ABNORMAL HIGH (ref 0–200)
HDL: 49 mg/dL (ref >=46)
Triglycerides: 404 mg/dL — ABNORMAL HIGH (ref <150)

## 2014-10-09 LAB — TSH: TSH: 0.866 u[IU]/mL (ref 0.350–4.500)

## 2014-10-09 LAB — POCT GLYCOSYLATED HEMOGLOBIN (HGB A1C): Hemoglobin A1C: 6.5

## 2014-10-09 MED ORDER — METFORMIN HCL 500 MG PO TABS: ORAL_TABLET | ORAL | Status: DC

## 2014-10-09 MED ORDER — CARBAMAZEPINE 200 MG PO TABS: 200.0000 mg | ORAL_TABLET | Freq: Three times a day (TID) | ORAL | Status: DC

## 2014-10-09 MED ORDER — HYDROCODONE-ACETAMINOPHEN 5-325 MG PO TABS: ORAL_TABLET | ORAL | Status: DC

## 2014-10-09 MED ORDER — LEVOTHYROXINE SODIUM 50 MCG PO TABS: ORAL_TABLET | ORAL | Status: DC

## 2014-10-09 MED ORDER — GLUCOSE BLOOD VI STRP: ORAL_STRIP | Status: DC

## 2014-10-09 MED ORDER — OMEPRAZOLE 20 MG PO CPDR: DELAYED_RELEASE_CAPSULE | ORAL | Status: DC

## 2014-10-09 MED ORDER — LISINOPRIL-HYDROCHLOROTHIAZIDE 20-12.5 MG PO TABS: 1.0000 | ORAL_TABLET | Freq: Every day | ORAL | Status: DC

## 2014-10-09 MED ORDER — AMITRIPTYLINE HCL 100 MG PO TABS: 100.0000 mg | ORAL_TABLET | Freq: Every day | ORAL | Status: DC

## 2014-10-09 MED ORDER — FLUOXETINE HCL 20 MG PO CAPS: 20.0000 mg | ORAL_CAPSULE | Freq: Two times a day (BID) | ORAL | Status: DC

## 2014-10-09 NOTE — Progress Notes (Signed)
S:  This 59 y.o. Female has well controlled Type II DM, HTN and lipid disorder associated w/ DM. Last A1c in Dec 2015 = 6.7%. She has peripheral neuropathy, evaluated by podiatry and recently prescribed METANX B complex vitamin for treatment of neuropathy. Pt states RX cost $174.00 and not covered by her insurer. Otherwise, she is doing well, managing DM with better nutrition ("eating a lot of fruits and veggies w/ lean meats") and exercising most days of the week. No FSBS to report; denies hypoglycemic symptoms.  Patient Active Problem List   Diagnosis Date Noted  . Dyslipidemia associated with type 2 diabetes mellitus 05/30/2013  . Detached retina, left 11/25/2011  . Depression with anxiety 11/25/2011  . Neuropathy in diabetes 09/15/2011  . Insomnia, unspecified   . Hypothyroidism   . DIABETES MELLITUS, TYPE II 08/26/2010  . HYPERTENSION 08/26/2010  . RECTAL PAIN 08/26/2010    Prior to Admission medications   Medication Sig Start Date End Date Taking? Authorizing Provider  amitriptyline (ELAVIL) 100 MG tablet Take 1 tablet (100 mg total) by mouth at bedtime.   Yes Barton Fanny, MD  aspirin 81 MG tablet Take 81 mg by mouth daily.   Yes Historical Provider, MD  Blood Glucose Monitoring Suppl (BLOOD GLUCOSE MONITOR KIT) KIT Use to test blood sugar daily. 01/12/13  Yes Barton Fanny, MD  carbamazepine (TEGRETOL) 200 MG tablet Take 1 tablet (200 mg total) by mouth 3 (three) times daily.   Yes Barton Fanny, MD  FLUoxetine (PROZAC) 20 MG capsule Take 1 capsule (20 mg total) by mouth 2 (two) times daily.   Yes Barton Fanny, MD  glucose blood test strip Use to test blood sugar daily.   Yes Barton Fanny, MD  HYDROcodone-acetaminophen (NORCO) 5-325 MG per tablet Take 1 tablet in evening prn foot pain.   Yes Barton Fanny, MD  levothyroxine (SYNTHROID, LEVOTHROID) 50 MCG tablet Take one tablet by mouth one time daily   Yes Barton Fanny, MD   lisinopril-hydrochlorothiazide (PRINZIDE,ZESTORETIC) 20-12.5 MG per tablet Take 1 tablet by mouth daily.   Yes Barton Fanny, MD  metFORMIN (GLUCOPHAGE) 500 MG tablet TAKE ONE TABLET BY MOUTH EVERY MORNING WITH BREAKFAST AND TWO TABS EVERY EVENING WITH DINNER   Yes Barton Fanny, MD  omeprazole (PRILOSEC) 20 MG capsule Take 2 capsules once a day for nausea and reflux.   Yes Barton Fanny, MD  rOPINIRole (REQUIP) 0.25 MG tablet Take 0.5 mg by mouth. 12/25/13 12/25/14 Yes Historical Provider, MD    SURG, SOC and FAM HX reviewed.  ROS: As per HPI; noncontributory.  O: Filed Vitals:   10/09/14 1246  BP: 119/77  Pulse: 86  Temp: 97.6 F (36.4 C)  Resp: 18    GEN: In NAD; WN,WD. HENT: Bier/AT; EOMI w/ clear conj/sclerae. Otherwise unremarkable. COR: RRR. No edema. LUNGS: CTA. Normal resp rate and effort. SKIN: W&D; intact. NEURO: A&O x 3; CNs intact. Nonfocal.  Results for orders placed or performed in visit on 10/09/14  POCT glycosylated hemoglobin (Hb A1C)  Result Value Ref Range   Hemoglobin A1C 6.5     A/P: DM (diabetes mellitus) type II controlled, neurological manifestation - Stable and well controlled. No medication changes. Plan: POCT glycosylated hemoglobin (Hb A1C), COMPLETE METABOLIC PANEL WITH GFR  Dyslipidemia associated with type 2 diabetes mellitus - Plan: Lipid panel  Hypothyroidism due to acquired atrophy of thyroid - Plan: TSH   Meds ordered this encounter  Medications  .  HYDROcodone-acetaminophen (NORCO) 5-325 MG per tablet    Sig: Take 1 tablet in evening prn foot pain.    Dispense:  30 tablet    Refill:  0    May fill on or after Nov 03, 2013.  Marland Kitchen FLUoxetine (PROZAC) 20 MG capsule    Sig: Take 1 capsule (20 mg total) by mouth 2 (two) times daily.    Dispense:  60 capsule    Refill:  5  . carbamazepine (TEGRETOL) 200 MG tablet    Sig: Take 1 tablet (200 mg total) by mouth 3 (three) times daily.    Dispense:  90 tablet    Refill:  5   . lisinopril-hydrochlorothiazide (PRINZIDE,ZESTORETIC) 20-12.5 MG per tablet    Sig: Take 1 tablet by mouth daily.    Dispense:  30 tablet    Refill:  11  . omeprazole (PRILOSEC) 20 MG capsule    Sig: Take 2 capsules once a day for nausea and reflux.    Dispense:  60 capsule    Refill:  3  . metFORMIN (GLUCOPHAGE) 500 MG tablet    Sig: TAKE ONE TABLET BY MOUTH EVERY MORNING WITH BREAKFAST AND TWO TABS EVERY EVENING WITH DINNER    Dispense:  90 tablet    Refill:  5  . amitriptyline (ELAVIL) 100 MG tablet    Sig: Take 1 tablet (100 mg total) by mouth at bedtime.    Dispense:  30 tablet    Refill:  5  . levothyroxine (SYNTHROID, LEVOTHROID) 50 MCG tablet    Sig: Take one tablet by mouth one time daily    Dispense:  30 tablet    Refill:  5  . glucose blood test strip    Sig: Use to test blood sugar daily.    Dispense:  100 each    Refill:  3    FILL WITH BAYER CONTOUR OR ONE TOUCH STRIPS, PT CHOICE.

## 2014-10-10 ENCOUNTER — Other Ambulatory Visit: Payer: Self-pay

## 2014-10-10 MED ORDER — METFORMIN HCL 500 MG PO TABS: ORAL_TABLET | ORAL | Status: DC

## 2014-10-13 ENCOUNTER — Telehealth: Payer: Self-pay | Admitting: Family Medicine

## 2014-10-18 NOTE — Telephone Encounter (Signed)
Pt LM on lab VM about labs. Please review. Thanks

## 2014-10-23 ENCOUNTER — Telehealth: Payer: Self-pay | Admitting: Family Medicine

## 2014-10-23 MED ORDER — GEMFIBROZIL 600 MG PO TABS: 600.0000 mg | ORAL_TABLET | Freq: Two times a day (BID) | ORAL | Status: DC

## 2014-10-23 NOTE — Telephone Encounter (Signed)
I spoke with pt about her labs. She will restart Lopid to lower cholesterol. She c/o vomiting at night and early in the morning taking omeprazole 1 capsule daily; reminded her that the instructions read 2 capsules daily. She will resume appropriate dose. Pt was contact clinic for evaluation if GI upset continues or worsens.

## 2014-10-23 NOTE — Progress Notes (Signed)
Quick Note:  Please advise pt regarding following labs...  I spoke with pt about results.  Mail copy of labs to her. ______

## 2014-10-24 ENCOUNTER — Telehealth: Payer: Self-pay | Admitting: *Deleted

## 2014-10-24 ENCOUNTER — Encounter: Payer: Self-pay | Admitting: Family Medicine

## 2014-10-24 MED ORDER — GEMFIBROZIL 600 MG PO TABS: 600.0000 mg | ORAL_TABLET | Freq: Two times a day (BID) | ORAL | Status: DC

## 2014-10-24 MED ORDER — CARBAMAZEPINE 200 MG PO TABS: 200.0000 mg | ORAL_TABLET | Freq: Three times a day (TID) | ORAL | Status: DC

## 2014-10-24 MED ORDER — AMITRIPTYLINE HCL 100 MG PO TABS: 100.0000 mg | ORAL_TABLET | Freq: Every day | ORAL | Status: DC

## 2014-10-24 MED ORDER — LISINOPRIL-HYDROCHLOROTHIAZIDE 20-12.5 MG PO TABS: 1.0000 | ORAL_TABLET | Freq: Every day | ORAL | Status: DC

## 2014-10-24 MED ORDER — OMEPRAZOLE 20 MG PO CPDR: DELAYED_RELEASE_CAPSULE | ORAL | Status: DC

## 2014-10-24 MED ORDER — FLUOXETINE HCL 20 MG PO CAPS: 20.0000 mg | ORAL_CAPSULE | Freq: Two times a day (BID) | ORAL | Status: DC

## 2014-10-24 MED ORDER — LEVOTHYROXINE SODIUM 50 MCG PO TABS: ORAL_TABLET | ORAL | Status: DC

## 2014-10-24 NOTE — Telephone Encounter (Signed)
Patient wants her medication Gemfibrozil 600 mg and other prescriptions for 90 days supply. [facility] [place] .

## 2014-10-24 NOTE — Telephone Encounter (Signed)
I have changed all medications (that I am responsible for) to 90-day supply with refills.

## 2014-12-03 ENCOUNTER — Encounter: Payer: Self-pay | Admitting: *Deleted

## 2014-12-31 ENCOUNTER — Encounter: Payer: Self-pay | Admitting: Gastroenterology

## 2015-01-22 DIAGNOSIS — Z6828 Body mass index (BMI) 28.0-28.9, adult: Secondary | ICD-10-CM | POA: Diagnosis not present

## 2015-01-22 DIAGNOSIS — R5382 Chronic fatigue, unspecified: Secondary | ICD-10-CM | POA: Diagnosis not present

## 2015-01-22 DIAGNOSIS — G629 Polyneuropathy, unspecified: Secondary | ICD-10-CM | POA: Diagnosis not present

## 2015-03-05 NOTE — Telephone Encounter (Signed)
No note

## 2015-03-11 ENCOUNTER — Encounter: Payer: Medicare Other | Admitting: Family Medicine

## 2015-03-11 DIAGNOSIS — E119 Type 2 diabetes mellitus without complications: Secondary | ICD-10-CM | POA: Diagnosis not present

## 2015-03-11 DIAGNOSIS — E1142 Type 2 diabetes mellitus with diabetic polyneuropathy: Secondary | ICD-10-CM | POA: Diagnosis not present

## 2015-03-13 ENCOUNTER — Ambulatory Visit (INDEPENDENT_AMBULATORY_CARE_PROVIDER_SITE_OTHER): Payer: Medicare Other | Admitting: Family Medicine

## 2015-03-13 ENCOUNTER — Encounter: Payer: Self-pay | Admitting: Family Medicine

## 2015-03-13 VITALS — BP 107/76 | HR 81 | Temp 98.0°F | Resp 16 | Ht 65.0 in | Wt 158.8 lb

## 2015-03-13 DIAGNOSIS — Z0000 Encounter for general adult medical examination without abnormal findings: Secondary | ICD-10-CM

## 2015-03-13 DIAGNOSIS — E0840 Diabetes mellitus due to underlying condition with diabetic neuropathy, unspecified: Secondary | ICD-10-CM

## 2015-03-13 DIAGNOSIS — Z119 Encounter for screening for infectious and parasitic diseases, unspecified: Secondary | ICD-10-CM

## 2015-03-13 DIAGNOSIS — E038 Other specified hypothyroidism: Secondary | ICD-10-CM

## 2015-03-13 DIAGNOSIS — Z23 Encounter for immunization: Secondary | ICD-10-CM

## 2015-03-13 DIAGNOSIS — E785 Hyperlipidemia, unspecified: Secondary | ICD-10-CM

## 2015-03-13 DIAGNOSIS — I10 Essential (primary) hypertension: Secondary | ICD-10-CM

## 2015-03-13 LAB — COMPREHENSIVE METABOLIC PANEL
ALK PHOS: 108 U/L (ref 33–130)
ALT: 10 U/L (ref 6–29)
AST: 14 U/L (ref 10–35)
Albumin: 4.9 g/dL (ref 3.6–5.1)
BUN: 26 mg/dL — ABNORMAL HIGH (ref 7–25)
CO2: 22 mmol/L (ref 20–31)
Calcium: 9.6 mg/dL (ref 8.6–10.4)
Chloride: 104 mmol/L (ref 98–110)
Creat: 1.65 mg/dL — ABNORMAL HIGH (ref 0.50–1.05)
Glucose, Bld: 121 mg/dL — ABNORMAL HIGH (ref 65–99)
Potassium: 4.3 mmol/L (ref 3.5–5.3)
Sodium: 139 mmol/L (ref 135–146)
TOTAL PROTEIN: 7.5 g/dL (ref 6.1–8.1)
Total Bilirubin: 0.3 mg/dL (ref 0.2–1.2)

## 2015-03-13 LAB — TSH: TSH: 1.753 u[IU]/mL (ref 0.350–4.500)

## 2015-03-13 LAB — HEMOGLOBIN A1C
Hgb A1c MFr Bld: 6 % — ABNORMAL HIGH (ref <5.7)
MEAN PLASMA GLUCOSE: 126 mg/dL — AB (ref <117)

## 2015-03-13 LAB — LIPID PANEL
CHOL/HDL RATIO: 4.4 ratio (ref <=5.0)
CHOLESTEROL: 252 mg/dL — AB (ref 125–200)
HDL: 57 mg/dL (ref >=46)
LDL Cholesterol: 153 mg/dL — ABNORMAL HIGH (ref <130)
Triglycerides: 209 mg/dL — ABNORMAL HIGH (ref <150)
VLDL: 42 mg/dL — ABNORMAL HIGH (ref <30)

## 2015-03-13 LAB — HEPATITIS C ANTIBODY: HCV Ab: NEGATIVE

## 2015-03-13 NOTE — Patient Instructions (Signed)
You have done a great job with your weight- keep it up!  I will be in touch regarding your labs asap If you would, please ask your OB-GYN to send [place] a report regarding your mammogram and pap if applicable I am sorry that you feet give you such trouble- I wish there was more that we could do to help  Wt Readings from Last 3 Encounters:  03/13/15 158 lb 12.8 oz (72.031 kg)  10/09/14 167 lb (75.751 kg)  06/07/14 174 lb 9.6 oz (79.198 kg)

## 2015-03-13 NOTE — Progress Notes (Addendum)
[facility] [address], [place] [address] [phone]  Date:  03/13/2015   Name:  Caroline Simmons   DOB:  1955-12-19   MRN:  [id number]  PCP:  Ellsworth Lennox, MD    Chief Complaint: Annual Exam   History of Present Illness:  Caroline Simmons is a 59 y.o. very pleasant female patient who presents with the following:  Last seen about 6 months ago by Dr. Leward Quan Here today for an annual exam History of DM, depression, peripheral neuropathy, hypothyroid, HTN Last cholesterol in April did not look good, but A1c well controlled  BP well controlled  She is seeing her OBG tomorrow for her well woman exam She is on tegretol, elevil, and occasional hydrocodone for her feet She has had the neuropathy for about 6 years.    She had a detached retina in the left eye in 2011- she has not been able to see out of this eye well since but she is able to use it for near vision  She is UTD on her eye exams  She is retired/ on disability due to her neuropathy.  She worked in the [employer] for [employer] for 30 years.  She still exercises by riding a stationary bike and has lost some weight States that her mood is ok, she does not feel that she is particularly depressed but she does not like having to take so many medications   Wt Readings from Last 3 Encounters:  03/13/15 158 lb 12.8 oz (72.031 kg)  10/09/14 167 lb (75.751 kg)  06/07/14 174 lb 9.6 oz (79.198 kg)     She is fasting today for labs  She is on proxac 30 BID  Lab Results  Component Value Date   HGBA1C 6.5 10/09/2014     Patient Active Problem List   Diagnosis Date Noted  . Dyslipidemia associated with type 2 diabetes mellitus 05/30/2013  . Detached retina, left 11/25/2011  . Depression with anxiety 11/25/2011  . Neuropathy in diabetes 09/15/2011  . Insomnia, unspecified   . Hypothyroidism   . DIABETES MELLITUS, TYPE II 08/26/2010  . HYPERTENSION 08/26/2010   . RECTAL PAIN 08/26/2010    Past Medical History  Diagnosis Date  . Essential hypertension, benign   . Type II or unspecified type diabetes mellitus without mention of complication, not stated as uncontrolled   . Insomnia, unspecified   . Menopause     lmp 06/2007  . Hypothyroid   . Depression     Past Surgical History  Procedure Laterality Date  . Cesarean section  1991 and 1981  . Retinal detachment repair w/ scleral buckle le  11/12/2011  . Eye surgery      Social History  Substance Use Topics  . Smoking status: Former Smoker -- 1.00 packs/day for 3 years    Types: Cigarettes  . Smokeless tobacco: None     Comment: quit 08/2003  . Alcohol Use: Yes     Comment: rare-beer or wine    Family History  Problem Relation Age of Onset  . Diabetes    . Hypertension    . Diabetes Mother     type 2  not sure onset    Allergies  Allergen Reactions  . Codeine Nausea Only    Pt states she has taken this with no problems.    Medication list has been reviewed and updated.  Current Outpatient Prescriptions on File Prior to Visit  Medication Sig Dispense Refill  .  amitriptyline (ELAVIL) 100 MG tablet Take 1 tablet (100 mg total) by mouth at bedtime. 90 tablet 3  . aspirin 81 MG tablet Take 81 mg by mouth daily.    . Blood Glucose Monitoring Suppl (BLOOD GLUCOSE MONITOR KIT) KIT Use to test blood sugar daily. 1 each 0  . carbamazepine (TEGRETOL) 200 MG tablet Take 1 tablet (200 mg total) by mouth 3 (three) times daily. 270 tablet 3  . FLUoxetine (PROZAC) 20 MG capsule Take 1 capsule (20 mg total) by mouth 2 (two) times daily. 180 capsule 3  . gemfibrozil (LOPID) 600 MG tablet Take 1 tablet (600 mg total) by mouth 2 (two) times daily before a meal. 180 tablet 3  . glucose blood test strip Use to test blood sugar daily. 100 each 3  . HYDROcodone-acetaminophen (NORCO) 5-325 MG per tablet Take 1 tablet in evening prn foot pain. 30 tablet 0  . levothyroxine (SYNTHROID, LEVOTHROID)  50 MCG tablet Take one tablet by mouth one time daily 90 tablet 1  . lisinopril-hydrochlorothiazide (PRINZIDE,ZESTORETIC) 20-12.5 MG per tablet Take 1 tablet by mouth daily. 90 tablet 3  . metFORMIN (GLUCOPHAGE) 500 MG tablet TAKE ONE TABLET BY MOUTH EVERY MORNING WITH BREAKFAST AND TWO TABS EVERY EVENING WITH DINNER 270 tablet 1  . omeprazole (PRILOSEC) 20 MG capsule Take 2 capsules once a day for nausea and reflux. 180 capsule 1  . rOPINIRole (REQUIP) 0.25 MG tablet Take 0.5 mg by mouth.     No current facility-administered medications on file prior to visit.    Review of Systems:  As per HPI- otherwise negative.   Physical Examination: Filed Vitals:   03/13/15 0931  BP: 107/76  Pulse: 81  Temp: 98 F (36.7 C)  Resp: 16   Filed Vitals:   03/13/15 0931  Height: '5\' 5"'  (1.651 m)  Weight: 158 lb 12.8 oz (72.031 kg)   Body mass index is 26.43 kg/(m^2). Ideal Body Weight: Weight in (lb) to have BMI = 25: 149.9 ,  GEN: WDWN, NAD, Non-toxic, A & O x 3, mild overweight, looks well HEENT: Atraumatic, Normocephalic. Neck supple. No masses, No LAD.  Bilateral TM wnl, oropharynx normal.  PEERL,EOMI.   Ears and Nose: No external deformity. CV: RRR, No M/G/R. No JVD. No thrill. No extra heart sounds. PULM: CTA B, no wheezes, crackles, rhonchi. No retractions. No resp. distress. No accessory muscle use. ABD: S, NT, ND. No rebound. No HSM. EXTR: No c/c/e NEURO Normal gait.  PSYCH: Normally interactive. Conversant. Not depressed or anxious appearing.  Calm demeanor.  Foot exam- feet look good, warm and well perfused but she is not able to perceive any filament touch   Assessment and Plan: Physical exam  Need for influenza vaccination - Plan: Flu Vaccine QUAD 36+ mos IM  Diabetes mellitus due to underlying condition with diabetic neuropathy - Plan: Comprehensive metabolic panel, Hemoglobin A1c  Screening examination for infectious disease - Plan: Hepatitis C antibody  Dyslipidemia  - Plan: Lipid panel  Other specified hypothyroidism - Plan: TSH   She would like to consider decreasing/ stopping her chl/ DM medications if possible.  I will check on this with her labs  Flu shot, hep C screening, TSH today   Signed Lamar Blinks, MD  9/26- labs as below.  Her GFR is now 43.  Her lipids are better than at last check in April.  Need to stop her metformin given her renal function, and will change her to just lisinopril.  Continue lopid  Called her back on 9/27 and reached her. Will D/C metformin- as A1c is ok for now will leave on no meds.  Change to plain lisinopril.  Be careful with diet. Please see me in 8- 10 weeks for a recheck BP Readings from Last 3 Encounters:  03/13/15 107/76  10/09/14 119/77  06/07/14 130/90    Results for orders placed or performed in visit on 03/13/15  Comprehensive metabolic panel  Result Value Ref Range   Sodium 139 135 - 146 mmol/L   Potassium 4.3 3.5 - 5.3 mmol/L   Chloride 104 98 - 110 mmol/L   CO2 22 20 - 31 mmol/L   Glucose, Bld 121 (H) 65 - 99 mg/dL   BUN 26 (H) 7 - 25 mg/dL   Creat 1.65 (H) 0.50 - 1.05 mg/dL   Total Bilirubin 0.3 0.2 - 1.2 mg/dL   Alkaline Phosphatase 108 33 - 130 U/L   AST 14 10 - 35 U/L   ALT 10 6 - 29 U/L   Total Protein 7.5 6.1 - 8.1 g/dL   Albumin 4.9 3.6 - 5.1 g/dL   Calcium 9.6 8.6 - 10.4 mg/dL  Lipid panel  Result Value Ref Range   Cholesterol 252 (H) 125 - 200 mg/dL   Triglycerides 209 (H) <150 mg/dL   HDL 57 >=46 mg/dL   Total CHOL/HDL Ratio 4.4 <=5.0 Ratio   VLDL 42 (H) <30 mg/dL   LDL Cholesterol 153 (H) <130 mg/dL  Hemoglobin A1c  Result Value Ref Range   Hgb A1c MFr Bld 6.0 (H) <5.7 %   Mean Plasma Glucose 126 (H) <117 mg/dL  Hepatitis C antibody  Result Value Ref Range   HCV Ab NEGATIVE NEGATIVE  TSH  Result Value Ref Range   TSH 1.753 0.350 - 4.500 uIU/mL

## 2015-03-14 DIAGNOSIS — Z1389 Encounter for screening for other disorder: Secondary | ICD-10-CM | POA: Diagnosis not present

## 2015-03-14 DIAGNOSIS — Z130 Encounter for screening for diseases of the blood and blood-forming organs and certain disorders involving the immune mechanism: Secondary | ICD-10-CM | POA: Diagnosis not present

## 2015-03-14 DIAGNOSIS — Z1231 Encounter for screening mammogram for malignant neoplasm of breast: Secondary | ICD-10-CM | POA: Diagnosis not present

## 2015-03-14 DIAGNOSIS — Z01419 Encounter for gynecological examination (general) (routine) without abnormal findings: Secondary | ICD-10-CM | POA: Diagnosis not present

## 2015-03-14 DIAGNOSIS — Z124 Encounter for screening for malignant neoplasm of cervix: Secondary | ICD-10-CM | POA: Diagnosis not present

## 2015-03-19 ENCOUNTER — Encounter: Payer: Self-pay | Admitting: Family Medicine

## 2015-03-19 MED ORDER — LISINOPRIL 20 MG PO TABS: 20.0000 mg | ORAL_TABLET | Freq: Every day | ORAL | Status: DC

## 2015-03-19 NOTE — Addendum Note (Signed)
Addended by: Lamar Blinks C on: 03/19/2015 02:29 PM   Modules accepted: Orders, Medications

## 2015-03-21 ENCOUNTER — Other Ambulatory Visit: Payer: Self-pay

## 2015-03-21 DIAGNOSIS — K219 Gastro-esophageal reflux disease without esophagitis: Secondary | ICD-10-CM

## 2015-03-21 MED ORDER — LEVOTHYROXINE SODIUM 50 MCG PO TABS: ORAL_TABLET | ORAL | Status: DC

## 2015-03-21 MED ORDER — OMEPRAZOLE 20 MG PO CPDR: DELAYED_RELEASE_CAPSULE | ORAL | Status: DC

## 2015-03-21 NOTE — Telephone Encounter (Signed)
Dr Lorelei Pont, you just saw pt for check up, but don't see GERD discussed. OK to RF?

## 2015-03-25 ENCOUNTER — Other Ambulatory Visit: Payer: Self-pay

## 2015-03-25 MED ORDER — FLUOXETINE HCL 20 MG PO CAPS: 20.0000 mg | ORAL_CAPSULE | Freq: Two times a day (BID) | ORAL | Status: DC

## 2015-05-01 ENCOUNTER — Ambulatory Visit (INDEPENDENT_AMBULATORY_CARE_PROVIDER_SITE_OTHER): Payer: Medicare Other | Admitting: Family Medicine

## 2015-05-01 ENCOUNTER — Encounter: Payer: Self-pay | Admitting: Family Medicine

## 2015-05-01 VITALS — BP 130/98 | HR 81 | Temp 98.2°F | Resp 16 | Ht 64.75 in | Wt 161.6 lb

## 2015-05-01 DIAGNOSIS — I10 Essential (primary) hypertension: Secondary | ICD-10-CM

## 2015-05-01 DIAGNOSIS — R748 Abnormal levels of other serum enzymes: Secondary | ICD-10-CM

## 2015-05-01 DIAGNOSIS — Z23 Encounter for immunization: Secondary | ICD-10-CM

## 2015-05-01 DIAGNOSIS — E119 Type 2 diabetes mellitus without complications: Secondary | ICD-10-CM | POA: Diagnosis not present

## 2015-05-01 DIAGNOSIS — R7989 Other specified abnormal findings of blood chemistry: Secondary | ICD-10-CM

## 2015-05-01 LAB — HEMOGLOBIN A1C: HEMOGLOBIN A1C: 6.6 % — AB (ref 4.0–6.0)

## 2015-05-01 LAB — POCT GLYCOSYLATED HEMOGLOBIN (HGB A1C): HEMOGLOBIN A1C: 6.6
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
## 2015-05-01 NOTE — Patient Instructions (Signed)
Please keep an eye on your blood pressure at home- we want it to be running less than 140/90.  If it is higher than this on a consistent basis please let me know and we may need to increase your lisinopril dose  Your diabetes control looks ok without the medication so far  I will be in touch with the rest of your medications  Please come and see me in about 3 months

## 2015-05-02 ENCOUNTER — Encounter: Payer: Self-pay | Admitting: Family Medicine

## 2015-05-02 LAB — BASIC METABOLIC PANEL
BUN: 21 mg/dL (ref 7–25)
CO2: 23 mmol/L (ref 20–31)
CREATININE: 1.32 mg/dL — AB (ref 0.50–1.05)
Calcium: 9.5 mg/dL (ref 8.6–10.4)
Chloride: 105 mmol/L (ref 98–110)
GLUCOSE: 120 mg/dL — AB (ref 65–99)
POTASSIUM: 4.3 mmol/L (ref 3.5–5.3)
Sodium: 141 mmol/L (ref 135–146)

## 2015-05-23 ENCOUNTER — Encounter: Payer: Self-pay | Admitting: Family Medicine

## 2015-06-18 DIAGNOSIS — E663 Overweight: Secondary | ICD-10-CM | POA: Diagnosis not present

## 2015-06-18 DIAGNOSIS — R5383 Other fatigue: Secondary | ICD-10-CM | POA: Diagnosis not present

## 2015-06-18 DIAGNOSIS — Z6827 Body mass index (BMI) 27.0-27.9, adult: Secondary | ICD-10-CM | POA: Diagnosis not present

## 2015-06-18 DIAGNOSIS — R5382 Chronic fatigue, unspecified: Secondary | ICD-10-CM | POA: Diagnosis not present

## 2015-06-18 DIAGNOSIS — G629 Polyneuropathy, unspecified: Secondary | ICD-10-CM | POA: Diagnosis not present

## 2015-07-18 ENCOUNTER — Telehealth: Payer: Self-pay | Admitting: Family Medicine

## 2015-07-18 NOTE — Telephone Encounter (Signed)
lmom to call and reschedule her appt for an 6 month OV that she had in March with another provider this was a [place] pt

## 2015-09-11 ENCOUNTER — Ambulatory Visit: Payer: Self-pay | Admitting: Family Medicine

## 2015-09-18 ENCOUNTER — Telehealth: Payer: Self-pay | Admitting: Family Medicine

## 2015-09-18 NOTE — Telephone Encounter (Signed)
VM from pt 3/29 3:01pm to schedule appt with Dr. Lorelei Pont, called and left msg for pt to call back to schedule

## 2015-09-19 ENCOUNTER — Ambulatory Visit (INDEPENDENT_AMBULATORY_CARE_PROVIDER_SITE_OTHER): Payer: Medicare Other | Admitting: Family Medicine

## 2015-09-19 ENCOUNTER — Encounter: Payer: Self-pay | Admitting: Family Medicine

## 2015-09-19 VITALS — BP 118/78 | HR 84 | Temp 98.1°F | Resp 16 | Ht 64.75 in | Wt 162.6 lb

## 2015-09-19 DIAGNOSIS — E119 Type 2 diabetes mellitus without complications: Secondary | ICD-10-CM | POA: Diagnosis not present

## 2015-09-19 DIAGNOSIS — Z5181 Encounter for therapeutic drug level monitoring: Secondary | ICD-10-CM | POA: Diagnosis not present

## 2015-09-19 DIAGNOSIS — E785 Hyperlipidemia, unspecified: Secondary | ICD-10-CM

## 2015-09-19 DIAGNOSIS — I10 Essential (primary) hypertension: Secondary | ICD-10-CM

## 2015-09-19 DIAGNOSIS — E039 Hypothyroidism, unspecified: Secondary | ICD-10-CM

## 2015-09-19 NOTE — Patient Instructions (Signed)
It was good to see you today It looks like you are doing well! Decease your lisinopril to 10mg  a day as your BP is on the low side I will be in touch with your labs, and then we can plan to recheck in 6 months or so

## 2015-09-19 NOTE — Progress Notes (Signed)
[place] at [facility] [address], [place], [place] [address] [phone]  Date:  09/19/2015   Name:  Caroline Simmons   DOB:  12-02-1955   MRN:  [id number]  PCP:  Ellsworth Lennox, MD    Chief Complaint: Follow-up   History of Present Illness:  Caroline Simmons is a 60 y.o. very pleasant female patient who presents with the following:  Here today to follow-up on DM.  She had stopped her medications for DM several months ago and was dong well at last A1c check as below.   She is just on lisinopril 20 for her BP- we stopped her HCTZ She is seeing a podiatrist for chronic foot pain next week  She is fasting for labs today  She does not check her glucose at home due to cost of strips.    BP Readings from Last 3 Encounters:  09/19/15 118/78  05/01/15 130/98  03/13/15 107/76     Lab Results  Component Value Date   HGBA1C 6.6 05/01/2015     Patient Active Problem List   Diagnosis Date Noted  . Dyslipidemia associated with type 2 diabetes mellitus ([place]) 05/30/2013  . Detached retina, left 11/25/2011  . Depression with anxiety 11/25/2011  . Neuropathy in diabetes ([place]) 09/15/2011  . Insomnia, unspecified   . Hypothyroidism   . DIABETES MELLITUS, TYPE II 08/26/2010  . HYPERTENSION 08/26/2010  . RECTAL PAIN 08/26/2010    Past Medical History  Diagnosis Date  . Essential hypertension, benign   . Type II or unspecified type diabetes mellitus without mention of complication, not stated as uncontrolled   . Insomnia, unspecified   . Menopause     lmp 06/2007  . Hypothyroid   . Depression     Past Surgical History  Procedure Laterality Date  . Cesarean section  1991 and 1981  . Retinal detachment repair w/ scleral buckle le  11/12/2011  . Eye surgery      Social History  Substance Use Topics  . Smoking status: Former Smoker -- 1.00 packs/day for 3 years    Types: Cigarettes  . Smokeless tobacco: None   Comment: quit 08/2003  . Alcohol Use: Yes     Comment: rare-beer or wine    Family History  Problem Relation Age of Onset  . Diabetes    . Hypertension    . Diabetes Mother     type 2  not sure onset    Allergies  Allergen Reactions  . Codeine Nausea Only    Pt states she has taken this with no problems.    Medication list has been reviewed and updated.  Current Outpatient Prescriptions on File Prior to Visit  Medication Sig Dispense Refill  . amitriptyline (ELAVIL) 100 MG tablet Take 1 tablet (100 mg total) by mouth at bedtime. 90 tablet 3  . aspirin 81 MG tablet Take 81 mg by mouth daily.    . Blood Glucose Monitoring Suppl (BLOOD GLUCOSE MONITOR KIT) KIT Use to test blood sugar daily. 1 each 0  . carbamazepine (TEGRETOL) 200 MG tablet Take 1 tablet (200 mg total) by mouth 3 (three) times daily. 270 tablet 3  . FLUoxetine (PROZAC) 20 MG capsule Take 1 capsule (20 mg total) by mouth 2 (two) times daily. 180 capsule 2  . gemfibrozil (LOPID) 600 MG tablet Take 1 tablet (600 mg total) by mouth 2 (two) times daily before a meal. 180 tablet 3  . glucose blood  test strip Use to test blood sugar daily. 100 each 3  . HYDROcodone-acetaminophen (NORCO) 5-325 MG per tablet Take 1 tablet in evening prn foot pain. 30 tablet 0  . levothyroxine (SYNTHROID, LEVOTHROID) 50 MCG tablet Take one tablet by mouth one time daily 90 tablet 1  . lisinopril (PRINIVIL,ZESTRIL) 20 MG tablet Take 1 tablet (20 mg total) by mouth daily. 90 tablet 3  . omeprazole (PRILOSEC) 20 MG capsule Take 2 capsules once a day for nausea and reflux.  Use as needed- do not have to take every day 180 capsule 1  . rOPINIRole (REQUIP) 0.25 MG tablet Take 0.5 mg by mouth.     No current facility-administered medications on file prior to visit.    Review of Systems:  As per HPI- otherwise negative. Lab Results  Component Value Date   TSH 1.753 03/13/2015     Physical Examination: Filed Vitals:   09/19/15 1422  BP:  118/78  Pulse: 84  Temp: 98.1 F (36.7 C)  Resp: 16   Filed Vitals:   09/19/15 1422  Height: 5' 4.75" (1.645 m)  Weight: 162 lb 9.6 oz (73.755 kg)   Body mass index is 27.26 kg/(m^2). Ideal Body Weight: Weight in (lb) to have BMI = 25: 148.8  GEN: WDWN, NAD, Non-toxic, A & O x 3, mild overweight, looks well HEENT: Atraumatic, Normocephalic. Neck supple. No masses, No LAD. Ears and Nose: No external deformity. CV: RRR, No M/G/R. No JVD. No thrill. No extra heart sounds. PULM: CTA B, no wheezes, crackles, rhonchi. No retractions. No resp. distress. No accessory muscle use. EXTR: No c/c/e NEURO Normal gait.  PSYCH: Normally interactive. Conversant. Not depressed or anxious appearing.  Calm demeanor.    Wt Readings from Last 3 Encounters:  09/19/15 162 lb 9.6 oz (73.755 kg)  05/01/15 161 lb 9.6 oz (73.301 kg)  03/13/15 158 lb 12.8 oz (72.031 kg)     Assessment and Plan: Controlled type 2 diabetes mellitus without complication, without long-term current use of insulin (HCC) - Plan: Hemoglobin A1c  Essential hypertension - Plan: Comprehensive metabolic panel  Dyslipidemia - Plan: Lipid panel  Hypothyroidism, unspecified hypothyroidism type - Plan: TSH  Medication monitoring encounter - Plan: CBC with Differential/Platelet  Here today for a periodic recheck Fasting for labs Her podiatrist has requested a CBC  She is currently on diet control for her DM only BP looks good- will decrease her lisinopril to 10 mg Plan to recheck in 6 months unless there is any problem with her labs She is trying to lose weight but is having a hard time  Signed Lamar Blinks, MD

## 2015-09-19 NOTE — Progress Notes (Signed)
Pre visit review using our clinic review tool, if applicable. No additional management support is needed unless otherwise documented below in the visit note. 

## 2015-09-20 LAB — CBC WITH DIFFERENTIAL/PLATELET
BASOS PCT: 0.2 % (ref 0.0–3.0)
Basophils Absolute: 0 10*3/uL (ref 0.0–0.1)
EOS ABS: 0.3 10*3/uL (ref 0.0–0.7)
Eosinophils Relative: 5.1 % — ABNORMAL HIGH (ref 0.0–5.0)
HEMATOCRIT: 33.3 % — AB (ref 36.0–46.0)
Hemoglobin: 11.1 g/dL — ABNORMAL LOW (ref 12.0–15.0)
LYMPHS ABS: 1.7 10*3/uL (ref 0.7–4.0)
LYMPHS PCT: 25.8 % (ref 12.0–46.0)
MCHC: 33.4 g/dL (ref 30.0–36.0)
MCV: 90.6 fl (ref 78.0–100.0)
Monocytes Absolute: 0.4 10*3/uL (ref 0.1–1.0)
Monocytes Relative: 6.5 % (ref 3.0–12.0)
NEUTROS ABS: 4 10*3/uL (ref 1.4–7.7)
NEUTROS PCT: 62.4 % (ref 43.0–77.0)
Platelets: 293 10*3/uL (ref 150.0–400.0)
RBC: 3.68 Mil/uL — ABNORMAL LOW (ref 3.87–5.11)
RDW: 13.9 % (ref 11.5–15.5)
WBC: 6.4 10*3/uL (ref 4.0–10.5)

## 2015-09-20 LAB — COMPREHENSIVE METABOLIC PANEL
ALBUMIN: 4.9 g/dL (ref 3.5–5.2)
ALT: 8 U/L (ref 0–35)
AST: 14 U/L (ref 0–37)
Alkaline Phosphatase: 106 U/L (ref 39–117)
BILIRUBIN TOTAL: 0.3 mg/dL (ref 0.2–1.2)
BUN: 23 mg/dL (ref 6–23)
CO2: 26 mEq/L (ref 19–32)
CREATININE: 1.69 mg/dL — AB (ref 0.40–1.20)
Calcium: 9.9 mg/dL (ref 8.4–10.5)
Chloride: 103 mEq/L (ref 96–112)
GFR: 39.83 mL/min — ABNORMAL LOW (ref >60.00)
Glucose, Bld: 126 mg/dL — ABNORMAL HIGH (ref 70–99)
Potassium: 4.5 mEq/L (ref 3.5–5.1)
Sodium: 139 mEq/L (ref 135–145)
Total Protein: 7.8 g/dL (ref 6.0–8.3)

## 2015-09-20 LAB — LIPID PANEL
CHOLESTEROL: 297 mg/dL — AB (ref 0–200)
HDL: 54.4 mg/dL (ref >39.00)
LDL CALC: 208 mg/dL — AB (ref 0–99)
NonHDL: 242.13
TRIGLYCERIDES: 173 mg/dL — AB (ref 0.0–149.0)
Total CHOL/HDL Ratio: 5
VLDL: 34.6 mg/dL (ref 0.0–40.0)

## 2015-09-20 LAB — TSH: TSH: 1.21 u[IU]/mL (ref 0.35–4.50)

## 2015-09-20 LAB — HEMOGLOBIN A1C: Hgb A1c MFr Bld: 6.3 % (ref 4.6–6.5)

## 2015-09-22 ENCOUNTER — Encounter: Payer: Self-pay | Admitting: Family Medicine

## 2015-09-23 DIAGNOSIS — G629 Polyneuropathy, unspecified: Secondary | ICD-10-CM | POA: Diagnosis not present

## 2015-09-23 DIAGNOSIS — R5383 Other fatigue: Secondary | ICD-10-CM | POA: Diagnosis not present

## 2015-09-23 DIAGNOSIS — R5382 Chronic fatigue, unspecified: Secondary | ICD-10-CM | POA: Diagnosis not present

## 2015-09-23 DIAGNOSIS — Z6827 Body mass index (BMI) 27.0-27.9, adult: Secondary | ICD-10-CM | POA: Diagnosis not present

## 2015-11-28 ENCOUNTER — Other Ambulatory Visit: Payer: Self-pay | Admitting: Emergency Medicine

## 2015-11-28 DIAGNOSIS — K219 Gastro-esophageal reflux disease without esophagitis: Secondary | ICD-10-CM

## 2015-11-28 MED ORDER — OMEPRAZOLE 20 MG PO CPDR: DELAYED_RELEASE_CAPSULE | ORAL | Status: DC

## 2015-12-04 ENCOUNTER — Telehealth: Payer: Self-pay | Admitting: Emergency Medicine

## 2015-12-04 ENCOUNTER — Telehealth: Payer: Self-pay | Admitting: *Deleted

## 2015-12-04 MED ORDER — AMITRIPTYLINE HCL 100 MG PO TABS: 100.0000 mg | ORAL_TABLET | Freq: Every day | ORAL | Status: DC

## 2015-12-04 NOTE — Telephone Encounter (Signed)
Requesting Amitriptyline HCL 100mg -Take 1 tablet by mouth a bedtime. Last refill:10/24/14;#90,3-by Dr. Ellsworth Lennox Last OV:09/19/15 Please advise.//AB/CMA

## 2015-12-04 NOTE — Telephone Encounter (Signed)
[facility] called in requesting medication refill on medication Amitriptyline 100 mg tab Last prescribed 10/24/2015 By Dr. Leward Quan and last OV seen by Dr. Lorelei Pont 09/19/15 Pharmacy instructed pt will need to schedule OV with new provider for before RF

## 2016-01-09 ENCOUNTER — Other Ambulatory Visit: Payer: Self-pay | Admitting: Emergency Medicine

## 2016-01-09 MED ORDER — GEMFIBROZIL 600 MG PO TABS: 600.0000 mg | ORAL_TABLET | Freq: Two times a day (BID) | ORAL | Status: DC

## 2016-01-14 ENCOUNTER — Telehealth: Payer: Self-pay

## 2016-01-14 MED ORDER — LEVOTHYROXINE SODIUM 50 MCG PO TABS: ORAL_TABLET | ORAL | 1 refills | Status: DC

## 2016-01-14 NOTE — Telephone Encounter (Signed)
[facility] in [place] is calling because they faxed two prescription request for levothyroxine and have not heard anything. Please advise! [phone]

## 2016-01-14 NOTE — Telephone Encounter (Signed)
Rx sent 

## 2016-02-19 ENCOUNTER — Ambulatory Visit (INDEPENDENT_AMBULATORY_CARE_PROVIDER_SITE_OTHER): Payer: Medicare Other | Admitting: Family Medicine

## 2016-02-19 ENCOUNTER — Encounter: Payer: Self-pay | Admitting: Family Medicine

## 2016-02-19 VITALS — BP 132/88 | HR 86 | Temp 98.1°F | Ht 65.0 in | Wt 165.0 lb

## 2016-02-19 DIAGNOSIS — E1141 Type 2 diabetes mellitus with diabetic mononeuropathy: Secondary | ICD-10-CM | POA: Diagnosis not present

## 2016-02-19 DIAGNOSIS — I10 Essential (primary) hypertension: Secondary | ICD-10-CM

## 2016-02-19 DIAGNOSIS — E038 Other specified hypothyroidism: Secondary | ICD-10-CM | POA: Diagnosis not present

## 2016-02-19 DIAGNOSIS — E119 Type 2 diabetes mellitus without complications: Secondary | ICD-10-CM

## 2016-02-19 DIAGNOSIS — Z23 Encounter for immunization: Secondary | ICD-10-CM | POA: Diagnosis not present

## 2016-02-19 DIAGNOSIS — E785 Hyperlipidemia, unspecified: Secondary | ICD-10-CM

## 2016-02-19 DIAGNOSIS — E034 Atrophy of thyroid (acquired): Secondary | ICD-10-CM

## 2016-02-19 NOTE — Patient Instructions (Addendum)
Call [employer] - they make tegretol. They may be able to help you with your medication expenses SPEAK WITH A REPRESENTATIVE [phone]  Continue taking your current medications I'll be in touch with your labs asap

## 2016-02-19 NOTE — Progress Notes (Signed)
[place] at [facility] [address] [place], [place] [address] [phone] [id number] [phone]  Date:  02/19/2016   Name:  Caroline Simmons   DOB:  [birth year]   MRN:  [id number]  PCP:  Lamar Blinks, MD    Chief Complaint: Follow-up (Pt here for 6 month f/u. Pt will get flu vaccine today.)   History of Present Illness:  Caroline Simmons is a 60 y.o. very pleasant female patient who presents with the following:  Here today for a follow-up visit for her DM and HTN.    She is on tegretol for her peripheral neuropathy through [facility].  This is helping her but it is really expensive, she is worried about affording it She does not check her blood sugar at home as the strips are too expensive.   She is fasting today except for coffee.   She is taking 20 mg of lisinopril daily, never did cut back to 10 mg as we had discussed in the past Would like a flu shot today  BP Readings from Last 3 Encounters:  02/19/16 132/88  09/19/15 118/78  05/01/15 (!) 130/98     Lab Results  Component Value Date   HGBA1C 6.3 09/19/2015     Patient Active Problem List   Diagnosis Date Noted  . Dyslipidemia associated with type 2 diabetes mellitus ([employer]) 05/30/2013  . Detached retina, left 11/25/2011  . Depression with anxiety 11/25/2011  . Neuropathy in diabetes ([place]) 09/15/2011  . Insomnia, unspecified   . Hypothyroidism   . DIABETES MELLITUS, TYPE II 08/26/2010  . HYPERTENSION 08/26/2010  . RECTAL PAIN 08/26/2010    Past Medical History:  Diagnosis Date  . Depression   . Essential hypertension, benign   . Hypothyroid   . Insomnia, unspecified   . Menopause    lmp 06/2007  . Type II or unspecified type diabetes mellitus without mention of complication, not stated as uncontrolled     Past Surgical History:  Procedure Laterality Date  . [place]  . EYE SURGERY    . RETINAL DETACHMENT REPAIR W/ SCLERAL BUCKLE LE  11/12/2011     Social History  Substance Use Topics  . Smoking status: Former Smoker    Packs/day: 1.00    Years: 3.00    Types: Cigarettes  . Smokeless tobacco: Not on file     Comment: quit 08/2003  . Alcohol use Yes     Comment: rare-beer or wine    Family History  Problem Relation Age of Onset  . Diabetes    . Hypertension    . Diabetes Mother     type 2  not sure onset    Allergies  Allergen Reactions  . Codeine Nausea Only    Pt states she has taken this with no problems.    Medication list has been reviewed and updated.  Current Outpatient Prescriptions on File Prior to Visit  Medication Sig Dispense Refill  . amitriptyline (ELAVIL) 100 MG tablet Take 1 tablet (100 mg total) by mouth at bedtime. 90 tablet 3  . aspirin 81 MG tablet Take 81 mg by mouth daily.    . carbamazepine (TEGRETOL) 200 MG tablet Take 1 tablet (200 mg total) by mouth 3 (three) times daily. 270 tablet 3  . HYDROcodone-acetaminophen (NORCO) 5-325 MG per tablet Take 1 tablet in evening prn foot pain. 30 tablet 0  . levothyroxine (SYNTHROID, LEVOTHROID) 50 MCG tablet Take one tablet by mouth  one time daily 90 tablet 1  . omeprazole (PRILOSEC) 20 MG capsule Take 2 capsules once a day for nausea and reflux.  Use as needed- do not have to take every day 180 capsule 1  . rOPINIRole (REQUIP) 0.25 MG tablet Take 0.5 mg by mouth.     No current facility-administered medications on file prior to visit.     Review of Systems:  As per HPI- otherwise negative.   Physical Examination: Vitals:   02/19/16 1447  BP: 132/88  Pulse: 86  Temp: 98.1 F (36.7 C)   Vitals:   02/19/16 1447  Weight: 165 lb (74.8 kg)  Height: 5\' 5"  (1.651 m)   Body mass index is 27.46 kg/m. Ideal Body Weight: Weight in (lb) to have BMI = 25: 149.9  GEN: WDWN, NAD, Non-toxic, A & O x 3, overweight, looks well HEENT: Atraumatic, Normocephalic. Neck supple. No masses, No LAD. Ears and Nose: No external deformity. CV: RRR, No  M/G/R. No JVD. No thrill. No extra heart sounds. PULM: CTA B, no wheezes, crackles, rhonchi. No retractions. No resp. distress. No accessory muscle use. EXTR: No c/c/e NEURO Normal gait.  PSYCH: Normally interactive. Conversant. Not depressed or anxious appearing.  Calm demeanor.   Assessment and Plan: Controlled type 2 diabetes mellitus without complication, without long-term current use of insulin ([place]) - Plan: Comprehensive metabolic panel, Hemoglobin A1c  Hypothyroidism due to acquired atrophy of thyroid - Plan: TSH  Dyslipidemia - Plan: Lipid panel  Diabetic mononeuropathy associated with type 2 diabetes mellitus ([place])  Need for prophylactic vaccination and inoculation against influenza  Essential hypertension  Check her A1c, lipids, and TSH today Flu shot given Clarified that she is still taking lisinopril- she will continue to take 20 mg as her BP is ok Will plan further follow- up pending labs. Gave her contact info to try and get assistance with tegretol  Remind pt that she is due for an eye exam with labs   Signed Lamar Blinks, MD

## 2016-02-19 NOTE — Progress Notes (Signed)
Pre visit review using our clinic review tool, if applicable. No additional management support is needed unless otherwise documented below in the visit note. 

## 2016-02-20 LAB — COMPREHENSIVE METABOLIC PANEL
ALBUMIN: 4.7 g/dL (ref 3.5–5.2)
ALK PHOS: 121 U/L — AB (ref 39–117)
ALT: 10 U/L (ref 0–35)
AST: 15 U/L (ref 0–37)
BUN: 23 mg/dL (ref 6–23)
CHLORIDE: 101 meq/L (ref 96–112)
CO2: 27 mEq/L (ref 19–32)
Calcium: 9.4 mg/dL (ref 8.4–10.5)
Creatinine, Ser: 1.48 mg/dL — ABNORMAL HIGH (ref 0.40–1.20)
GFR: 46.35 mL/min — AB (ref >60.00)
Glucose, Bld: 124 mg/dL — ABNORMAL HIGH (ref 70–99)
POTASSIUM: 5 meq/L (ref 3.5–5.1)
SODIUM: 138 meq/L (ref 135–145)
TOTAL PROTEIN: 7.7 g/dL (ref 6.0–8.3)
Total Bilirubin: 0.3 mg/dL (ref 0.2–1.2)

## 2016-02-20 LAB — TSH: TSH: 1.01 u[IU]/mL (ref 0.35–4.50)

## 2016-02-20 LAB — LIPID PANEL
CHOL/HDL RATIO: 6
CHOLESTEROL: 307 mg/dL — AB (ref 0–200)
HDL: 48.3 mg/dL (ref >39.00)
NonHDL: 258.84
TRIGLYCERIDES: 221 mg/dL — AB (ref 0.0–149.0)
VLDL: 44.2 mg/dL — AB (ref 0.0–40.0)

## 2016-02-20 LAB — LDL CHOLESTEROL, DIRECT: Direct LDL: 170 mg/dL

## 2016-02-20 LAB — HEMOGLOBIN A1C: Hgb A1c MFr Bld: 6.3 % (ref 4.6–6.5)

## 2016-02-21 ENCOUNTER — Encounter: Payer: Self-pay | Admitting: Family Medicine

## 2016-02-21 NOTE — Progress Notes (Signed)
Called to discuss her cholesterol- no answer.  Will send letter

## 2016-02-27 ENCOUNTER — Telehealth: Payer: Self-pay | Admitting: Family Medicine

## 2016-02-27 NOTE — Telephone Encounter (Signed)
°  Relationship to patient: Self Can be reached: [phone]  Pharmacy:  Reason for call: Patient called to inform provider that she is currently taking Gemfibrozil 600mg  tablets twice a day. States that provider was not showing that she was taking it at her last appt

## 2016-02-27 NOTE — Telephone Encounter (Signed)
Gemfibrozil 600mg  tablets added to pt's med list per pt.

## 2016-03-16 ENCOUNTER — Telehealth: Payer: Self-pay | Admitting: Family Medicine

## 2016-03-16 NOTE — Telephone Encounter (Signed)
Patient Relation: Self  Patient Phone: [phone]  Patient would like a call to go over her lab results for 8/17.

## 2016-03-16 NOTE — Telephone Encounter (Signed)
Called pt back to discuss lab results from 02/19/16. Pt states that she takes gemfibrozil 600 mg one tablet by mouth twice daily for her cholesterol. Also she believes that she had a kidney doctor in the past but can't remember who it was and will be ok with you sending a referral for her to see a kidney doctor to establish care.

## 2016-03-23 DIAGNOSIS — G629 Polyneuropathy, unspecified: Secondary | ICD-10-CM | POA: Diagnosis not present

## 2016-03-23 DIAGNOSIS — R5382 Chronic fatigue, unspecified: Secondary | ICD-10-CM | POA: Diagnosis not present

## 2016-03-23 DIAGNOSIS — Z6828 Body mass index (BMI) 28.0-28.9, adult: Secondary | ICD-10-CM | POA: Diagnosis not present

## 2016-03-24 ENCOUNTER — Telehealth: Payer: Self-pay | Admitting: Family Medicine

## 2016-03-24 NOTE — Telephone Encounter (Signed)
I spoke to pt on 9/25 about her cholesterol. Tried to contact pt to find out exactly what questions she has about her cholesterol level and renal US. No answer, left message for pt to return call.

## 2016-03-24 NOTE — Telephone Encounter (Signed)
Relation to [id number] Call back number:[phone]  Reason for call:  Patient would like to discuss her choelesterol level and renal US. Please advise

## 2016-03-26 ENCOUNTER — Telehealth: Payer: Self-pay | Admitting: Family Medicine

## 2016-03-26 DIAGNOSIS — N189 Chronic kidney disease, unspecified: Secondary | ICD-10-CM

## 2016-03-26 NOTE — Telephone Encounter (Signed)
Caller name: Relationship to patient: Self Can be reached: [phone] Pharmacy:  Reason for call: Patient would like call back about recent labs

## 2016-03-27 NOTE — Telephone Encounter (Signed)
Called her back- she is concerned about her renal function. However, she would like to see a nephrologist in [place]. I will make this referral for her Also she is taking gemfibrozil for her cholesterol.  I am not entirely clear on how long she has been taking this. Asked her to come and see me in about 2 months to recheck fasting labs

## 2016-03-27 NOTE — Telephone Encounter (Signed)
Called pt and scheduled 2 month follow with fasting labs. Pt requested afternoon appt. Informed pt that she will need to fast for lab work. Pt verbalized understanding and has no additional questions at this time.

## 2016-03-27 NOTE — Telephone Encounter (Signed)
-----   Message from Darreld Mclean, MD sent at 03/27/2016  9:48 AM EDT ----- Please give her a call and get her scheduled to see me in a couple of months, would like for her to be fasting for labs  Thank you!

## 2016-04-02 DIAGNOSIS — N183 Chronic kidney disease, stage 3 (moderate): Secondary | ICD-10-CM | POA: Diagnosis not present

## 2016-04-02 DIAGNOSIS — I129 Hypertensive chronic kidney disease with stage 1 through stage 4 chronic kidney disease, or unspecified chronic kidney disease: Secondary | ICD-10-CM | POA: Diagnosis not present

## 2016-04-02 DIAGNOSIS — R808 Other proteinuria: Secondary | ICD-10-CM | POA: Diagnosis not present

## 2016-04-02 DIAGNOSIS — R809 Proteinuria, unspecified: Secondary | ICD-10-CM | POA: Insufficient documentation

## 2016-04-02 DIAGNOSIS — E785 Hyperlipidemia, unspecified: Secondary | ICD-10-CM | POA: Insufficient documentation

## 2016-04-02 DIAGNOSIS — E1142 Type 2 diabetes mellitus with diabetic polyneuropathy: Secondary | ICD-10-CM | POA: Diagnosis not present

## 2016-04-02 DIAGNOSIS — E1122 Type 2 diabetes mellitus with diabetic chronic kidney disease: Secondary | ICD-10-CM | POA: Diagnosis not present

## 2016-04-08 ENCOUNTER — Encounter: Payer: Self-pay | Admitting: Family Medicine

## 2016-04-08 DIAGNOSIS — N183 Chronic kidney disease, stage 3 unspecified: Secondary | ICD-10-CM | POA: Insufficient documentation

## 2016-05-09 ENCOUNTER — Other Ambulatory Visit: Payer: Self-pay | Admitting: Family Medicine

## 2016-05-11 ENCOUNTER — Other Ambulatory Visit: Payer: Self-pay | Admitting: Emergency Medicine

## 2016-05-11 MED ORDER — GEMFIBROZIL 600 MG PO TABS: 600.0000 mg | ORAL_TABLET | Freq: Two times a day (BID) | ORAL | 1 refills | Status: DC

## 2016-05-17 ENCOUNTER — Other Ambulatory Visit: Payer: Self-pay | Admitting: Family Medicine

## 2016-05-27 ENCOUNTER — Ambulatory Visit (INDEPENDENT_AMBULATORY_CARE_PROVIDER_SITE_OTHER): Payer: Medicare Other | Admitting: Family Medicine

## 2016-05-27 ENCOUNTER — Encounter: Payer: Self-pay | Admitting: Family Medicine

## 2016-05-27 VITALS — BP 140/95 | HR 98 | Resp 14 | Ht 64.25 in | Wt 162.4 lb

## 2016-05-27 DIAGNOSIS — N189 Chronic kidney disease, unspecified: Secondary | ICD-10-CM | POA: Diagnosis not present

## 2016-05-27 DIAGNOSIS — F418 Other specified anxiety disorders: Secondary | ICD-10-CM

## 2016-05-27 DIAGNOSIS — E034 Atrophy of thyroid (acquired): Secondary | ICD-10-CM

## 2016-05-27 DIAGNOSIS — Z780 Asymptomatic menopausal state: Secondary | ICD-10-CM

## 2016-05-27 DIAGNOSIS — E1122 Type 2 diabetes mellitus with diabetic chronic kidney disease: Secondary | ICD-10-CM

## 2016-05-27 DIAGNOSIS — E1141 Type 2 diabetes mellitus with diabetic mononeuropathy: Secondary | ICD-10-CM

## 2016-05-27 DIAGNOSIS — E1140 Type 2 diabetes mellitus with diabetic neuropathy, unspecified: Secondary | ICD-10-CM | POA: Diagnosis not present

## 2016-05-27 DIAGNOSIS — Z1239 Encounter for other screening for malignant neoplasm of breast: Secondary | ICD-10-CM

## 2016-05-27 DIAGNOSIS — Z0000 Encounter for general adult medical examination without abnormal findings: Secondary | ICD-10-CM | POA: Diagnosis not present

## 2016-05-27 DIAGNOSIS — Z1231 Encounter for screening mammogram for malignant neoplasm of breast: Secondary | ICD-10-CM

## 2016-05-27 DIAGNOSIS — E1142 Type 2 diabetes mellitus with diabetic polyneuropathy: Secondary | ICD-10-CM

## 2016-05-27 NOTE — Progress Notes (Addendum)
Subjective:   Caroline Simmons is a 60 y.o. female who presents for an Initial Medicare Annual Wellness Visit.  Review of Systems    No ROS.  Medicare Wellness Visit.  Cardiac Risk Factors include: diabetes mellitus  Sleep patterns: Falls asleep 4am-1pm. Has trouble sleeping d/t chronic pain.  Home Safety/Smoke Alarms: Lives in home and feels safe. Smoke alarms in place.    Seat Belt Safety/Bike Helmet: Wears seat belt.    Counseling:   Eye Exam- Follows w/ [facility] yearly Dental- Sees dentist in [place] every 6 months  Female:   Pap- Follows w/ Dr. Marissa Calamity- Last 2016, normal per pt. No report on file.     Dexa scan- Never       CCS- last 08/27/10 w/ Dr. Verl Blalock. Scatter diverticula and 1 polyp removed. 10 year recall.    Objective:    Today's Vitals   05/27/16 1413 05/27/16 1415  BP: (!) 150/100 (!) 140/95  Pulse: 98   Resp: 14   SpO2: 98%   Weight: 162 lb 6.4 oz (73.7 kg)   Height: 5' 4.25" (1.632 m)   PainSc:  10-Worst pain ever   Body mass index is 27.66 kg/m.   Current Medications (verified) Outpatient Encounter Prescriptions as of 05/27/2016  Medication Sig  . amitriptyline (ELAVIL) 100 MG tablet Take 1 tablet (100 mg total) by mouth at bedtime.  Marland Kitchen aspirin 81 MG tablet Take 81 mg by mouth daily.  . carbamazepine (TEGRETOL) 200 MG tablet Take 1 tablet (200 mg total) by mouth 3 (three) times daily. (Patient taking differently: Take 200 mg by mouth 2 (two) times daily. )  . gemfibrozil (LOPID) 600 MG tablet Take 1 tablet (600 mg total) by mouth 2 (two) times daily before a meal.  . HYDROcodone-acetaminophen (NORCO) 5-325 MG per tablet Take 1 tablet in evening prn foot pain.  Marland Kitchen levothyroxine (SYNTHROID, LEVOTHROID) 50 MCG tablet Take one tablet by mouth one time daily  . lisinopril (PRINIVIL,ZESTRIL) 20 MG tablet Take 20 mg by mouth daily.  Marland Kitchen omeprazole (PRILOSEC) 20 MG capsule Take 2 capsules once a day for nausea and reflux.  Use as needed-  do not have to take every day   No facility-administered encounter medications on file as of 05/27/2016.     Allergies (verified) Codeine and Lamotrigine   History: Past Medical History:  Diagnosis Date  . Depression   . Essential hypertension, benign   . Hypothyroid   . Insomnia, unspecified   . Menopause    lmp 06/2007  . Type II or unspecified type diabetes mellitus without mention of complication, not stated as uncontrolled    Past Surgical History:  Procedure Laterality Date  . [place]  . EYE SURGERY    . RETINAL DETACHMENT REPAIR W/ SCLERAL BUCKLE LE  11/12/2011   Family History  Problem Relation Age of Onset  . Diabetes Mother     type 2  not sure onset  . Diabetes    . Hypertension     Social History   Occupational History  . Not on file.   Social History Main Topics  . Smoking status: Former Smoker    Packs/day: 1.00    Years: 3.00    Types: Cigarettes  . Smokeless tobacco: Never Used     Comment: quit 08/2003  . Alcohol use Yes     Comment: rare-beer or wine  . Drug use: No  . Sexual  activity: Not Currently    Tobacco Counseling Counseling given: Not Answered   Activities of Daily Living In your present state of health, do you have any difficulty performing the following activities: 05/27/2016  Hearing? N  Vision? N  Difficulty concentrating or making decisions? Y  Walking or climbing stairs? N  Dressing or bathing? N  Doing errands, shopping? N  Preparing Food and eating ? N  Using the Toilet? N  In the past six months, have you accidently leaked urine? N  Do you have problems with loss of bowel control? N  Managing your Medications? N  Managing your Finances? N  Housekeeping or managing your Housekeeping? N  Some recent data might be hidden    Immunizations and Health Maintenance Immunization History  Administered Date(s) Administered  . Influenza Split 05/13/2012  . Influenza,inj,Quad PF,36+ Mos 05/30/2013,  04/05/2014, 03/13/2015, 02/19/2016  . Influenza-Unspecified 04/08/2011  . Pneumococcal Polysaccharide-23 05/01/2015  . Td 09/20/2006   Health Maintenance Due  Topic Date Due  . HIV Screening  09/04/1971  . OPHTHALMOLOGY EXAM  09/25/2015  . FOOT EXAM  03/12/2016    Patient Care Team: Darreld Mclean, MD as PCP - General (Family Medicine) Newton Pigg, MD as Consulting Physician (Obstetrics and Gynecology)  Indicate any recent Medical Services you may have received from other than [facility] providers in the past year (date may be approximate).     Assessment:   This is a routine wellness examination for Shavanda. Physical assessment deferred to PCP.  Hearing/Vision screen  Visual Acuity Screening   Right eye Left eye Both eyes  Without correction:     With correction: 20/25 20/50 20/50   Comments: [facility] yearly. Wearing glasses today. No vision issues reported. H/o retinal detachment in L eye.  Hearing Screening Comments: Able to hear conversational tones w/o difficulty. No issues reported.    Dietary issues and exercise activities discussed: Current Exercise Habits: Home exercise routine, Type of exercise: Other - see comments Wellsite geologist), Time (Minutes): 60, Frequency (Times/Week): 5, Weekly Exercise (Minutes/Week): 300, Intensity: Moderate, Exercise limited by: neurologic condition(s) (Neuropathy)  Diet (meal preparation, eat out, water intake, caffeinated beverages, dairy products, fruits and vegetables): in general, a "healthy" diet  , well balanced, on average, 2 meals per day, diabetic, low fat/ cholesterol. Salmon/fish/chicken. Able to prepare meals. Drinks water throughout the day. 1 cup coffee.      Goals    . Decrease foot pain      Depression Screen PHQ 2/9 Scores 05/27/2016 05/01/2015 03/13/2015 10/09/2014 04/05/2014 10/04/2013  PHQ - 2 Score 3 0 2 0 0 5  PHQ- 9 Score 11 0 8 - - 10    Fall Risk Fall Risk  05/27/2016 05/01/2015 03/13/2015 10/09/2014 04/05/2014    Falls in the past year? Yes No No Yes Yes  Number falls in past yr: 1 - - 1 1  Injury with Fall? No - - - Yes    Cognitive Function: MMSE - Mini Mental State Exam 05/27/2016  Orientation to time 5  Orientation to Place 5  Registration 3  Attention/ Calculation 4  Recall 2  Language- name 2 objects 2  Language- repeat 1  Language- follow 3 step command 3  Language- read & follow direction 1  Write a sentence 1  Copy design 1  Total score 28        Screening Tests Health Maintenance  Topic Date Due  . HIV Screening  09/04/1971  . OPHTHALMOLOGY EXAM  09/25/2015  . FOOT  EXAM  03/12/2016  . MAMMOGRAM  06/22/2016  . HEMOGLOBIN A1C  08/19/2016  . TETANUS/TDAP  09/19/2016  . PAP SMEAR  02/20/2018  . PNEUMOCOCCAL POLYSACCHARIDE VACCINE (2) 04/30/2020  . COLONOSCOPY  06/22/2020  . INFLUENZA VACCINE  Completed  . Hepatitis C Screening  Completed      Plan:    Follow-up w/ Dr. Lorelei Pont as directed. Continue exercising as tolerated.  DEXA and MMG ordered today. Foot exam per PCP as pt has c/o foot pain today and I do not want to subject her to 2 foot exams in one day since she is seeing PCP after appt with me-PCP aware. Bring a copy of your advance directives to your next office visit.  During the course of the visit, Gwendolyne was educated and counseled about the following appropriate screening and preventive services:   Vaccines to include Pneumoccal, Influenza, Hepatitis B, Td, Zostavax, HCV  Cardiovascular disease screening  Colorectal cancer screening  Bone density screening  Diabetes screening  Glaucoma screening  Mammography/PAP  Nutrition counseling  Patient Instructions (the written plan) were given to the patient.    Dorrene German, RN   05/27/2016    I have reviewed with above and agree- J Copland MD 06/04/16

## 2016-05-27 NOTE — Assessment & Plan Note (Signed)
PHQ-9=11 today. PCP aware. Pt reports depression r/t painful neuropathy of bilateral feet. States she has tried numerous medications in the past and has never had complete relief of pain. She voices frustration that the medications that seem to work best for her also have sedating side effects. States depressive symptoms are worst at night when she cannot sleep. Denies SI/HI. Ongoing management per PCP.

## 2016-05-27 NOTE — Progress Notes (Addendum)
[place] at [facility] [address], [place], [place] [address] [phone]  Date:  05/27/2016   Name:  Donnella Lantigua   DOB:  [birth year]   MRN:  [id number]  PCP:  Lamar Blinks, MD    Chief Complaint: Medicare Wellness; Pain (Ongoing neuropathy of bilateral feet); and Depression (PHQ-9=11 today. Pt reports depression r/t chronic pain and having to take medications that make her feel groggy.)   History of Present Illness:  Cash Snuggs is a 60 y.o. very pleasant female patient who presents with the following:  Last seen by myself in August- I referred her to nephrology due to CKD.    Lab Results  Component Value Date   HGBA1C 6.3 02/19/2016   She has stage 3 CKD- she recently saw nephrology - Dr. Delice Lesch with [facility]- and they are following her progress  Her mother is in the hospital in [place]- she is ill with what sounds like a severe DM exacerbation.  Cybill thinks this may be why she is feeing stressed and why her BP is high.  She plans to go and visit her mom soon and hopes that she will feel better after checking on her personally   BP Readings from Last 3 Encounters:  05/27/16 (!) 150/100  02/19/16 132/88  09/19/15 118/78   She notes that her feet hurt her all the time.  She as severe peripheral neuropathy. This does cause her to feel depressed some of the time as she cannot get away from this pain.   She stopped taking her prozac about one year ago when she ran out.   Discussed starting her back on this medication. She is hesistant as she feels like she already takes too many pills.  She denies any SI.  She hopes that she will feel better after she goes to visit her mom and sees for herself how she is doing - she will reconsider going back on prozac when she gets home She is on tegretol, amitriptyline and hydrocodone for her foot pain She is also on lisinopril, gemfibrozil, and synthroid  Lab Results   Component Value Date   TSH 1.01 02/19/2016    Patient Active Problem List   Diagnosis Date Noted  . Chronic kidney disease, stage 3 04/08/2016  . Dyslipidemia associated with type 2 diabetes mellitus ([place]) 05/30/2013  . Detached retina, left 11/25/2011  . Depression with anxiety 11/25/2011  . Neuropathy in diabetes ([place]) 09/15/2011  . Insomnia, unspecified   . Hypothyroidism   . DIABETES MELLITUS, TYPE II 08/26/2010  . HYPERTENSION 08/26/2010  . RECTAL PAIN 08/26/2010    Past Medical History:  Diagnosis Date  . Depression   . Essential hypertension, benign   . Hypothyroid   . Insomnia, unspecified   . Menopause    lmp 06/2007  . Type II or unspecified type diabetes mellitus without mention of complication, not stated as uncontrolled     Past Surgical History:  Procedure Laterality Date  . [place]  . EYE SURGERY    . RETINAL DETACHMENT REPAIR W/ SCLERAL BUCKLE LE  11/12/2011    Social History  Substance Use Topics  . Smoking status: Former Smoker    Packs/day: 1.00    Years: 3.00    Types: Cigarettes  . Smokeless tobacco: Never Used     Comment: quit 08/2003  . Alcohol use Yes     Comment: rare-beer or wine  Family History  Problem Relation Age of Onset  . Diabetes Mother     type 2  not sure onset  . Diabetes    . Hypertension      Allergies  Allergen Reactions  . Codeine Nausea Only    Pt states she has taken this with no problems.  . Lamotrigine Rash    Other reaction(s): RASH    Medication list has been reviewed and updated.  Current Outpatient Prescriptions on File Prior to Visit  Medication Sig Dispense Refill  . amitriptyline (ELAVIL) 100 MG tablet Take 1 tablet (100 mg total) by mouth at bedtime. 90 tablet 3  . aspirin 81 MG tablet Take 81 mg by mouth daily.    . carbamazepine (TEGRETOL) 200 MG tablet Take 1 tablet (200 mg total) by mouth 3 (three) times daily. (Patient taking differently: Take 200 mg by mouth 2  (two) times daily. ) 270 tablet 3  . gemfibrozil (LOPID) 600 MG tablet Take 1 tablet (600 mg total) by mouth 2 (two) times daily before a meal. 180 tablet 1  . HYDROcodone-acetaminophen (NORCO) 5-325 MG per tablet Take 1 tablet in evening prn foot pain. 30 tablet 0  . levothyroxine (SYNTHROID, LEVOTHROID) 50 MCG tablet Take one tablet by mouth one time daily 90 tablet 1  . lisinopril (PRINIVIL,ZESTRIL) 20 MG tablet Take 20 mg by mouth daily.    Mar[place] Kitchen omeprazole (PRILOSEC) 20 MG capsule Take 2 capsules once a day for nausea and reflux.  Use as needed- do not have to take every day 180 capsule 1   No current facility-administered medications on file prior to visit.     Review of Systems:  As per HPI- otherwise negative.   Physical Examination: Vitals:   05/27/16 1413  BP: (!) 150/100  Pulse: 98  Resp: 14   Vitals:   05/27/16 1413  Weight: 162 lb 6.4 oz (73.7 kg)  Height: 5' 4.25" (1.632 m)   Body mass index is 27.66 kg/m. Ideal Body Weight: Weight in (lb) to have BMI = 25: 146.5  GEN: WDWN, NAD, Non-toxic, A & O x 3, mild overweight, looks well HEENT: Atraumatic, Normocephalic. Neck supple. No masses, No LAD. Ears and Nose: No external deformity. CV: RRR, No M/G/R. No JVD. No thrill. No extra heart sounds. PULM: CTA B, no wheezes, crackles, rhonchi. No retractions. No resp. distress. No accessory muscle use. EXTR: No c/c/e NEURO Normal gait.  PSYCH: Normally interactive. Conversant. Not depressed or anxious appearing.  Calm demeanor.  Foot exam today- no pain to touch and circulation is good. However she has very little sensation and cannot perceive monofilament at most testing sites    Assessment and Plan: Chronic kidney disease, unspecified CKD stage  Hypothyroidism due to acquired atrophy of thyroid  Diabetic mononeuropathy associated with type 2 diabetes mellitus ([place])  Controlled type 2 diabetes mellitus with diabetic polyneuropathy, without long-term current use of  insulin ([place])  Encounter for Medicare annual wellness exam  Depression with anxiety  Asymptomatic postmenopausal state - Plan: DG BONE DENSITY (DXA)  Screening for malignant neoplasm of breast - Plan: MM Digital Screening  Here today to have her medicare annual wellness exam and also to discuss her other chronic health problems  Lab Results  Component Value Date   HGBA1C 6.3 02/19/2016   Lab Results  Component Value Date   TSH 1.01 02/19/2016    Her most recent A1c showed good control-  Will have her come back in 2 months for follow-up  See patient instructions for more details.    Signed Lamar Blinks, MD

## 2016-05-27 NOTE — Patient Instructions (Signed)
It was good to see you today-  I am sorry that your mom is sick- please let me know if your mood does not improve after you return from visiting her You have chronic renal disease- this means that your kidneys do not work at 100% strength; at this time we are monitoring this problem and avoiding medications that can harm your kidneys.  We also want to keep your blood pressure and blood sugar under control.    Your most recent A1c (diabetes check) and thyroid check were fine- done at the end of August  Please come and see me in 2 months to check on how you are doing- sooner if you are not feeling better

## 2016-06-08 ENCOUNTER — Other Ambulatory Visit: Payer: Self-pay | Admitting: Family Medicine

## 2016-06-08 DIAGNOSIS — I10 Essential (primary) hypertension: Secondary | ICD-10-CM

## 2016-06-23 ENCOUNTER — Ambulatory Visit (HOSPITAL_BASED_OUTPATIENT_CLINIC_OR_DEPARTMENT_OTHER)
Admission: RE | Admit: 2016-06-23 | Discharge: 2016-06-23 | Disposition: A | Discharge status: Home / Self Care | Payer: Medicare HMO | Source: Ambulatory Visit | Attending: Family Medicine | Admitting: Family Medicine

## 2016-06-23 ENCOUNTER — Encounter: Payer: Self-pay | Admitting: Family Medicine

## 2016-06-23 ENCOUNTER — Encounter (HOSPITAL_BASED_OUTPATIENT_CLINIC_OR_DEPARTMENT_OTHER): Payer: Self-pay

## 2016-06-23 DIAGNOSIS — Z1239 Encounter for other screening for malignant neoplasm of breast: Secondary | ICD-10-CM

## 2016-06-23 DIAGNOSIS — Z780 Asymptomatic menopausal state: Secondary | ICD-10-CM | POA: Diagnosis not present

## 2016-06-23 DIAGNOSIS — Z1231 Encounter for screening mammogram for malignant neoplasm of breast: Secondary | ICD-10-CM | POA: Insufficient documentation

## 2016-07-29 ENCOUNTER — Other Ambulatory Visit: Payer: Self-pay | Admitting: Family Medicine

## 2016-07-29 ENCOUNTER — Encounter: Payer: Self-pay | Admitting: Family Medicine

## 2016-07-29 ENCOUNTER — Ambulatory Visit (INDEPENDENT_AMBULATORY_CARE_PROVIDER_SITE_OTHER): Payer: Medicare HMO | Admitting: Family Medicine

## 2016-07-29 VITALS — BP 137/89 | HR 93 | Temp 97.4°F | Ht 64.25 in | Wt 160.4 lb

## 2016-07-29 DIAGNOSIS — N189 Chronic kidney disease, unspecified: Secondary | ICD-10-CM | POA: Diagnosis not present

## 2016-07-29 DIAGNOSIS — Z130 Encounter for screening for diseases of the blood and blood-forming organs and certain disorders involving the immune mechanism: Secondary | ICD-10-CM | POA: Diagnosis not present

## 2016-07-29 DIAGNOSIS — E785 Hyperlipidemia, unspecified: Secondary | ICD-10-CM | POA: Diagnosis not present

## 2016-07-29 DIAGNOSIS — E1141 Type 2 diabetes mellitus with diabetic mononeuropathy: Secondary | ICD-10-CM

## 2016-07-29 DIAGNOSIS — E034 Atrophy of thyroid (acquired): Secondary | ICD-10-CM

## 2016-07-29 DIAGNOSIS — K219 Gastro-esophageal reflux disease without esophagitis: Secondary | ICD-10-CM

## 2016-07-29 DIAGNOSIS — J3089 Other allergic rhinitis: Secondary | ICD-10-CM

## 2016-07-29 NOTE — Progress Notes (Signed)
[place] at [facility] [place], [place], [place] [address] [phone]  Date:  07/29/2016   Name:  Caroline Simmons   DOB:  Jan 26, 1956   MRN:  [id number]  PCP:  Lamar Blinks, MD    Chief Complaint: Follow-up   History of Present Illness:  Caroline Simmons is a 61 y.o. very pleasant female patient who presents with the following:  I last saw her about 2 months ago History of CKD, dyslipidemia- here today to recheck these issues   For 2 weeks she has noted sneezing, nasal congestion, and blood tinged nasal mucus when she blows her nose No fever, no cough She does have a scratchy throat She has not tried any meds for allergies as of yet- she was not sure what she should try  BP Readings from Last 3 Encounters:  07/29/16 137/89  05/27/16 (!) 140/95  02/19/16 132/88   Lab Results  Component Value Date   HGBA1C 6.3 02/19/2016   Her last cholesterol was in August- she is fasting today so we will check this for her today  She has seen her nephrologist- she is seeing her again tomorrow.  She sees Dr. Audie Clear tomorrow- her appt is at 2:30 pm [phone] (Fax for Dr. Audie Clear  Patient Active Problem List   Diagnosis Date Noted  . Chronic kidney disease, stage 3 04/08/2016  . Dyslipidemia associated with type 2 diabetes mellitus ([place]) 05/30/2013  . Detached retina, left 11/25/2011  . Depression with anxiety 11/25/2011  . Neuropathy in diabetes ([place]) 09/15/2011  . Insomnia, unspecified   . Hypothyroidism   . DIABETES MELLITUS, TYPE II 08/26/2010  . HYPERTENSION 08/26/2010  . RECTAL PAIN 08/26/2010    Past Medical History:  Diagnosis Date  . Depression   . Essential hypertension, benign   . Hypothyroid   . Insomnia, unspecified   . Menopause    lmp 06/2007  . Type II or unspecified type diabetes mellitus without mention of complication, not stated as uncontrolled     Past Surgical History:  Procedure Laterality Date   . [place]  . EYE SURGERY    . RETINAL DETACHMENT REPAIR W/ SCLERAL BUCKLE LE  11/12/2011    Social History  Substance Use Topics  . Smoking status: Former Smoker    Packs/day: 1.00    Years: 3.00    Types: Cigarettes  . Smokeless tobacco: Never Used     Comment: quit 08/2003  . Alcohol use Yes     Comment: rare-beer or wine    Family History  Problem Relation Age of Onset  . Diabetes Mother     type 2  not sure onset  . Diabetes    . Hypertension      Allergies  Allergen Reactions  . Codeine Nausea Only    Pt states she has taken this with no problems.  . Lamotrigine Rash    Other reaction(s): RASH    Medication list has been reviewed and updated.  Current Outpatient Prescriptions on File Prior to Visit  Medication Sig Dispense Refill  . amitriptyline (ELAVIL) 100 MG tablet Take 1 tablet (100 mg total) by mouth at bedtime. 90 tablet 3  . aspirin 81 MG tablet Take 81 mg by mouth daily.    . carbamazepine (TEGRETOL) 200 MG tablet Take 1 tablet (200 mg total) by mouth 3 (three) times daily. (Patient taking differently: Take 200 mg by mouth 2 (two) times daily. )  270 tablet 3  . gemfibrozil (LOPID) 600 MG tablet Take 1 tablet (600 mg total) by mouth 2 (two) times daily before a meal. 180 tablet 1  . HYDROcodone-acetaminophen (NORCO) 5-325 MG per tablet Take 1 tablet in evening prn foot pain. 30 tablet 0  . levothyroxine (SYNTHROID, LEVOTHROID) 50 MCG tablet Take one tablet by mouth one time daily 90 tablet 1  . lisinopril (PRINIVIL,ZESTRIL) 20 MG tablet Take 20 mg by mouth daily.     No current facility-administered medications on file prior to visit.     Review of Systems: No fever, chills, rash, nausea, vomiting, CP or SOB  Physical Examination: Vitals:   07/29/16 1423  BP: 137/89  Pulse: 93  Temp: 97.4 F (36.3 C)   Vitals:   07/29/16 1423  Weight: 160 lb 6.4 oz (72.8 kg)  Height: 5' 4.25" (1.632 m)   Body mass index is 27.32  kg/m. Ideal Body Weight: Weight in (lb) to have BMI = 25: 146.5  GEN: WDWN, NAD, Non-toxic, A & O x 3, mild overweight, looks well HEENT: Atraumatic, Normocephalic. Neck supple. No masses, No LAD.  Bilateral TM wnl, oropharynx normal.  PEERL,EOMI.   Nasal cavity is quite inflamed esp on the left side  Ears and Nose: No external deformity. CV: RRR, No M/G/R. No JVD. No thrill. No extra heart sounds. PULM: CTA B, no wheezes, crackles, rhonchi. No retractions. No resp. distress. No accessory muscle use. EXTR: No c/c/e NEURO Normal gait.  PSYCH: Normally interactive. Conversant. Not depressed or anxious appearing.  Calm demeanor.  Foot exam today- inspection normal but she has reduced sensation due to her neuropathy   Assessment and Plan: Chronic kidney disease, unspecified CKD stage - Plan: Comprehensive metabolic panel  Hypothyroidism due to acquired atrophy of thyroid - Plan: TSH  Diabetic mononeuropathy associated with type 2 diabetes mellitus ([place]) - Plan: Comprehensive metabolic panel, Hemoglobin A1c  Dyslipidemia - Plan: Lipid panel  Screening for deficiency anemia - Plan: CBC  Acute non-seasonal allergic rhinitis, unspecified trigger   Here today for a follow-up visit Labs pending as above to monitor her DM, lipids, thyroid She is seeing her nephrologist tomorrow- will get a CMP and plan to fax to her office when it comes in Recommended OTC flonase for her rhinitis Will plan further follow- up pending labs.   Signed Lamar Blinks, MD

## 2016-07-29 NOTE — Patient Instructions (Signed)
Please pick up some flonase OTC for your nose- I think this will help with your sneezing and other nasal symptoms I will be in touch with your labs asap Take care and let me know if the spray is not helpful

## 2016-07-29 NOTE — Progress Notes (Signed)
Pre visit review using our clinic tool,if applicable. No additional management support is needed unless otherwise documented below in the visit note.  

## 2016-07-30 DIAGNOSIS — N183 Chronic kidney disease, stage 3 (moderate): Secondary | ICD-10-CM | POA: Diagnosis not present

## 2016-07-30 DIAGNOSIS — I129 Hypertensive chronic kidney disease with stage 1 through stage 4 chronic kidney disease, or unspecified chronic kidney disease: Secondary | ICD-10-CM | POA: Diagnosis not present

## 2016-07-30 DIAGNOSIS — N2581 Secondary hyperparathyroidism of renal origin: Secondary | ICD-10-CM | POA: Diagnosis not present

## 2016-07-30 DIAGNOSIS — R809 Proteinuria, unspecified: Secondary | ICD-10-CM | POA: Diagnosis not present

## 2016-07-30 DIAGNOSIS — E1122 Type 2 diabetes mellitus with diabetic chronic kidney disease: Secondary | ICD-10-CM | POA: Diagnosis not present

## 2016-07-30 LAB — COMPREHENSIVE METABOLIC PANEL
ALK PHOS: 118 U/L — AB (ref 39–117)
ALT: 10 U/L (ref 0–35)
AST: 18 U/L (ref 0–37)
Albumin: 4.9 g/dL (ref 3.5–5.2)
BUN: 23 mg/dL (ref 6–23)
CO2: 26 mEq/L (ref 19–32)
CREATININE: 1.51 mg/dL — AB (ref 0.40–1.20)
Calcium: 10 mg/dL (ref 8.4–10.5)
Chloride: 103 mEq/L (ref 96–112)
GFR: 45.22 mL/min — AB (ref >60.00)
GLUCOSE: 125 mg/dL — AB (ref 70–99)
Potassium: 4.9 mEq/L (ref 3.5–5.1)
SODIUM: 139 meq/L (ref 135–145)
TOTAL PROTEIN: 8 g/dL (ref 6.0–8.3)
Total Bilirubin: 0.4 mg/dL (ref 0.2–1.2)

## 2016-07-30 LAB — TSH: TSH: 0.95 u[IU]/mL (ref 0.35–4.50)

## 2016-07-30 LAB — CBC
HCT: 38.4 % (ref 36.0–46.0)
HEMOGLOBIN: 12.8 g/dL (ref 12.0–15.0)
MCHC: 33.4 g/dL (ref 30.0–36.0)
MCV: 90.6 fl (ref 78.0–100.0)
Platelets: 309 10*3/uL (ref 150.0–400.0)
RBC: 4.24 Mil/uL (ref 3.87–5.11)
RDW: 13.7 % (ref 11.5–15.5)
WBC: 7.1 10*3/uL (ref 4.0–10.5)

## 2016-07-30 LAB — HEMOGLOBIN A1C: HEMOGLOBIN A1C: 6.4 % (ref 4.6–6.5)

## 2016-07-30 LAB — LIPID PANEL
Cholesterol: 280 mg/dL — ABNORMAL HIGH (ref 0–200)
HDL: 59.8 mg/dL (ref >39.00)
LDL Cholesterol: 193 mg/dL — ABNORMAL HIGH (ref 0–99)
NONHDL: 220.08
Total CHOL/HDL Ratio: 5
Triglycerides: 133 mg/dL (ref 0.0–149.0)
VLDL: 26.6 mg/dL (ref 0.0–40.0)

## 2016-07-31 ENCOUNTER — Encounter: Payer: Self-pay | Admitting: Family Medicine

## 2016-08-16 ENCOUNTER — Other Ambulatory Visit: Payer: Self-pay | Admitting: Family Medicine

## 2016-08-17 ENCOUNTER — Other Ambulatory Visit: Payer: Self-pay | Admitting: Emergency Medicine

## 2016-08-17 ENCOUNTER — Other Ambulatory Visit: Payer: Self-pay | Admitting: Family Medicine

## 2016-10-30 DIAGNOSIS — H524 Presbyopia: Secondary | ICD-10-CM | POA: Diagnosis not present

## 2016-12-01 DIAGNOSIS — I129 Hypertensive chronic kidney disease with stage 1 through stage 4 chronic kidney disease, or unspecified chronic kidney disease: Secondary | ICD-10-CM | POA: Diagnosis not present

## 2016-12-01 DIAGNOSIS — N183 Chronic kidney disease, stage 3 (moderate): Secondary | ICD-10-CM | POA: Diagnosis not present

## 2016-12-01 DIAGNOSIS — N2581 Secondary hyperparathyroidism of renal origin: Secondary | ICD-10-CM | POA: Diagnosis not present

## 2016-12-01 DIAGNOSIS — E1122 Type 2 diabetes mellitus with diabetic chronic kidney disease: Secondary | ICD-10-CM | POA: Diagnosis not present

## 2017-01-04 NOTE — Progress Notes (Addendum)
[place] at [facility] [address] [place], [place] [address] [phone]  Date:  01/06/2017   Name:  Caroline Simmons   DOB:  [birth year]   MRN:  [id number]  PCP:  Darreld Mclean, MD    Chief Complaint: Follow-up (Pt here for 4 month f/u visit. )   History of Present Illness:  Caroline Simmons is a 61 y.o. very pleasant female patient who presents with the following:  Last seen by myself in February of this year.  Needs a follow-up visit and labs today History of CKD, DM, dyslipidemia, neuropathy She continues to have complaint of persistent and severe peripheral neuropathy.  Her neurologist has moved away so she needs a new one/requsting a referral  She is taking amitriptyline, tegretol and hydrocodone- she uses the hydrocodone on occasion when her foot pain is too bad at night  Will check her A1c today Her glucose if often 116 fasting and 140 after she eats  Foot exam needed today, will take care of for her She had her eye exam this past March per her report  She is due for a tetanus booster so we will give this today   [place]: she got 90 hydrocodone on 08/17/16  Lab Results  Component Value Date   TSH 0.95 07/29/2016    Lab Results  Component Value Date   HGBA1C 6.4 07/29/2016     Chemistry      Component Value Date/Time   NA 139 07/29/2016 1509   K 4.9 07/29/2016 1509   CL 103 07/29/2016 1509   CO2 26 07/29/2016 1509   BUN 23 07/29/2016 1509   CREATININE 1.51 (H) 07/29/2016 1509   CREATININE 1.32 (H) 05/01/2015 1134      Component Value Date/Time   CALCIUM 10.0 07/29/2016 1509   ALKPHOS 118 (H) 07/29/2016 1509   AST 18 07/29/2016 1509   ALT 10 07/29/2016 1509   BILITOT 0.4 07/29/2016 1509       Patient Active Problem List   Diagnosis Date Noted  . Chronic kidney disease, stage 3 04/08/2016  . Dyslipidemia associated with type 2 diabetes mellitus ([place]) 05/30/2013  . Detached retina, left 11/25/2011   . Depression with anxiety 11/25/2011  . Neuropathy in diabetes ([place]) 09/15/2011  . Insomnia, unspecified   . Hypothyroidism   . DIABETES MELLITUS, TYPE II 08/26/2010  . HYPERTENSION 08/26/2010  . RECTAL PAIN 08/26/2010    Past Medical History:  Diagnosis Date  . Depression   . Essential hypertension, benign   . Hypothyroid   . Insomnia, unspecified   . Menopause    lmp 06/2007  . Type II or unspecified type diabetes mellitus without mention of complication, not stated as uncontrolled     Past Surgical History:  Procedure Laterality Date  . [place]  . EYE SURGERY    . RETINAL DETACHMENT REPAIR W/ SCLERAL BUCKLE LE  11/12/2011    Social History  Substance Use Topics  . Smoking status: Former Smoker    Packs/day: 1.00    Years: 3.00    Types: Cigarettes  . Smokeless tobacco: Never Used     Comment: quit 08/2003  . Alcohol use Yes     Comment: rare-beer or wine    Family History  Problem Relation Age of Onset  . Diabetes Mother        type 2  not sure onset  . Diabetes Unknown   .  Hypertension Unknown     Allergies  Allergen Reactions  . Codeine Nausea Only    Pt states she has taken this with no problems.  . Zoloft  [Sertraline Hcl]   . Lamotrigine Rash    Other reaction(s): RASH    Medication list has been reviewed and updated.  Current Outpatient Prescriptions on File Prior to Visit  Medication Sig Dispense Refill  . aspirin 81 MG tablet Take 81 mg by mouth daily.    . carbamazepine (TEGRETOL) 200 MG tablet Take 1 tablet (200 mg total) by mouth 3 (three) times daily. (Patient taking differently: Take 200 mg by mouth 2 (two) times daily. ) 270 tablet 3  . omeprazole (PRILOSEC) 20 MG capsule TAKE 2 CAPSULES ONCE A DAY FOR NAUSEA AND REFLUX. USE AS NEEDED- DOES NOT HAVE TO TAKE EVERY DAY 180 capsule 1   No current facility-administered medications on file prior to visit.     Review of Systems:  As per HPI- otherwise  negative. No fever, chills, CP or SOB No nausea or vomiting  Physical Examination: Vitals:   01/06/17 1337  BP: 110/80  Pulse: 82  Temp: (!) 97.5 F (36.4 C)   Vitals:   01/06/17 1337  Weight: 158 lb 6.4 oz (71.8 kg)  Height: 5\' 4"  (1.626 m)   Body mass index is 27.19 kg/m. Ideal Body Weight: Weight in (lb) to have BMI = 25: 145.3  GEN: WDWN, NAD, Non-toxic, A & O x 3, mild overweight, looks well HEENT: Atraumatic, Normocephalic. Neck supple. No masses, No LAD. Ears and Nose: No external deformity. CV: RRR, No M/G/R. No JVD. No thrill. No extra heart sounds. PULM: CTA B, no wheezes, crackles, rhonchi. No retractions. No resp. distress. No accessory muscle use. ABD: S, NT, ND. No rebound. No HSM. EXTR: No c/c/e NEURO Normal gait.  PSYCH: Normally interactive. Conversant. Not depressed or anxious appearing.  Calm demeanor.  Cannot feel monofilament in either foot on testing today    Assessment and Plan: Chronic kidney disease, unspecified CKD stage  Hypothyroidism due to acquired atrophy of thyroid - Plan: levothyroxine (SYNTHROID, LEVOTHROID) 50 MCG tablet, TSH  Controlled type 2 diabetes mellitus with diabetic polyneuropathy, without long-term current use of insulin ([facility]) - Plan: amitriptyline (ELAVIL) 100 MG tablet, HYDROcodone-acetaminophen (NORCO) 5-325 MG tablet, Basic metabolic panel, Hemoglobin A1c, Ambulatory referral to Neurology  Essential hypertension - Plan: lisinopril (PRINIVIL,ZESTRIL) 20 MG tablet  Immunization due - Plan: Tdap vaccine greater than or equal to 7yo IM  Hyperlipidemia associated with type 2 diabetes mellitus ([place]) - Plan: gemfibrozil (LOPID) 600 MG tablet  Follow-up on DM today Await A1c Referral to neurology for her neuropathy tdap today Refilled medications as above Will plan further follow- up pending labs.   Signed Lamar Blinks, MD  Received her labs  Results for orders placed or performed in visit on 31/51/76  Basic  metabolic panel  Result Value Ref Range   Sodium 138 135 - 145 mEq/L   Potassium 4.8 3.5 - 5.1 mEq/L   Chloride 104 96 - 112 mEq/L   CO2 23 19 - 32 mEq/L   Glucose, Bld 138 (H) 70 - 99 mg/dL   BUN 24 (H) 6 - 23 mg/dL   Creatinine, Ser 1.73 (H) 0.40 - 1.20 mg/dL   Calcium 10.1 8.4 - 10.5 mg/dL   GFR 38.59 (L) >60.00 mL/min  Hemoglobin A1c  Result Value Ref Range   Hgb A1c MFr Bld 6.4 4.6 - 6.5 %  TSH  Result  Value Ref Range   TSH 1.60 0.35 - 4.50 uIU/mL

## 2017-01-05 ENCOUNTER — Other Ambulatory Visit: Payer: Self-pay | Admitting: Family Medicine

## 2017-01-06 ENCOUNTER — Ambulatory Visit (INDEPENDENT_AMBULATORY_CARE_PROVIDER_SITE_OTHER): Payer: Medicare HMO | Admitting: Family Medicine

## 2017-01-06 VITALS — BP 110/80 | HR 82 | Temp 97.5°F | Ht 64.0 in | Wt 158.4 lb

## 2017-01-06 DIAGNOSIS — E1142 Type 2 diabetes mellitus with diabetic polyneuropathy: Secondary | ICD-10-CM

## 2017-01-06 DIAGNOSIS — E034 Atrophy of thyroid (acquired): Secondary | ICD-10-CM | POA: Diagnosis not present

## 2017-01-06 DIAGNOSIS — Z23 Encounter for immunization: Secondary | ICD-10-CM | POA: Diagnosis not present

## 2017-01-06 DIAGNOSIS — E785 Hyperlipidemia, unspecified: Secondary | ICD-10-CM | POA: Diagnosis not present

## 2017-01-06 DIAGNOSIS — I10 Essential (primary) hypertension: Secondary | ICD-10-CM

## 2017-01-06 DIAGNOSIS — N189 Chronic kidney disease, unspecified: Secondary | ICD-10-CM

## 2017-01-06 DIAGNOSIS — E1169 Type 2 diabetes mellitus with other specified complication: Secondary | ICD-10-CM | POA: Diagnosis not present

## 2017-01-06 MED ORDER — LEVOTHYROXINE SODIUM 50 MCG PO TABS: 50.0000 ug | ORAL_TABLET | Freq: Every day | ORAL | 3 refills | Status: DC

## 2017-01-06 MED ORDER — AMITRIPTYLINE HCL 100 MG PO TABS: 100.0000 mg | ORAL_TABLET | Freq: Every day | ORAL | 3 refills | Status: DC

## 2017-01-06 MED ORDER — LISINOPRIL 20 MG PO TABS: 20.0000 mg | ORAL_TABLET | Freq: Every day | ORAL | 3 refills | Status: DC

## 2017-01-06 MED ORDER — HYDROCODONE-ACETAMINOPHEN 5-325 MG PO TABS: ORAL_TABLET | ORAL | 0 refills | Status: DC

## 2017-01-06 MED ORDER — GEMFIBROZIL 600 MG PO TABS: 600.0000 mg | ORAL_TABLET | Freq: Two times a day (BID) | ORAL | 3 refills | Status: DC

## 2017-01-06 NOTE — Patient Instructions (Signed)
It was good to see you today!  I will be in touch with your labs asap and will get you referred to a neurologist

## 2017-01-07 LAB — BASIC METABOLIC PANEL
BUN: 24 mg/dL — ABNORMAL HIGH (ref 6–23)
CALCIUM: 10.1 mg/dL (ref 8.4–10.5)
CO2: 23 meq/L (ref 19–32)
CREATININE: 1.73 mg/dL — AB (ref 0.40–1.20)
Chloride: 104 mEq/L (ref 96–112)
GFR: 38.59 mL/min — AB (ref >60.00)
Glucose, Bld: 138 mg/dL — ABNORMAL HIGH (ref 70–99)
Potassium: 4.8 mEq/L (ref 3.5–5.1)
Sodium: 138 mEq/L (ref 135–145)

## 2017-01-07 LAB — HEMOGLOBIN A1C: HEMOGLOBIN A1C: 6.4 % (ref 4.6–6.5)

## 2017-01-07 LAB — TSH: TSH: 1.6 u[IU]/mL (ref 0.35–4.50)

## 2017-01-12 ENCOUNTER — Encounter: Payer: Self-pay | Admitting: Neurology

## 2017-03-19 ENCOUNTER — Telehealth: Payer: Self-pay | Admitting: Family Medicine

## 2017-03-19 NOTE — Telephone Encounter (Signed)
Called pt to schedule AWV. Lvm for pt to call office to schedule appt. Pt can schedule AWV after 05/27/2017. SF

## 2017-04-06 DIAGNOSIS — N183 Chronic kidney disease, stage 3 (moderate): Secondary | ICD-10-CM | POA: Diagnosis not present

## 2017-04-06 DIAGNOSIS — N2581 Secondary hyperparathyroidism of renal origin: Secondary | ICD-10-CM | POA: Diagnosis not present

## 2017-04-06 DIAGNOSIS — E1122 Type 2 diabetes mellitus with diabetic chronic kidney disease: Secondary | ICD-10-CM | POA: Diagnosis not present

## 2017-04-06 DIAGNOSIS — I129 Hypertensive chronic kidney disease with stage 1 through stage 4 chronic kidney disease, or unspecified chronic kidney disease: Secondary | ICD-10-CM | POA: Diagnosis not present

## 2017-04-19 ENCOUNTER — Encounter: Payer: Self-pay | Admitting: Neurology

## 2017-04-19 ENCOUNTER — Telehealth: Payer: Self-pay | Admitting: Family Medicine

## 2017-04-19 ENCOUNTER — Ambulatory Visit (INDEPENDENT_AMBULATORY_CARE_PROVIDER_SITE_OTHER): Payer: Medicare HMO | Admitting: Neurology

## 2017-04-19 VITALS — BP 148/90 | HR 99 | Ht 64.0 in | Wt 158.1 lb

## 2017-04-19 DIAGNOSIS — E1140 Type 2 diabetes mellitus with diabetic neuropathy, unspecified: Secondary | ICD-10-CM | POA: Diagnosis not present

## 2017-04-19 DIAGNOSIS — N183 Chronic kidney disease, stage 3 unspecified: Secondary | ICD-10-CM

## 2017-04-19 MED ORDER — AMITRIPTYLINE HCL 25 MG PO TABS: 25.0000 mg | ORAL_TABLET | Freq: Every day | ORAL | 5 refills | Status: DC

## 2017-04-19 NOTE — Patient Instructions (Addendum)
Start amitriptyline 25mg  in the morning and continue 100mg  at bedtime  Check EKG  Continue tegretol 400mg  in the morning and 200mg  at bedtime  Referral to Pain Management

## 2017-04-19 NOTE — Telephone Encounter (Signed)
Narda Amber at [facility] wants pt to have EKG but they do not do those in their office. Requesting Dr Lorelei Pont order EKG and set it up.

## 2017-04-19 NOTE — Progress Notes (Signed)
[facility] Clinic Note - Initial Visit   Date: 04/19/17  Caroline Simmons MRN: [id number] DOB: 1956-04-16   Dear Dr. Lorelei Pont:  Thank you for your kind referral of Caroline Simmons for consultation of neuropathy. Although her history is well known to you, please allow [place] to reiterate it for the purpose of our medical record. The patient was accompanied to the clinic by self.   History of Present Illness: Caroline Simmons is a 61 y.o. right-handed African American female with type 2 diabetes mellitus complicated by stage III CKD and neuropathy, hypertension, and hypothyroidism presenting for evaluation of painful diabetic neuropathy.    Starting around 2008, she started having numbness, burning, and tingling of the feet. Her pain is above the level of the ankles and constant.  She denies any imbalance and walks unassisted.  She has mild tingling in the left hand, which is intermittent.  She has noticed greater difficulty with opening jars and holding onto objects.   She is trying very hard to keep her diabetes controlled with diet, last HbA1c 6.4.   She was followed by Dr. Rema Jasmine at [facility]  Per  records, she had electrodiagnostic testing in March 2015 which did not show large fiber neuropathy.  She has previously tried Lyrica, gabapentin, cymbalta, lamictal, and dilantin.  She has the greatest benefit with amitriptyline 100 mg at bedtime and Tegretol 400 mg in the morning and 200 mg at bedtime.  She was on a higher dose of Tegretol in the past, however, due to her renal function and cognitive side effects, but the dose was reduced. For severe pain, she takes hydrocodone 1-2 times per week.  She did not wish to see pain management in the past, when it was recommended.    Out-side paper records, electronic medical record, and images have been reviewed where available and summarized as:  Lab Results  Component Value Date   HGBA1C 6.4 01/06/2017     Lab Results  Component Value Date   TSH 1.60 01/06/2017   Lab Results  Component Value Date   VITAMINB12 1515 (H) 08/26/2010    Past Medical History:  Diagnosis Date  . Depression   . Essential hypertension, benign   . Hypothyroid   . Insomnia, unspecified   . Menopause    lmp 06/2007  . Type II or unspecified type diabetes mellitus without mention of complication, not stated as uncontrolled     Past Surgical History:  Procedure Laterality Date  . [place]  . EYE SURGERY    . RETINAL DETACHMENT REPAIR W/ SCLERAL BUCKLE LE  11/12/2011     Medications:  Outpatient Encounter Prescriptions as of 04/19/2017  Medication Sig  . amitriptyline (ELAVIL) 100 MG tablet Take 1 tablet (100 mg total) by mouth at bedtime.  Marland Kitchen aspirin 81 MG tablet Take 81 mg by mouth daily.  . carbamazepine (TEGRETOL) 200 MG tablet Take 1 tablet (200 mg total) by mouth 3 (three) times daily. (Patient taking differently: Take 200 mg by mouth 2 (two) times daily. )  . gemfibrozil (LOPID) 600 MG tablet Take 1 tablet (600 mg total) by mouth 2 (two) times daily before a meal.  . HYDROcodone-acetaminophen (NORCO) 5-325 MG tablet Take 1 tablet in evening prn foot pain.  Marland Kitchen levothyroxine (SYNTHROID, LEVOTHROID) 50 MCG tablet Take 1 tablet (50 mcg total) by mouth daily.  Marland Kitchen lisinopril (PRINIVIL,ZESTRIL) 20 MG tablet Take 1 tablet (20 mg total) by mouth daily.  Marland Kitchen omeprazole (  PRILOSEC) 20 MG capsule TAKE 2 CAPSULES ONCE A DAY FOR NAUSEA AND REFLUX. USE AS NEEDED- DOES NOT HAVE TO TAKE EVERY DAY   No facility-administered encounter medications on file as of 04/19/2017.      Allergies:  Allergies  Allergen Reactions  . Codeine Nausea Only    Pt states she has taken this with no problems.  . Zoloft  [Sertraline Hcl]   . Lamotrigine Rash    Other reaction(s): RASH    Family History: Family History  Problem Relation Age of Onset  . Diabetes Mother        type 2  not sure onset  .  Diabetes Unknown   . Hypertension Unknown     Social History: Social History  Substance Use Topics  . Smoking status: Former Smoker    Packs/day: 1.00    Years: 3.00    Types: Cigarettes  . Smokeless tobacco: Never Used     Comment: quit 08/2003  . Alcohol use Yes     Comment: rare-beer or wine   Social History   Social History Narrative   Lives alone in a 2 story home.  Has 3 children.  On disability.  Did work for [employer] for 29 years.  Education: some college.     Review of Systems:  CONSTITUTIONAL: No fevers, chills, night sweats, or weight loss.   EYES: No visual changes or eye pain ENT: No hearing changes.  No history of nose bleeds.   RESPIRATORY: No cough, wheezing and shortness of breath.   CARDIOVASCULAR: Negative for chest pain, and palpitations.   GI: Negative for abdominal discomfort, blood in stools or black stools.  No recent change in bowel habits.   GU:  No history of incontinence.   MUSCLOSKELETAL: +history of joint pain or swelling.  No myalgias.   SKIN: Negative for lesions, rash, and itching.   HEMATOLOGY/ONCOLOGY: Negative for prolonged bleeding, bruising easily, and swollen nodes.  No history of cancer.   ENDOCRINE: Negative for cold or heat intolerance, polydipsia or goiter.   PSYCH:  No depression or anxiety symptoms.   NEURO: As Above.   Vital Signs:  BP (!) 148/90   Pulse 99   Ht 5\' 4"  (1.626 m)   Wt 158 lb 2 oz (71.7 kg)   SpO2 100%   BMI 27.14 kg/m   General Medical Exam:   General:  Well appearing, comfortable.   Eyes/ENT: see cranial nerve examination.   Neck: No masses appreciated.  Full range of motion without tenderness.  No carotid bruits. Respiratory:  Clear to auscultation, good air entry bilaterally.   Cardiac:  Regular rate and rhythm, no murmur.   Extremities:  No deformities, edema, or skin discoloration.  Skin:  No rashes or lesions.  Neurological Exam: MENTAL STATUS including orientation to time, place,  person, recent and remote memory, attention span and concentration, language, and fund of knowledge is normal.  Speech is not dysarthric.  CRANIAL NERVES: II:  No visual field defects.  Unremarkable fundi.   III-IV-VI: Pupils equal round and reactive to light.  Normal conjugate, extra-ocular eye movements in all directions of gaze.  No nystagmus.  No ptosis.   V:  Normal facial sensation.   VII:  Normal facial symmetry and movements.  VIII:  Normal hearing and vestibular function.   IX-X:  Normal palatal movement.   XI:  Normal shoulder shrug and head rotation.   XII:  Normal tongue strength and range of motion, no deviation or fasciculation.  MOTOR:  No atrophy, fasciculations or abnormal movements.  No pronator drift.  Tone is normal.    Right Upper Extremity:    Left Upper Extremity:    Deltoid  5/5   Deltoid  5/5   Biceps  5/5   Biceps  5/5   Triceps  5/5   Triceps  5/5   Wrist extensors  5/5   Wrist extensors  5/5   Wrist flexors  5/5   Wrist flexors  5/5   Finger extensors  5/5   Finger extensors  5/5   Finger flexors  5/5   Finger flexors  5/5   Dorsal interossei  4+/5   Dorsal interossei  4+/5   Abductor pollicis  5/5   Abductor pollicis  5/5   Tone (Ashworth scale)  0  Tone (Ashworth scale)  0   Right Lower Extremity:    Left Lower Extremity:    Hip flexors  5/5   Hip flexors  5/5   Hip extensors  5/5   Hip extensors  5/5   Knee flexors  5/5   Knee flexors  5/5   Knee extensors  5/5   Knee extensors  5/5   Dorsiflexors  5/5   Dorsiflexors  5/5   Plantarflexors  5/5   Plantarflexors  5/5   Toe extensors  5-/5   Toe extensors  5-/5   Toe flexors  5-/5   Toe flexors  5-/5   Tone (Ashworth scale)  0  Tone (Ashworth scale)  0   MSRs:  Right                                                                 Left brachioradialis 2+  brachioradialis 2+  biceps 2+  biceps 2+  triceps 2+  triceps 2+  patellar 2+  patellar 2+  ankle jerk 0  ankle jerk 0  Hoffman no  Hoffman no    plantar response down  plantar response down   SENSORY: Vibration is absent at the ankles bilaterally, temperature and pinprick is reduced in a gradient manner distal to the ankles.  Romberg sign is positive.    COORDINATION/GAIT: Normal finger-to- nose-finger.  Intact rapid alternating movements bilaterally.  Able to rise from a chair without using arms.  Gait mildly antalgic appearing and stable, unassisted.  She is able to perform stress gait, there is mild tenderness with tandem gait.      IMPRESSION: Distal and symmetric painful diabetic neuropathy, likely small fiber.  Previous electrodiagnostic testing did not show evidence of neuropathy.  History and exam is very consistent with neuropathy. She has previously tried Lyrica, gabapentin, cymbalta, lamictal, and dilantin.  She has the most benefit with amitriptyline 100 mg at bedtime and Tegretol 400 mg in the morning and 200 mg at bedtime.  Due to her renal function,  I do not recommend increasing her Tegretol.  We can cautiously increase her amitriptyline to 25 mg in the morning and continue 100 mg at bedtime.  I do recommend checking a EKG to evaluate her QTc.  She was informed  that my office does not prescribe narcotic medications with very few options left to treat her neuropathy, she would be better served by pain management clinic, which she is in agreement  with.  The duration of this appointment visit was 45 minutes of face-to-face time with the patient.  Greater than 50% of this time was spent in counseling, explanation of diagnosis, planning of further management, and coordination of care.   Thank you for allowing me to participate in patient's care.  If I can answer any additional questions, I would be pleased to do so.    Sincerely,    Maxi Rodas K. Posey Pronto, DO

## 2017-04-20 NOTE — Telephone Encounter (Signed)
Sure, that is fine.  Can you please schedule her for this?

## 2017-04-20 NOTE — Telephone Encounter (Signed)
Tried to contact pt, no answer. No voicemail, kept ringing until busy signal.

## 2017-04-22 ENCOUNTER — Other Ambulatory Visit: Payer: Self-pay | Admitting: *Deleted

## 2017-04-22 DIAGNOSIS — E1140 Type 2 diabetes mellitus with diabetic neuropathy, unspecified: Secondary | ICD-10-CM

## 2017-04-22 NOTE — Progress Notes (Unsigned)
amb  

## 2017-04-28 NOTE — Telephone Encounter (Signed)
Error. No pt note.

## 2017-05-09 NOTE — Progress Notes (Signed)
[facility] at [facility] [address] [place], [place] [address] [phone]  Date:  05/10/2017   Name:  Caroline Simmons   DOB:  1955-12-26   MRN:  [id number]  PCP:  Darreld Mclean, MD    Chief Complaint: Follow-up and Right side pain (going on for 1 week)   History of Present Illness:  Caroline Simmons is a 61 y.o. very pleasant female patient who presents with the following:  Follow-up visit today Last seen by myself in July:  History of CKD, DM, dyslipidemia, neuropathy She continues to have complaint of persistent and severe peripheral neuropathy.  Her neurologist has moved away so she needs a new one/requsting a referral  She is taking amitriptyline, tegretol and hydrocodone- she uses the hydrocodone on occasion when her foot pain is too bad at night  She is now seeing Dr. Posey Pronto (neurology) for her neuropathy Dr. Audie Clear with [facility] is her nephrologist: 1. CKD (chronic kidney disease), stage III [facility]) Renal Function Panel  2. Type 2 diabetes mellitus with stage 3 chronic kidney disease, without long-term current use of insulin ([place])  3. Essential hypertension  4. Secondary hyperparathyroidism of renal origin ([place]) Intact PTH   CKD stage III 2/2 DM , HTN with proteinuria - Renal Panel and vitamin D pending. - Repeat labs in 4 months if stable. - She has secondary HPTH - repeat PTH next visitl - BP and DM controlled.  Pt notes that she has suffered from some right side pain intermittently for a couple of weeks- she at first felt a bit flu like, now these other symptoms have resolved but she continued to have the pain in her side until yesterday.  However it is nor resolved.  She indicates the lateral and posterior ribs on the right, at the bra line, as the area of concern  She has used a heating pad She perhaps noted a bit of a rash over this area, and her right arm was itchy No fever  Give flu shot today She will  see DR. Sadiq in February to follow-up on her CRI Dr. Posey Pronto had her increase her amitriptyline dosage recently and recommended an EKG to check her QTc which we can do for her today   No CP or SOB Her rib pain is now resolved- she declines to have x-rays today but will let me know if it comes back  Her DM has been under good control.  [facility] TSH ok  Lab Results  Component Value Date   TSH 1.60 01/06/2017   Lab Results  Component Value Date   HGBA1C 6.4 01/06/2017   Needs foot exam   Patient Active Problem List   Diagnosis Date Noted  . Chronic kidney disease, stage 3 ([place]) 04/08/2016  . Dyslipidemia associated with type 2 diabetes mellitus ([place]) 05/30/2013  . Detached retina, left 11/25/2011  . Depression with anxiety 11/25/2011  . Neuropathy in diabetes ([place]) 09/15/2011  . Insomnia, unspecified   . Hypothyroidism   . DIABETES MELLITUS, TYPE II 08/26/2010  . HYPERTENSION 08/26/2010  . RECTAL PAIN 08/26/2010    Past Medical History:  Diagnosis Date  . Depression   . Essential hypertension, benign   . Hypothyroid   . Insomnia, unspecified   . Menopause    lmp 06/2007  . Type II or unspecified type diabetes mellitus without mention of complication, not stated as uncontrolled     Past Surgical History:  Procedure Laterality Date  .  [place]  . EYE SURGERY    . RETINAL DETACHMENT REPAIR W/ SCLERAL BUCKLE LE  11/12/2011    Social History   Tobacco Use  . Smoking status: Former Smoker    Packs/day: 1.00    Years: 3.00    Pack years: 3.00    Types: Cigarettes  . Smokeless tobacco: Never Used  . Tobacco comment: quit 08/2003  Substance Use Topics  . Alcohol use: Yes    Comment: rare-beer or wine  . Drug use: No    Family History  Problem Relation Age of Onset  . Diabetes Mother        type 2  not sure onset  . Diabetes Unknown   . Hypertension Unknown     Allergies  Allergen Reactions  . Codeine Nausea Only    Pt states she has  taken this with no problems.  . Zoloft  [Sertraline Hcl]   . Lamotrigine Rash    Other reaction(s): RASH    Medication list has been reviewed and updated.  Current Outpatient Medications on File Prior to Visit  Medication Sig Dispense Refill  . amitriptyline (ELAVIL) 100 MG tablet Take 1 tablet (100 mg total) by mouth at bedtime. 90 tablet 3  . amitriptyline (ELAVIL) 25 MG tablet Take 1 tablet (25 mg total) by mouth daily. 30 tablet 5  . aspirin 81 MG tablet Take 81 mg by mouth daily.    . carbamazepine (TEGRETOL) 200 MG tablet Take 1 tablet (200 mg total) by mouth 3 (three) times daily. (Patient taking differently: Take 200 mg by mouth 2 (two) times daily. ) 270 tablet 3  . gemfibrozil (LOPID) 600 MG tablet Take 1 tablet (600 mg total) by mouth 2 (two) times daily before a meal. 180 tablet 3  . HYDROcodone-acetaminophen (NORCO) 5-325 MG tablet Take 1 tablet in evening prn foot pain. 30 tablet 0  . levothyroxine (SYNTHROID, LEVOTHROID) 50 MCG tablet Take 1 tablet (50 mcg total) by mouth daily. 90 tablet 3  . lisinopril (PRINIVIL,ZESTRIL) 20 MG tablet Take 1 tablet (20 mg total) by mouth daily. 90 tablet 3  . omeprazole (PRILOSEC) 20 MG capsule TAKE 2 CAPSULES ONCE A DAY FOR NAUSEA AND REFLUX. USE AS NEEDED- DOES NOT HAVE TO TAKE EVERY DAY 180 capsule 1   No current facility-administered medications on file prior to visit.     Review of Systems:  As per HPI- otherwise negative. No current fever or chills No Cp or SOB   Physical Examination: Vitals:   05/10/17 1401  BP: (!) 130/93  Pulse: 86  Resp: 16  Temp: 97.7 F (36.5 C)  SpO2: 100%   Vitals:   05/10/17 1401  Weight: 158 lb 9.6 oz (71.9 kg)  Height: 5\' 4"  (1.626 m)   Body mass index is 27.22 kg/m. Ideal Body Weight: Weight in (lb) to have BMI = 25: 145.3  GEN: WDWN, NAD, Non-toxic, A & O x 3, overweight, looks well HEENT: Atraumatic, Normocephalic. Neck supple. No masses, No LAD.  Bilateral TM wnl, oropharynx  normal.  PEERL,EOMI.   Ears and Nose: No external deformity. CV: RRR, No M/G/R. No JVD. No thrill. No extra heart sounds. PULM: CTA B, no wheezes, crackles, rhonchi. No retractions. No resp. distress. No accessory muscle use. ABD: S, NT, ND, +BS. No rebound. No HSM. She indicates the right lateral and posterior ribs as the area of concern- no rash, swelling or redness on exam.  No reproducible tenderness at this  time  EXTR: No c/c/e NEURO Normal gait.  PSYCH: Normally interactive. Conversant. Not depressed or anxious appearing.  Calm demeanor.   EKG: SR- calculated QTC at 417 which is normal  She does have a non -sepcific T wave abnl, but no old tracings for comparison  Assesment and Plan: Needs flu shot - Plan: Flu Vaccine QUAD 6+ mos PF IM (Fluarix Quad PF)  Chronic kidney disease, unspecified CKD stage  Hypothyroidism due to acquired atrophy of thyroid  Controlled type 2 diabetes mellitus with diabetic polyneuropathy, without long-term current use of insulin ([facility]) - Plan: Hemoglobin A1c  Essential hypertension  Medication monitoring encounter - Plan: EKG 12-Lead  Right-sided chest wall pain checked Qtc for medication safety monitoring today- QTc is in normal range Flu shot today BP is under ok control Await A1c She declines further eval of her rib pain today but will let me know if this returns   Will plan further follow- up pending labs.    Signed Lamar Blinks, MD  Received A1c 11/20 Results for orders placed or performed in visit on 05/10/17  Hemoglobin A1c  Result Value Ref Range   Hgb A1c MFr Bld 6.7 (H) 4.6 - 6.5 %   Looks ok, letter to pt.  No meds indicated at this time- she can't use metformin due to her CKD

## 2017-05-10 ENCOUNTER — Ambulatory Visit: Payer: Medicare HMO | Admitting: Family Medicine

## 2017-05-10 ENCOUNTER — Encounter: Payer: Self-pay | Admitting: Family Medicine

## 2017-05-10 VITALS — BP 130/93 | HR 86 | Temp 97.7°F | Resp 16 | Ht 64.0 in | Wt 158.6 lb

## 2017-05-10 DIAGNOSIS — R0789 Other chest pain: Secondary | ICD-10-CM

## 2017-05-10 DIAGNOSIS — I10 Essential (primary) hypertension: Secondary | ICD-10-CM

## 2017-05-10 DIAGNOSIS — Z23 Encounter for immunization: Secondary | ICD-10-CM

## 2017-05-10 DIAGNOSIS — E034 Atrophy of thyroid (acquired): Secondary | ICD-10-CM

## 2017-05-10 DIAGNOSIS — N189 Chronic kidney disease, unspecified: Secondary | ICD-10-CM

## 2017-05-10 DIAGNOSIS — E1142 Type 2 diabetes mellitus with diabetic polyneuropathy: Secondary | ICD-10-CM

## 2017-05-10 DIAGNOSIS — Z5181 Encounter for therapeutic drug level monitoring: Secondary | ICD-10-CM | POA: Diagnosis not present

## 2017-05-10 NOTE — Patient Instructions (Signed)
It was good to see you today!   We did an EKG to check your "QTC" interval today- it is normal I will be in touch with your labs You got your flu shot today Please let me know if the pain in your right side returns

## 2017-05-11 LAB — HEMOGLOBIN A1C: HEMOGLOBIN A1C: 6.7 % — AB (ref 4.6–6.5)

## 2017-06-26 ENCOUNTER — Other Ambulatory Visit: Payer: Self-pay | Admitting: Neurology

## 2017-06-26 DIAGNOSIS — E1142 Type 2 diabetes mellitus with diabetic polyneuropathy: Secondary | ICD-10-CM

## 2017-06-28 ENCOUNTER — Other Ambulatory Visit: Payer: Self-pay | Admitting: *Deleted

## 2017-06-28 DIAGNOSIS — E1142 Type 2 diabetes mellitus with diabetic polyneuropathy: Secondary | ICD-10-CM

## 2017-06-28 MED ORDER — AMITRIPTYLINE HCL 100 MG PO TABS
100.0000 mg | ORAL_TABLET | Freq: Every day | ORAL | 3 refills | Status: DC
Start: 2017-06-28 — End: 2018-08-15

## 2017-07-19 ENCOUNTER — Other Ambulatory Visit: Payer: Self-pay | Admitting: Neurology

## 2017-07-19 NOTE — Telephone Encounter (Signed)
Rx called in 

## 2017-08-06 DIAGNOSIS — N183 Chronic kidney disease, stage 3 (moderate): Secondary | ICD-10-CM | POA: Diagnosis not present

## 2017-08-06 DIAGNOSIS — N2581 Secondary hyperparathyroidism of renal origin: Secondary | ICD-10-CM | POA: Diagnosis not present

## 2017-08-10 DIAGNOSIS — E559 Vitamin D deficiency, unspecified: Secondary | ICD-10-CM | POA: Diagnosis not present

## 2017-08-10 DIAGNOSIS — E1122 Type 2 diabetes mellitus with diabetic chronic kidney disease: Secondary | ICD-10-CM | POA: Diagnosis not present

## 2017-08-10 DIAGNOSIS — I129 Hypertensive chronic kidney disease with stage 1 through stage 4 chronic kidney disease, or unspecified chronic kidney disease: Secondary | ICD-10-CM | POA: Diagnosis not present

## 2017-08-10 DIAGNOSIS — E1140 Type 2 diabetes mellitus with diabetic neuropathy, unspecified: Secondary | ICD-10-CM | POA: Diagnosis not present

## 2017-08-10 DIAGNOSIS — R809 Proteinuria, unspecified: Secondary | ICD-10-CM | POA: Diagnosis not present

## 2017-08-10 DIAGNOSIS — N183 Chronic kidney disease, stage 3 (moderate): Secondary | ICD-10-CM | POA: Diagnosis not present

## 2017-08-10 DIAGNOSIS — N2581 Secondary hyperparathyroidism of renal origin: Secondary | ICD-10-CM | POA: Diagnosis not present

## 2017-09-15 ENCOUNTER — Other Ambulatory Visit: Payer: Self-pay | Admitting: *Deleted

## 2017-09-15 MED ORDER — AMITRIPTYLINE HCL 25 MG PO TABS: 25.0000 mg | ORAL_TABLET | Freq: Every day | ORAL | 2 refills | Status: DC

## 2017-10-13 ENCOUNTER — Ambulatory Visit (INDEPENDENT_AMBULATORY_CARE_PROVIDER_SITE_OTHER): Payer: Medicare HMO | Admitting: Family Medicine

## 2017-10-13 ENCOUNTER — Encounter: Payer: Self-pay | Admitting: Family Medicine

## 2017-10-13 VITALS — BP 122/84 | HR 74 | Resp 16 | Ht 64.0 in | Wt 160.8 lb

## 2017-10-13 DIAGNOSIS — Z1239 Encounter for other screening for malignant neoplasm of breast: Secondary | ICD-10-CM

## 2017-10-13 DIAGNOSIS — Z1231 Encounter for screening mammogram for malignant neoplasm of breast: Secondary | ICD-10-CM | POA: Diagnosis not present

## 2017-10-13 DIAGNOSIS — E785 Hyperlipidemia, unspecified: Secondary | ICD-10-CM | POA: Diagnosis not present

## 2017-10-13 DIAGNOSIS — G8929 Other chronic pain: Secondary | ICD-10-CM | POA: Diagnosis not present

## 2017-10-13 DIAGNOSIS — E1142 Type 2 diabetes mellitus with diabetic polyneuropathy: Secondary | ICD-10-CM

## 2017-10-13 DIAGNOSIS — M546 Pain in thoracic spine: Secondary | ICD-10-CM | POA: Diagnosis not present

## 2017-10-13 DIAGNOSIS — I10 Essential (primary) hypertension: Secondary | ICD-10-CM

## 2017-10-13 NOTE — Progress Notes (Addendum)
[facility] [address], [place], [place] [address] [phone]  Date:  10/13/2017   Name:  Caroline Simmons   DOB:  01-06-1956   MRN:  [id number]  PCP:  Darreld Mclean, MD    Chief Complaint: Diabetes (4 month, burning in feet, sleeping all the time)   History of Present Illness:  Caroline Simmons is a 62 y.o. very pleasant female patient who presents with the following:  Last seen here back in November Here today for a recheck visit History of CKD, DM, HTN DM is diet control only currently   She is now seeing Dr. Posey Pronto (neurology) for her neuropathy Dr. Audie Clear with [facility] is her nephrologist:  Lab Results  Component Value Date   HGBA1C 6.7 (H) 05/10/2017   Eye exam: will be due this month, reminded pt to have this service Needs A1c today   She notes that her feet are still burning  She takes amitriptyline and tegretol for her neuropathy- this is still a concern.  She has not seen Dr. Posey Pronto in 6 months and needs to see her for follow-up.  Asked her to please schedule this appt  She also has complaint of right sided back pain for a year or so- notes that she will have pain if she stands for more than a few minutes at a time.  She is not aware of any injury and does not have pain going down her legs   She is fasting today - will check her cholesterol today   Patient Active Problem List   Diagnosis Date Noted  . Chronic kidney disease, stage 3 ([place]) 04/08/2016  . Dyslipidemia associated with type 2 diabetes mellitus ([place]) 05/30/2013  . Detached retina, left 11/25/2011  . Depression with anxiety 11/25/2011  . Neuropathy in diabetes ([place]) 09/15/2011  . Insomnia, unspecified   . Hypothyroidism   . DIABETES MELLITUS, TYPE II 08/26/2010  . HYPERTENSION 08/26/2010  . RECTAL PAIN 08/26/2010    Past Medical History:  Diagnosis Date  . Depression   . Essential hypertension, benign   . Hypothyroid   .  Insomnia, unspecified   . Menopause    lmp 06/2007  . Type II or unspecified type diabetes mellitus without mention of complication, not stated as uncontrolled     Past Surgical History:  Procedure Laterality Date  . [place]  . EYE SURGERY    . RETINAL DETACHMENT REPAIR W/ SCLERAL BUCKLE LE  11/12/2011    Social History   Tobacco Use  . Smoking status: Former Smoker    Packs/day: 1.00    Years: 3.00    Pack years: 3.00    Types: Cigarettes  . Smokeless tobacco: Never Used  . Tobacco comment: quit 08/2003  Substance Use Topics  . Alcohol use: Yes    Comment: rare-beer or wine  . Drug use: No    Family History  Problem Relation Age of Onset  . Diabetes Mother        type 2  not sure onset  . Diabetes Unknown   . Hypertension Unknown     Allergies  Allergen Reactions  . Codeine Nausea Only    Pt states she has taken this with no problems.  . Zoloft  [Sertraline Hcl]   . Lamotrigine Rash    Other reaction(s): RASH    Medication list has been reviewed and updated.  Current Outpatient Medications on File  Prior to Visit  Medication Sig Dispense Refill  . amitriptyline (ELAVIL) 100 MG tablet Take 1 tablet (100 mg total) by mouth at bedtime. 90 tablet 3  . amitriptyline (ELAVIL) 25 MG tablet Take 1 tablet (25 mg total) by mouth daily. 90 tablet 2  . aspirin 81 MG tablet Take 81 mg by mouth daily.    . carbamazepine (TEGRETOL) 200 MG tablet TAKE 2 TABLETS BY MOUTH IN THE MORNING AND 1 TABLET BY MOUTH IN THE EVENING 270 tablet 0  . gemfibrozil (LOPID) 600 MG tablet Take 1 tablet (600 mg total) by mouth 2 (two) times daily before a meal. 180 tablet 3  . HYDROcodone-acetaminophen (NORCO) 5-325 MG tablet Take 1 tablet in evening prn foot pain. 30 tablet 0  . levothyroxine (SYNTHROID, LEVOTHROID) 50 MCG tablet Take 1 tablet (50 mcg total) by mouth daily. 90 tablet 3  . lisinopril (PRINIVIL,ZESTRIL) 20 MG tablet Take 1 tablet (20 mg total) by mouth  daily. 90 tablet 3  . omeprazole (PRILOSEC) 20 MG capsule TAKE 2 CAPSULES ONCE A DAY FOR NAUSEA AND REFLUX. USE AS NEEDED- DOES NOT HAVE TO TAKE EVERY DAY 180 capsule 1   No current facility-administered medications on file prior to visit.     Review of Systems:  As per HPI- otherwise negative. No fever or chills No CP or OB  Physical Examination: Vitals:   10/13/17 1505  BP: 122/84  Pulse: 74  Resp: 16  SpO2: 100%   Vitals:   10/13/17 1505  Weight: 160 lb 12.8 oz (72.9 kg)  Height: 5\' 4"  (1.626 m)   Body mass index is 27.6 kg/m. Ideal Body Weight: Weight in (lb) to have BMI = 25: 145.3  GEN: WDWN, NAD, Non-toxic, A & O x 3, looks well, mild overweight HEENT: Atraumatic, Normocephalic. Neck supple. No masses, No LAD.  Bilateral TM wnl, oropharynx normal.  PEERL,EOMI.   Ears and Nose: No external deformity. CV: RRR, No M/G/R. No JVD. No thrill. No extra heart sounds. PULM: CTA B, no wheezes, crackles, rhonchi. No retractions. No resp. distress. No accessory muscle use. EXTR: No c/c/e NEURO Normal gait.  PSYCH: Normally interactive. Conversant. Not depressed or anxious appearing.  Calm demeanor.  Feet show normal circulation and no lesions or skin breakdown She notes mild tenderness in the right sided paraspinal muscles in the thoracic and lumbar areas  No bony tenderness    Assessment and Plan: Controlled type 2 diabetes mellitus with diabetic polyneuropathy, without long-term current use of insulin (HCC) - Plan: Hemoglobin A1c, Comprehensive metabolic panel  Screening for breast cancer - Plan: MM DIAG BREAST TOMO BILATERAL  Essential hypertension  Dyslipidemia - Plan: Lipid panel  Chronic right-sided thoracic back pain - Plan: DG Thoracic Spine 2 View, DG Lumbar Spine Complete  Ordered plain films to follow-up on her back pain today Obtain A1c and CMP Ordered mammogram BP ok on current regimen Will plan further follow- up pending labs.  Signed Lamar Blinks, MD  Received her labs 4/28, she did not end up getting her films done.   Cannot use metformin due to borderline renal function May want to try a statin for her to see if her lipids will respond better  Called her 4/29 to go over her labs  She is ok with changing from lopid to a statin which I will do for her.  Letter to patient with her other results  rx for crestor 20 written today    Results for orders placed or  performed in visit on 10/13/17  Hemoglobin A1c  Result Value Ref Range   Hgb A1c MFr Bld 6.4 4.6 - 6.5 %  Comprehensive metabolic panel  Result Value Ref Range   Sodium 137 135 - 145 mEq/L   Potassium 4.7 3.5 - 5.1 mEq/L   Chloride 103 96 - 112 mEq/L   CO2 25 19 - 32 mEq/L   Glucose, Bld 111 (H) 70 - 99 mg/dL   BUN 24 (H) 6 - 23 mg/dL   Creatinine, Ser 1.52 (H) 0.40 - 1.20 mg/dL   Total Bilirubin 0.2 0.2 - 1.2 mg/dL   Alkaline Phosphatase 73 39 - 117 U/L   AST 14 0 - 37 U/L   ALT 10 0 - 35 U/L   Total Protein 7.4 6.0 - 8.3 g/dL   Albumin 4.5 3.5 - 5.2 g/dL   Calcium 9.6 8.4 - 10.5 mg/dL   GFR 44.70 (L) >60.00 mL/min  Lipid panel  Result Value Ref Range   Cholesterol 287 (H) 0 - 200 mg/dL   Triglycerides 214.0 (H) 0.0 - 149.0 mg/dL   HDL 50.90 >39.00 mg/dL   VLDL 42.8 (H) 0.0 - 40.0 mg/dL   Total CHOL/HDL Ratio 6    NonHDL 236.02   LDL cholesterol, direct  Result Value Ref Range   Direct LDL 161.0 mg/dL

## 2017-10-13 NOTE — Patient Instructions (Signed)
Please go to lab to have your blood drawn . Then you can go to the ground floor to have x-rays of your back Please follow-up with Dr. Posey Pronto about your foot symptoms

## 2017-10-14 ENCOUNTER — Other Ambulatory Visit: Payer: Self-pay | Admitting: Family Medicine

## 2017-10-14 LAB — COMPREHENSIVE METABOLIC PANEL
ALT: 10 U/L (ref 0–35)
AST: 14 U/L (ref 0–37)
Albumin: 4.5 g/dL (ref 3.5–5.2)
Alkaline Phosphatase: 73 U/L (ref 39–117)
BUN: 24 mg/dL — ABNORMAL HIGH (ref 6–23)
CALCIUM: 9.6 mg/dL (ref 8.4–10.5)
CO2: 25 meq/L (ref 19–32)
Chloride: 103 mEq/L (ref 96–112)
Creatinine, Ser: 1.52 mg/dL — ABNORMAL HIGH (ref 0.40–1.20)
GFR: 44.7 mL/min — AB (ref >60.00)
GLUCOSE: 111 mg/dL — AB (ref 70–99)
POTASSIUM: 4.7 meq/L (ref 3.5–5.1)
Sodium: 137 mEq/L (ref 135–145)
Total Bilirubin: 0.2 mg/dL (ref 0.2–1.2)
Total Protein: 7.4 g/dL (ref 6.0–8.3)

## 2017-10-14 LAB — HEMOGLOBIN A1C: HEMOGLOBIN A1C: 6.4 % (ref 4.6–6.5)

## 2017-10-14 LAB — LIPID PANEL
CHOL/HDL RATIO: 6
Cholesterol: 287 mg/dL — ABNORMAL HIGH (ref 0–200)
HDL: 50.9 mg/dL (ref >39.00)
NONHDL: 236.02
TRIGLYCERIDES: 214 mg/dL — AB (ref 0.0–149.0)
VLDL: 42.8 mg/dL — AB (ref 0.0–40.0)

## 2017-10-14 LAB — LDL CHOLESTEROL, DIRECT: LDL DIRECT: 161 mg/dL

## 2017-10-18 MED ORDER — ROSUVASTATIN CALCIUM 20 MG PO TABS: 20.0000 mg | ORAL_TABLET | Freq: Every day | ORAL | 11 refills | Status: DC

## 2017-10-18 NOTE — Addendum Note (Signed)
Addended by: Lamar Blinks C on: 10/18/2017 08:42 AM   Modules accepted: Orders

## 2017-11-04 ENCOUNTER — Telehealth: Payer: Self-pay

## 2017-11-04 NOTE — Telephone Encounter (Signed)
FYI

## 2017-11-04 NOTE — Telephone Encounter (Signed)
Copied from [place] [id number]. Topic: General - Other >> Nov 04, 2017 12:32 PM Lennox [place] wrote: Reason for CRM:  pt is calling to let dr copland know dr patel is referring her to pain management for neuropathy in her feet >> Nov 04, 2017 12:35 PM Lennox [place]ers wrote: Dr Posey Pronto the neurologist

## 2017-11-08 ENCOUNTER — Other Ambulatory Visit: Payer: Self-pay | Admitting: Neurology

## 2017-11-11 DIAGNOSIS — H5213 Myopia, bilateral: Secondary | ICD-10-CM | POA: Diagnosis not present

## 2017-11-15 ENCOUNTER — Encounter: Payer: Self-pay | Admitting: Family Medicine

## 2017-11-29 ENCOUNTER — Telehealth: Payer: Self-pay | Admitting: Neurology

## 2017-12-14 ENCOUNTER — Telehealth: Payer: Self-pay | Admitting: *Deleted

## 2017-12-14 NOTE — Telephone Encounter (Signed)
OK to refer to pain management.

## 2017-12-14 NOTE — Telephone Encounter (Signed)
Patient would like referral to pain management.  Ok to refer?

## 2017-12-15 NOTE — Telephone Encounter (Signed)
Referral sent to [facility].

## 2018-01-03 DIAGNOSIS — N183 Chronic kidney disease, stage 3 (moderate): Secondary | ICD-10-CM | POA: Diagnosis not present

## 2018-01-05 DIAGNOSIS — N2581 Secondary hyperparathyroidism of renal origin: Secondary | ICD-10-CM | POA: Diagnosis not present

## 2018-01-05 DIAGNOSIS — N183 Chronic kidney disease, stage 3 (moderate): Secondary | ICD-10-CM | POA: Diagnosis not present

## 2018-01-05 DIAGNOSIS — E1122 Type 2 diabetes mellitus with diabetic chronic kidney disease: Secondary | ICD-10-CM | POA: Diagnosis not present

## 2018-01-05 DIAGNOSIS — I129 Hypertensive chronic kidney disease with stage 1 through stage 4 chronic kidney disease, or unspecified chronic kidney disease: Secondary | ICD-10-CM | POA: Diagnosis not present

## 2018-01-05 DIAGNOSIS — E559 Vitamin D deficiency, unspecified: Secondary | ICD-10-CM | POA: Diagnosis not present

## 2018-02-13 NOTE — Progress Notes (Signed)
[facility] [address] [place], [place] [address] [phone]  Date:  02/16/2018   Name:  Caroline Simmons   DOB:  [birth year]   MRN:  [id number]  PCP:  Darreld Mclean, MD    Chief Complaint: Annual Exam (lab results need to be sent to nephrologist)   History of Present Illness:  Caroline Simmons is a 62 y.o. very pleasant female patient who presents with the following:  Following up today for a CPE History of CKD, hyperlipidemia, DM, hypothyroidism, HTN From our last visit in April: DM is diet control only currently  She is now seeing Dr. Posey Pronto (neurology) for her neuropathy Dr. Audie Clear with [facility] is her nephrologist:  RecentLabs       Lab Results  Component Value Date   HGBA1C 6.7 (H) 05/10/2017     Eye exam: will be due this month, reminded pt to have this service She notes that her feet are still burning  She takes amitriptyline and tegretol for her neuropathy- this is still a concern.  She has not seen Dr. Posey Pronto in 6 months and needs to see her for follow-up.  Asked her to please schedule this appt  She also has complaint of right sided back pain for a year or so- notes that she will have pain if she stands for more than a few minutes at a time.  She is not aware of any injury and does not have pain going down her legs //////////////////////////////// Ordered plain films to follow-up on her back pain today Obtain A1c and CMP Ordered mammogram BP ok on current regimen Will plan further follow- up pending labs. Received her labs 4/28, she did not end up getting her films done.   Cannot use metformin due to borderline renal function May want to try a statin for her to see if her lipids will respond better  Called her 4/29 to go over her labs  She is ok with changing from lopid to a statin which I will do for her.  Letter to patient with her other results  rx for crestor 20 written today    Eye exam;  UTD Shingrix: discuss with pt, she will call her insurance company to ask about cost first  Flu: can do today Mammogram- 18 months ago  Pap; it has been 3 years, she will schedule with your GYN.  Offered to do for her today but she prefers to wait  Lab Results  Component Value Date   HGBA1C 6.7 (H) 02/16/2018   Dr Posey Pronto referred her to [facility] for pain management since our last visit together as well She is tolerating her crestor well  She is seeing Dr. Audie Clear in October  Wt Readings from Last 3 Encounters:  02/16/18 165 lb 12.8 oz (75.2 kg)  10/13/17 160 lb 12.8 oz (72.9 kg)  05/10/17 158 lb 9.6 oz (71.9 kg)   She has been under some stress, but does not feel like she has changed her diet that much  Accompanied by her daughter here today  Patient Active Problem List   Diagnosis Date Noted  . Chronic kidney disease, stage 3 ([place]) 04/08/2016  . Dyslipidemia associated with type 2 diabetes mellitus ([place]) 05/30/2013  . Detached retina, left 11/25/2011  . Depression with anxiety 11/25/2011  . Neuropathy in diabetes ([place]) 09/15/2011  . Insomnia, unspecified   . Hypothyroidism   . DIABETES MELLITUS, TYPE II 08/26/2010  .  HYPERTENSION 08/26/2010  . RECTAL PAIN 08/26/2010    Past Medical History:  Diagnosis Date  . Depression   . Essential hypertension, benign   . Hypothyroid   . Insomnia, unspecified   . Menopause    lmp 06/2007  . Type II or unspecified type diabetes mellitus without mention of complication, not stated as uncontrolled     Past Surgical History:  Procedure Laterality Date  . [place]  . EYE SURGERY    . RETINAL DETACHMENT REPAIR W/ SCLERAL BUCKLE LE  11/12/2011    Social History   Tobacco Use  . Smoking status: Former Smoker    Packs/day: 1.00    Years: 3.00    Pack years: 3.00    Types: Cigarettes  . Smokeless tobacco: Never Used  . Tobacco comment: quit 08/2003  Substance Use Topics  . Alcohol use: Yes     Comment: rare-beer or wine  . Drug use: No    Family History  Problem Relation Age of Onset  . Diabetes Mother        type 2  not sure onset  . Diabetes Unknown   . Hypertension Unknown     Allergies  Allergen Reactions  . Codeine Nausea Only    Pt states she has taken this with no problems.  . Zoloft  [Sertraline Hcl]   . Lamotrigine Rash    Other reaction(s): RASH    Medication list has been reviewed and updated.  Current Outpatient Medications on File Prior to Visit  Medication Sig Dispense Refill  . ACCU-CHEK AVIVA PLUS test strip USE  1  TEST  STRIP EVERY DAY 100 each 3  . Alcohol Swabs (B-D SINGLE USE SWABS REGULAR) PADS USE  1  SWAB EVERY DAY 100 each 3  . amitriptyline (ELAVIL) 100 MG tablet Take 1 tablet (100 mg total) by mouth at bedtime. 90 tablet 3  . amitriptyline (ELAVIL) 25 MG tablet Take 1 tablet (25 mg total) by mouth daily. 90 tablet 2  . aspirin 81 MG tablet Take 81 mg by mouth daily.    . carbamazepine (TEGRETOL) 200 MG tablet TAKE 2 TABLETS BY MOUTH EVERY MORNING AND 1 TABLET IN THE EVENING 270 tablet 1  . HYDROcodone-acetaminophen (NORCO) 5-325 MG tablet Take 1 tablet in evening prn foot pain. 30 tablet 0  . levothyroxine (SYNTHROID, LEVOTHROID) 50 MCG tablet Take 1 tablet (50 mcg total) by mouth daily. 90 tablet 3  . lisinopril (PRINIVIL,ZESTRIL) 20 MG tablet Take 1 tablet (20 mg total) by mouth daily. 90 tablet 3  . omeprazole (PRILOSEC) 20 MG capsule TAKE 2 CAPSULES ONCE A DAY FOR NAUSEA AND REFLUX. USE AS NEEDED- DOES NOT HAVE TO TAKE EVERY DAY 180 capsule 1  . rosuvastatin (CRESTOR) 20 MG tablet Take 1 tablet (20 mg total) by mouth daily. 30 tablet 11   No current facility-administered medications on file prior to visit.     Review of Systems:  As per HPI- otherwise negative. No fever or chills No CP or SOB   Physical Examination: Vitals:   02/16/18 1313  BP: 136/84  Pulse: 74  Resp: 16  SpO2: 98%   Vitals:   02/16/18 1313  Weight:  165 lb 12.8 oz (75.2 kg)  Height: 5\' 4"  (1.626 m)   Body mass index is 28.46 kg/m. Ideal Body Weight: Weight in (lb) to have BMI = 25: 145.3  GEN: WDWN, NAD, Non-toxic, A & O x 3, overweight, looks well  HEENT:  Atraumatic, Normocephalic. Neck supple. No masses, No LAD.  Bilateral TM wnl, oropharynx normal.  PEERL,EOMI.   Ears and Nose: No external deformity. CV: RRR, No M/G/R. No JVD. No thrill. No extra heart sounds. PULM: CTA B, no wheezes, crackles, rhonchi. No retractions. No resp. distress. No accessory muscle use. ABD: S, NT, ND, +BS. No rebound. No HSM.  Benign belly EXTR: No c/c/e NEURO Normal gait.  PSYCH: Normally interactive. Conversant. Not depressed or anxious appearing.  Calm demeanor.    Assessment and Plan: Physical exam  Controlled type 2 diabetes mellitus with diabetic polyneuropathy, without long-term current use of insulin ([place]) - Plan: Hemoglobin A1c  Chronic kidney disease, unspecified CKD stage  Hypothyroidism due to acquired atrophy of thyroid - Plan: TSH  Essential hypertension - Plan: Comprehensive metabolic panel, CBC  Dyslipidemia - Plan: Lipid panel  Needs flu shot - Plan: Flu Vaccine QUAD 36+ mos IM (Fluarix & Fluzone Quad PF  Doing well today, no particular concerns  flu shot given Labs pending as above She needs her labs sent to nephrology which we can do certainly BP under good control Encouraged her to work on losing the few lbs she has gained She will get her mammo set up soon and schedule to see her GYN  Signed Lamar Blinks, MD  Received labs Results for orders placed or performed in visit on 02/16/18  Comprehensive metabolic panel  Result Value Ref Range   Sodium 140 135 - 145 mEq/L   Potassium 4.3 3.5 - 5.1 mEq/L   Chloride 105 96 - 112 mEq/L   CO2 26 19 - 32 mEq/L   Glucose, Bld 136 (H) 70 - 99 mg/dL   BUN 15 6 - 23 mg/dL   Creatinine, Ser 1.59 (H) 0.40 - 1.20 mg/dL   Total Bilirubin 0.3 0.2 - 1.2 mg/dL   Alkaline  Phosphatase 76 39 - 117 U/L   AST 23 0 - 37 U/L   ALT 22 0 - 35 U/L   Total Protein 7.0 6.0 - 8.3 g/dL   Albumin 4.5 3.5 - 5.2 g/dL   Calcium 9.1 8.4 - 10.5 mg/dL   GFR 42.39 (L) >60.00 mL/min  CBC  Result Value Ref Range   WBC 7.1 4.0 - 10.5 K/uL   RBC 3.92 3.87 - 5.11 Mil/uL   Platelets 201.0 150.0 - 400.0 K/uL   Hemoglobin 11.9 (L) 12.0 - 15.0 g/dL   HCT 35.1 (L) 36.0 - 46.0 %   MCV 89.5 78.0 - 100.0 fl   MCHC 33.8 30.0 - 36.0 g/dL   RDW 14.0 11.5 - 15.5 %  Hemoglobin A1c  Result Value Ref Range   Hgb A1c MFr Bld 6.7 (H) 4.6 - 6.5 %  Lipid panel  Result Value Ref Range   Cholesterol 173 0 - 200 mg/dL   Triglycerides 228.0 (H) 0.0 - 149.0 mg/dL   HDL 55.20 >39.00 mg/dL   VLDL 45.6 (H) 0.0 - 40.0 mg/dL   Total CHOL/HDL Ratio 3    NonHDL 117.99   TSH  Result Value Ref Range   TSH 1.70 0.35 - 4.50 uIU/mL  LDL cholesterol, direct  Result Value Ref Range   Direct LDL 72.0 mg/dL   Renal function is stable Cholesterol much better A1c up a bit but not out of control Mild anemia is stable   Letter to pt:  Metabolic profile is stable, your kidney function is unchanged.  I will fax your results to Dr. Audie Clear Blood counts look ok- mild anemia is  likely due to your kidney disease Your A1c- average blood sugar- is up just a bit.  We will continue to monitor Cholesterol looks much better!  Continue your crestor Thyroid is normal. Overall good news.  Please see me in 4-6 months for our next visit

## 2018-02-16 ENCOUNTER — Ambulatory Visit (INDEPENDENT_AMBULATORY_CARE_PROVIDER_SITE_OTHER): Payer: Medicare HMO | Admitting: Family Medicine

## 2018-02-16 ENCOUNTER — Encounter: Payer: Self-pay | Admitting: Family Medicine

## 2018-02-16 VITALS — BP 136/84 | HR 74 | Resp 16 | Ht 64.0 in | Wt 165.8 lb

## 2018-02-16 DIAGNOSIS — Z23 Encounter for immunization: Secondary | ICD-10-CM | POA: Diagnosis not present

## 2018-02-16 DIAGNOSIS — I10 Essential (primary) hypertension: Secondary | ICD-10-CM

## 2018-02-16 DIAGNOSIS — E1142 Type 2 diabetes mellitus with diabetic polyneuropathy: Secondary | ICD-10-CM | POA: Diagnosis not present

## 2018-02-16 DIAGNOSIS — E785 Hyperlipidemia, unspecified: Secondary | ICD-10-CM

## 2018-02-16 DIAGNOSIS — E034 Atrophy of thyroid (acquired): Secondary | ICD-10-CM | POA: Diagnosis not present

## 2018-02-16 DIAGNOSIS — Z0000 Encounter for general adult medical examination without abnormal findings: Secondary | ICD-10-CM

## 2018-02-16 DIAGNOSIS — N189 Chronic kidney disease, unspecified: Secondary | ICD-10-CM | POA: Diagnosis not present

## 2018-02-16 LAB — TSH: TSH: 1.7 u[IU]/mL (ref 0.35–4.50)

## 2018-02-16 LAB — COMPREHENSIVE METABOLIC PANEL
ALBUMIN: 4.5 g/dL (ref 3.5–5.2)
ALT: 22 U/L (ref 0–35)
AST: 23 U/L (ref 0–37)
Alkaline Phosphatase: 76 U/L (ref 39–117)
BUN: 15 mg/dL (ref 6–23)
CHLORIDE: 105 meq/L (ref 96–112)
CO2: 26 mEq/L (ref 19–32)
Calcium: 9.1 mg/dL (ref 8.4–10.5)
Creatinine, Ser: 1.59 mg/dL — ABNORMAL HIGH (ref 0.40–1.20)
GFR: 42.39 mL/min — ABNORMAL LOW (ref >60.00)
Glucose, Bld: 136 mg/dL — ABNORMAL HIGH (ref 70–99)
Potassium: 4.3 mEq/L (ref 3.5–5.1)
SODIUM: 140 meq/L (ref 135–145)
Total Bilirubin: 0.3 mg/dL (ref 0.2–1.2)
Total Protein: 7 g/dL (ref 6.0–8.3)

## 2018-02-16 LAB — LIPID PANEL
CHOL/HDL RATIO: 3
Cholesterol: 173 mg/dL (ref 0–200)
HDL: 55.2 mg/dL (ref >39.00)
NONHDL: 117.99
Triglycerides: 228 mg/dL — ABNORMAL HIGH (ref 0.0–149.0)
VLDL: 45.6 mg/dL — AB (ref 0.0–40.0)

## 2018-02-16 LAB — CBC
HEMATOCRIT: 35.1 % — AB (ref 36.0–46.0)
Hemoglobin: 11.9 g/dL — ABNORMAL LOW (ref 12.0–15.0)
MCHC: 33.8 g/dL (ref 30.0–36.0)
MCV: 89.5 fl (ref 78.0–100.0)
PLATELETS: 201 10*3/uL (ref 150.0–400.0)
RBC: 3.92 Mil/uL (ref 3.87–5.11)
RDW: 14 % (ref 11.5–15.5)
WBC: 7.1 10*3/uL (ref 4.0–10.5)

## 2018-02-16 LAB — LDL CHOLESTEROL, DIRECT: LDL DIRECT: 72 mg/dL

## 2018-02-16 LAB — HEMOGLOBIN A1C: Hgb A1c MFr Bld: 6.7 % — ABNORMAL HIGH (ref 4.6–6.5)

## 2018-02-16 NOTE — Patient Instructions (Addendum)
Please ask your insurance company about the shingrix vaccine - if this is covered we are glad to give it to you at your convenience Be sure to get your mammogram in the next 6 months or so Pap is also due! Please schedule with your GYN doctor I will be in touch with your labs asap  Take care!  Always good to see you  Health Maintenance, Female Adopting a healthy lifestyle and getting preventive care can go a long way to promote health and wellness. Talk with your health care provider about what schedule of regular examinations is right for you. This is a good chance for you to check in with your provider about disease prevention and staying healthy. In between checkups, there are plenty of things you can do on your own. Experts have done a lot of research about which lifestyle changes and preventive measures are most likely to keep you healthy. Ask your health care provider for more information. Weight and diet Eat a healthy diet  Be sure to include plenty of vegetables, fruits, low-fat dairy products, and lean protein.  Do not eat a lot of foods high in solid fats, added sugars, or salt.  Get regular exercise. This is one of the most important things you can do for your health. ? Most adults should exercise for at least 150 minutes each week. The exercise should increase your heart rate and make you sweat (moderate-intensity exercise). ? Most adults should also do strengthening exercises at least twice a week. This is in addition to the moderate-intensity exercise.  Maintain a healthy weight  Body mass index (BMI) is a measurement that can be used to identify possible weight problems. It estimates body fat based on height and weight. Your health care provider can help determine your BMI and help you achieve or maintain a healthy weight.  For females 36 years of age and older: ? A BMI below 18.5 is considered underweight. ? A BMI of 18.5 to 24.9 is normal. ? A BMI of 25 to 29.9 is  considered overweight. ? A BMI of 30 and above is considered obese.  Watch levels of cholesterol and blood lipids  You should start having your blood tested for lipids and cholesterol at 61 years of age, then have this test every 5 years.  You may need to have your cholesterol levels checked more often if: ? Your lipid or cholesterol levels are high. ? You are older than 61 years of age. ? You are at high risk for heart disease.  Cancer screening Lung Cancer  Lung cancer screening is recommended for adults 65-42 years old who are at high risk for lung cancer because of a history of smoking.  A yearly low-dose CT scan of the lungs is recommended for people who: ? Currently smoke. ? Have quit within the past 15 years. ? Have at least a 30-pack-year history of smoking. A pack year is smoking an average of one pack of cigarettes a day for 1 year.  Yearly screening should continue until it has been 15 years since you quit.  Yearly screening should stop if you develop a health problem that would prevent you from having lung cancer treatment.  Breast Cancer  Practice breast self-awareness. This means understanding how your breasts normally appear and feel.  It also means doing regular breast self-exams. Let your health care provider know about any changes, no matter how small.  If you are in your 20s or 30s, you should have  a clinical breast exam (CBE) by a health care provider every 1-3 years as part of a regular health exam.  If you are 63 or older, have a CBE every year. Also consider having a breast X-ray (mammogram) every year.  If you have a family history of breast cancer, talk to your health care provider about genetic screening.  If you are at high risk for breast cancer, talk to your health care provider about having an MRI and a mammogram every year.  Breast cancer gene (BRCA) assessment is recommended for women who have family members with BRCA-related cancers.  BRCA-related cancers include: ? Breast. ? Ovarian. ? Tubal. ? Peritoneal cancers.  Results of the assessment will determine the need for genetic counseling and BRCA1 and BRCA2 testing.  Cervical Cancer Your health care provider may recommend that you be screened regularly for cancer of the pelvic organs (ovaries, uterus, and vagina). This screening involves a pelvic examination, including checking for microscopic changes to the surface of your cervix (Pap test). You may be encouraged to have this screening done every 3 years, beginning at age 11.  For women ages 19-65, health care providers may recommend pelvic exams and Pap testing every 3 years, or they may recommend the Pap and pelvic exam, combined with testing for human papilloma virus (HPV), every 5 years. Some types of HPV increase your risk of cervical cancer. Testing for HPV may also be done on women of any age with unclear Pap test results.  Other health care providers may not recommend any screening for nonpregnant women who are considered low risk for pelvic cancer and who do not have symptoms. Ask your health care provider if a screening pelvic exam is right for you.  If you have had past treatment for cervical cancer or a condition that could lead to cancer, you need Pap tests and screening for cancer for at least 20 years after your treatment. If Pap tests have been discontinued, your risk factors (such as having a new sexual partner) need to be reassessed to determine if screening should resume. Some women have medical problems that increase the chance of getting cervical cancer. In these cases, your health care provider may recommend more frequent screening and Pap tests.  Colorectal Cancer  This type of cancer can be detected and often prevented.  Routine colorectal cancer screening usually begins at 61 years of age and continues through 61 years of age.  Your health care provider may recommend screening at an earlier age if  you have risk factors for colon cancer.  Your health care provider may also recommend using home test kits to check for hidden blood in the stool.  A small camera at the end of a tube can be used to examine your colon directly (sigmoidoscopy or colonoscopy). This is done to check for the earliest forms of colorectal cancer.  Routine screening usually begins at age 27.  Direct examination of the colon should be repeated every 5-10 years through 61 years of age. However, you may need to be screened more often if early forms of precancerous polyps or small growths are found.  Skin Cancer  Check your skin from head to toe regularly.  Tell your health care provider about any new moles or changes in moles, especially if there is a change in a mole's shape or color.  Also tell your health care provider if you have a mole that is larger than the size of a pencil eraser.  Always use  sunscreen. Apply sunscreen liberally and repeatedly throughout the day.  Protect yourself by wearing long sleeves, pants, a wide-brimmed hat, and sunglasses whenever you are outside.  Heart disease, diabetes, and high blood pressure  High blood pressure causes heart disease and increases the risk of stroke. High blood pressure is more likely to develop in: ? People who have blood pressure in the high end of the normal range (130-139/85-89 mm Hg). ? People who are overweight or obese. ? People who are African American.  If you are 68-26 years of age, have your blood pressure checked every 3-5 years. If you are 35 years of age or older, have your blood pressure checked every year. You should have your blood pressure measured twice-once when you are at a hospital or clinic, and once when you are not at a hospital or clinic. Record the average of the two measurements. To check your blood pressure when you are not at a hospital or clinic, you can use: ? An automated blood pressure machine at a pharmacy. ? A home blood  pressure monitor.  If you are between 60 years and 91 years old, ask your health care provider if you should take aspirin to prevent strokes.  Have regular diabetes screenings. This involves taking a blood sample to check your fasting blood sugar level. ? If you are at a normal weight and have a low risk for diabetes, have this test once every three years after 61 years of age. ? If you are overweight and have a high risk for diabetes, consider being tested at a younger age or more often. Preventing infection Hepatitis B  If you have a higher risk for hepatitis B, you should be screened for this virus. You are considered at high risk for hepatitis B if: ? You were born in a country where hepatitis B is common. Ask your health care provider which countries are considered high risk. ? Your parents were born in a high-risk country, and you have not been immunized against hepatitis B (hepatitis B vaccine). ? You have HIV or AIDS. ? You use needles to inject street drugs. ? You live with someone who has hepatitis B. ? You have had sex with someone who has hepatitis B. ? You get hemodialysis treatment. ? You take certain medicines for conditions, including cancer, organ transplantation, and autoimmune conditions.  Hepatitis C  Blood testing is recommended for: ? Everyone born from [birth year] through 1964. ? Anyone with known risk factors for hepatitis C.  Sexually transmitted infections (STIs)  You should be screened for sexually transmitted infections (STIs) including gonorrhea and chlamydia if: ? You are sexually active and are younger than 61 years of age. ? You are older than 61 years of age and your health care provider tells you that you are at risk for this type of infection. ? Your sexual activity has changed since you were last screened and you are at an increased risk for chlamydia or gonorrhea. Ask your health care provider if you are at risk.  If you do not have HIV, but are at risk,  it may be recommended that you take a prescription medicine daily to prevent HIV infection. This is called pre-exposure prophylaxis (PrEP). You are considered at risk if: ? You are sexually active and do not regularly use condoms or know the HIV status of your partner(s). ? You take drugs by injection. ? You are sexually active with a partner who has HIV.  Talk with your health  care provider about whether you are at high risk of being infected with HIV. If you choose to begin PrEP, you should first be tested for HIV. You should then be tested every 3 months for as long as you are taking PrEP. Pregnancy  If you are premenopausal and you may become pregnant, ask your health care provider about preconception counseling.  If you may become pregnant, take 400 to 800 micrograms (mcg) of folic acid every day.  If you want to prevent pregnancy, talk to your health care provider about birth control (contraception). Osteoporosis and menopause  Osteoporosis is a disease in which the bones lose minerals and strength with aging. This can result in serious bone fractures. Your risk for osteoporosis can be identified using a bone density scan.  If you are 65 years of age or older, or if you are at risk for osteoporosis and fractures, ask your health care provider if you should be screened.  Ask your health care provider whether you should take a calcium or vitamin D supplement to lower your risk for osteoporosis.  Menopause may have certain physical symptoms and risks.  Hormone replacement therapy may reduce some of these symptoms and risks. Talk to your health care provider about whether hormone replacement therapy is right for you. Follow these instructions at home:  Schedule regular health, dental, and eye exams.  Stay current with your immunizations.  Do not use any tobacco products including cigarettes, chewing tobacco, or electronic cigarettes.  If you are pregnant, do not drink  alcohol.  If you are breastfeeding, limit how much and how often you drink alcohol.  Limit alcohol intake to no more than 1 drink per day for nonpregnant women. One drink equals 12 ounces of beer, 5 ounces of wine, or 1 ounces of hard liquor.  Do not use street drugs.  Do not share needles.  Ask your health care provider for help if you need support or information about quitting drugs.  Tell your health care provider if you often feel depressed.  Tell your health care provider if you have ever been abused or do not feel safe at home. This information is not intended to replace advice given to you by your health care provider. Make sure you discuss any questions you have with your health care provider. Document Released: 12/22/2010 Document Revised: 11/14/2015 Document Reviewed: 03/12/2015 Elsevier Interactive Patient Education  [employer].

## 2018-02-17 ENCOUNTER — Telehealth: Payer: Self-pay | Admitting: Neurology

## 2018-02-17 NOTE — Telephone Encounter (Signed)
Office called and wanted to let you know that Dr. Orpah Melter was no longer accepting new patient, but still did injections? Please Call Patient. Thanks

## 2018-02-23 NOTE — Telephone Encounter (Signed)
Attempted to contact patient but the phone kept disconnecting.  Will try again later.

## 2018-02-24 ENCOUNTER — Telehealth: Payer: Self-pay | Admitting: Neurology

## 2018-02-24 NOTE — Telephone Encounter (Signed)
Patient wants to see Dr. Posey Pronto.  Informed her that someone would call her and also put her on the waiting list.

## 2018-02-24 NOTE — Telephone Encounter (Signed)
See next note

## 2018-02-24 NOTE — Telephone Encounter (Signed)
Patient called stated that she needed to speak with someone about her neuropathy problem. Please call her back at [phone]. Thanks.

## 2018-03-14 DIAGNOSIS — Z124 Encounter for screening for malignant neoplasm of cervix: Secondary | ICD-10-CM | POA: Diagnosis not present

## 2018-03-14 DIAGNOSIS — Z1231 Encounter for screening mammogram for malignant neoplasm of breast: Secondary | ICD-10-CM | POA: Diagnosis not present

## 2018-03-15 DIAGNOSIS — Z124 Encounter for screening for malignant neoplasm of cervix: Secondary | ICD-10-CM | POA: Diagnosis not present

## 2018-05-09 ENCOUNTER — Other Ambulatory Visit: Payer: Self-pay | Admitting: Neurology

## 2018-05-10 DIAGNOSIS — N183 Chronic kidney disease, stage 3 (moderate): Secondary | ICD-10-CM | POA: Diagnosis not present

## 2018-05-11 DIAGNOSIS — N183 Chronic kidney disease, stage 3 (moderate): Secondary | ICD-10-CM | POA: Diagnosis not present

## 2018-05-11 DIAGNOSIS — N2581 Secondary hyperparathyroidism of renal origin: Secondary | ICD-10-CM | POA: Diagnosis not present

## 2018-05-11 DIAGNOSIS — E559 Vitamin D deficiency, unspecified: Secondary | ICD-10-CM | POA: Diagnosis not present

## 2018-05-11 DIAGNOSIS — E1122 Type 2 diabetes mellitus with diabetic chronic kidney disease: Secondary | ICD-10-CM | POA: Diagnosis not present

## 2018-05-11 DIAGNOSIS — I129 Hypertensive chronic kidney disease with stage 1 through stage 4 chronic kidney disease, or unspecified chronic kidney disease: Secondary | ICD-10-CM | POA: Diagnosis not present

## 2018-05-11 DIAGNOSIS — R809 Proteinuria, unspecified: Secondary | ICD-10-CM | POA: Diagnosis not present

## 2018-05-11 DIAGNOSIS — E1140 Type 2 diabetes mellitus with diabetic neuropathy, unspecified: Secondary | ICD-10-CM | POA: Diagnosis not present

## 2018-05-11 DIAGNOSIS — Z87891 Personal history of nicotine dependence: Secondary | ICD-10-CM | POA: Diagnosis not present

## 2018-05-30 ENCOUNTER — Other Ambulatory Visit: Payer: Self-pay | Admitting: Neurology

## 2018-06-06 DIAGNOSIS — N183 Chronic kidney disease, stage 3 (moderate): Secondary | ICD-10-CM | POA: Diagnosis not present

## 2018-06-24 NOTE — Progress Notes (Signed)
Follow-up Visit   Date: 06/27/18    Caroline Simmons MRN: [id number] DOB: 1955/09/29   Interim History: Caroline Simmons is a 63 y.o. right-handed African American female with type 2 diabetes mellitus complicated by stage III CKD and neuropathy, hypertension, and hypothyroidism returning to the clinic for follow-up of painful diabetic neuropathy.  The patient was accompanied to the clinic by self.  History of present illness: Starting around 2008, she started having numbness, burning, and tingling of the feet. Her pain is above the level of the ankles and constant.  She denies any imbalance and walks unassisted.  She has mild tingling in the left hand, which is intermittent.  She has noticed greater difficulty with opening jars and holding onto objects.   She is trying very hard to keep her diabetes controlled with diet, last HbA1c 6.4.   She was followed by Dr. Rema Jasmine at [facility].  Per  records, she had electrodiagnostic testing in March 2015 which did not show large fiber neuropathy.  She has previously tried Lyrica, gabapentin, cymbalta, lamictal, and dilantin.  She has the greatest benefit with amitriptyline 100 mg at bedtime and Tegretol 400 mg in the morning and 200 mg at bedtime.  She was on a higher dose of Tegretol in the past, however, due to her renal function and cognitive side effects, but the dose was reduced. For severe pain, she takes hydrocodone 1-2 times per week.  She did not wish to see pain management in the past, when it was recommended.    UPDATE 06/24/2018:  At her last visit, I recommended that she establish care with pain management due to having no further recommendations given that she has already tried a number of nonnarcotic medications with no relief.  The referral which was placed unfortunately did not manage patients with neuropathy.  She continues to have severe burning pain of the feet and takes amitriptyline 25mg  in the morning and 100mg   at bedtime as well as Tegretol 400mg  in the morning and 200mg  at bedtime. She gets emotional and upset when she talks about her pain.  She is also complaining about left toe nail discoloration, stating that it has become brown.  She does not have toe nail discharge.   Medications:  Current Outpatient Medications on File Prior to Visit  Medication Sig Dispense Refill  . ACCU-CHEK AVIVA PLUS test strip USE  1  TEST  STRIP EVERY DAY 100 each 3  . Alcohol Swabs (B-D SINGLE USE SWABS REGULAR) PADS USE  1  SWAB EVERY DAY 100 each 3  . amitriptyline (ELAVIL) 100 MG tablet Take 1 tablet (100 mg total) by mouth at bedtime. 90 tablet 3  . amitriptyline (ELAVIL) 25 MG tablet TAKE 1 TABLET BY MOUTH EVERY DAY 30 tablet 0  . aspirin 81 MG tablet Take 81 mg by mouth daily.    . carbamazepine (TEGRETOL) 200 MG tablet TAKE 2 TABLETS BY MOUTH EVERY MORNING AND 1 TABLET IN THE EVENING 270 tablet 0  . levothyroxine (SYNTHROID, LEVOTHROID) 50 MCG tablet Take 1 tablet (50 mcg total) by mouth daily. 90 tablet 3  . lisinopril (PRINIVIL,ZESTRIL) 20 MG tablet Take 1 tablet (20 mg total) by mouth daily. 90 tablet 3  . omeprazole (PRILOSEC) 20 MG capsule TAKE 2 CAPSULES ONCE A DAY FOR NAUSEA AND REFLUX. USE AS NEEDED- DOES NOT HAVE TO TAKE EVERY DAY 180 capsule 1  . rosuvastatin (CRESTOR) 20 MG tablet Take 1 tablet (20 mg total) by mouth daily.  30 tablet 11  . HYDROcodone-acetaminophen (NORCO) 5-325 MG tablet Take 1 tablet in evening prn foot pain. (Patient not taking: Reported on 06/27/2018) 30 tablet 0   No current facility-administered medications on file prior to visit.     Allergies:  Allergies  Allergen Reactions  . Codeine Nausea Only    Pt states she has taken this with no problems.  . Zoloft  [Sertraline Hcl]   . Lamotrigine Rash    Other reaction(s): RASH    Review of Systems:  CONSTITUTIONAL: No fevers, chills, night sweats, or weight loss.  EYES: No visual changes or eye pain ENT: No hearing changes.   No history of nose bleeds.   RESPIRATORY: No cough, wheezing and shortness of breath.   CARDIOVASCULAR: Negative for chest pain, and palpitations.   GI: Negative for abdominal discomfort, blood in stools or black stools.  No recent change in bowel habits.   GU:  No history of incontinence.   MUSCLOSKELETAL: +history of joint pain or swelling.  No myalgias.   SKIN: Negative for lesions, rash, and itching.   ENDOCRINE: Negative for cold or heat intolerance, polydipsia or goiter.   PSYCH:  + depression or anxiety symptoms.   NEURO: As Above.   Vital Signs:  BP 140/90   Pulse 96   Ht 5' 3.75" (1.619 m)   Wt 171 lb 6 oz (77.7 kg)   SpO2 97%   BMI 29.65 kg/m    General Medical Exam:   General:  Well appearing, comfortable  Eyes/ENT: see cranial nerve examination.   Neck: No masses appreciated.  Full range of motion without tenderness.  No carotid bruits. Respiratory:  Clear to auscultation, good air entry bilaterally.   Cardiac:  Regular rate and rhythm, no murmur.   Ext:  Left great toenail onychomycosis, no edema  Neurological Exam: MENTAL STATUS including orientation to time, place, person, recent and remote memory, attention span and concentration, language, and fund of knowledge is normal.  Speech is not dysarthric.  CRANIAL NERVES:  Face is symmetric.   MOTOR:  Motor strength is 5/5 in all extremities, except 5-/5 distal hand and toe weakness.  No atrophy, fasciculations or abnormal movements.  No pronator drift.  Tone is normal.    MSRs:  Reflexes are 2+/4 throughout, except absent Achilles.  SENSORY:  Absent vibration distal to ankles bilaterally and reduced temperature distal to mid-calf bilaterally.   Positive Rhomberg testing  COORDINATION/GAIT:  Normal finger-to- nose-finger. Gait narrow based and stable.   Data: Lab Results  Component Value Date   TSH 1.70 02/16/2018   Lab Results  Component Value Date   HGBA1C 6.7 (H) 02/16/2018    IMPRESSION/PLAN: Distal  and symmetric painful diabetic neuropathy, most likely small fiber as prior electrodiagnostic testing did not show evidence of large fiber neuropathy.  She has tried a number of medications in the past including Lyrica, gabapentin, Cymbalta, Lamictal, and Dilantin.  She currently takes amitriptyline 25mg  in the morning and 100 mg at bedtime and Tegretol 400 mg in the morning and 200 mg at bedtime. I will get an updated EKG to evaluate QTc and determine if it is safe to titrate amitriptyline.  Renal function prohibits increasing Tegretol.  Unfortunately, I do not have many other pain options for her and will refer to pain management given her refractory pain.  For her onchyomyocosis, refer to podiatry for evaluation and management.   Thank you for allowing me to participate in patient's care.  If I can answer  any additional questions, I would be pleased to do so.    Sincerely,    Sonakshi Rolland K. Posey Pronto, DO

## 2018-06-27 ENCOUNTER — Encounter

## 2018-06-27 ENCOUNTER — Encounter: Payer: Self-pay | Admitting: Neurology

## 2018-06-27 ENCOUNTER — Ambulatory Visit: Payer: Medicare HMO | Admitting: Neurology

## 2018-06-27 VITALS — BP 140/90 | HR 96 | Ht 63.75 in | Wt 171.4 lb

## 2018-06-27 DIAGNOSIS — E1140 Type 2 diabetes mellitus with diabetic neuropathy, unspecified: Secondary | ICD-10-CM | POA: Diagnosis not present

## 2018-06-27 DIAGNOSIS — Z79899 Other long term (current) drug therapy: Secondary | ICD-10-CM

## 2018-06-27 DIAGNOSIS — N183 Chronic kidney disease, stage 3 unspecified: Secondary | ICD-10-CM

## 2018-06-27 DIAGNOSIS — E1122 Type 2 diabetes mellitus with diabetic chronic kidney disease: Secondary | ICD-10-CM | POA: Diagnosis not present

## 2018-06-27 NOTE — Patient Instructions (Signed)
We will get EKG and determine if your amitriptyline can be increased any further  We will also make a referral for Pain Management

## 2018-07-02 ENCOUNTER — Other Ambulatory Visit: Payer: Self-pay | Admitting: Neurology

## 2018-07-02 DIAGNOSIS — E1142 Type 2 diabetes mellitus with diabetic polyneuropathy: Secondary | ICD-10-CM

## 2018-08-10 ENCOUNTER — Telehealth: Payer: Self-pay

## 2018-08-10 NOTE — Telephone Encounter (Signed)
Did not call patient.

## 2018-08-10 NOTE — Telephone Encounter (Signed)
Copied from [place] #[id number]. Topic: General - Call Back - No Documentation >> Aug 09, 2018  2:29 PM Sheran Luz wrote: Reason for CRM: Patient returning call to office- no documentation.

## 2018-08-11 DIAGNOSIS — E1142 Type 2 diabetes mellitus with diabetic polyneuropathy: Secondary | ICD-10-CM | POA: Diagnosis not present

## 2018-08-11 DIAGNOSIS — M24571 Contracture, right ankle: Secondary | ICD-10-CM | POA: Diagnosis not present

## 2018-08-11 DIAGNOSIS — B351 Tinea unguium: Secondary | ICD-10-CM | POA: Diagnosis not present

## 2018-08-11 DIAGNOSIS — M205X2 Other deformities of toe(s) (acquired), left foot: Secondary | ICD-10-CM | POA: Diagnosis not present

## 2018-08-11 DIAGNOSIS — M24572 Contracture, left ankle: Secondary | ICD-10-CM | POA: Diagnosis not present

## 2018-08-11 NOTE — Telephone Encounter (Signed)
Pt called and stated that she thinks that it has to do with an AWV. Please advise [phone]

## 2018-08-14 ENCOUNTER — Other Ambulatory Visit: Payer: Self-pay | Admitting: Family Medicine

## 2018-08-14 ENCOUNTER — Other Ambulatory Visit: Payer: Self-pay | Admitting: Neurology

## 2018-08-14 DIAGNOSIS — E034 Atrophy of thyroid (acquired): Secondary | ICD-10-CM

## 2018-08-14 DIAGNOSIS — E1142 Type 2 diabetes mellitus with diabetic polyneuropathy: Secondary | ICD-10-CM

## 2018-08-15 NOTE — Telephone Encounter (Signed)
Need updated EKG, doesn't look like she went for it.

## 2018-08-19 ENCOUNTER — Ambulatory Visit (INDEPENDENT_AMBULATORY_CARE_PROVIDER_SITE_OTHER): Payer: Medicare HMO | Admitting: *Deleted

## 2018-08-19 ENCOUNTER — Encounter: Payer: Self-pay | Admitting: *Deleted

## 2018-08-19 ENCOUNTER — Encounter: Payer: Self-pay | Admitting: Family Medicine

## 2018-08-19 ENCOUNTER — Ambulatory Visit (INDEPENDENT_AMBULATORY_CARE_PROVIDER_SITE_OTHER): Payer: Medicare HMO | Admitting: Family Medicine

## 2018-08-19 VITALS — BP 138/88 | HR 87 | Ht 64.0 in | Wt 166.8 lb

## 2018-08-19 VITALS — BP 138/88 | HR 87 | Temp 97.7°F | Ht 64.0 in | Wt 166.0 lb

## 2018-08-19 DIAGNOSIS — Z0000 Encounter for general adult medical examination without abnormal findings: Secondary | ICD-10-CM

## 2018-08-19 DIAGNOSIS — L659 Nonscarring hair loss, unspecified: Secondary | ICD-10-CM | POA: Diagnosis not present

## 2018-08-19 DIAGNOSIS — I10 Essential (primary) hypertension: Secondary | ICD-10-CM | POA: Diagnosis not present

## 2018-08-19 MED ORDER — LISINOPRIL 20 MG PO TABS: 20.0000 mg | ORAL_TABLET | Freq: Every day | ORAL | 3 refills | Status: DC

## 2018-08-19 NOTE — Progress Notes (Signed)
Chief Complaint  Patient presents with  . Medication Problem    Subjective Caroline Simmons is a 63 y.o. female who presents for hypertension follow up. She does not monitor home blood pressures. She was recently changed from 20 mg ACEi to 40 mg, has been having dizziness and lightheadedness. Patient has these side effects of medication: none She is adhering to a healthy diet overall. Current exercise: cycling  Pt is losing hair. No new soaps/lotions/shampoos/hair products. Not pulling hair tight. No famhx. Has not tried anything at home. Does take thyroid medicine routinlely.    Past Medical History:  Diagnosis Date  . Depression   . Essential hypertension, benign   . Hypothyroid   . Insomnia, unspecified   . Menopause    lmp 06/2007  . Type II or unspecified type diabetes mellitus without mention of complication, not stated as uncontrolled     Review of Systems Cardiovascular: no chest pain Respiratory:  no shortness of breath  Exam BP 138/88 (BP Location: Left Arm, Patient Position: Sitting, Cuff Size: Normal)   Pulse 87   Temp 97.7 F (36.5 C) (Oral)   Ht 5\' 4"  (1.626 m)   Wt 166 lb (75.3 kg)   SpO2 96%   BMI 28.49 kg/m  General:  well developed, well nourished, in no apparent distress Skin: thinning of hair over top of head, no signs of infection, erythema, ttp, scaling.  Heart: RRR, no bruits, no LE edema Lungs: clear to auscultation, no accessory muscle use Psych: well oriented with normal range of affect and appropriate judgment/insight  Essential hypertension - Plan: lisinopril (PRINIVIL,ZESTRIL) 20 MG tablet  Hair loss - Plan: CBC, T4, free, TSH, Iron, TIBC and Ferritin Panel, CANCELED: CBC, CANCELED: IBC + Ferritin, CANCELED: T4, free, CANCELED: TSH  Go back to 20 mg/d of ACEi on Sunday.  Counseled on diet and exercise. Try to find a way to ck BP at home, get monitor.  Ck labs. Consider B10. F/u in 2 weeks w reg PCP to make sure BP is controlled and she is  not having AE's. The patient voiced understanding and agreement to the plan.  [place], DO 08/19/18  3:09 PM

## 2018-08-19 NOTE — Progress Notes (Signed)
Subjective:   Caroline Simmons is a 63 y.o. female who presents for Medicare Annual (Subsequent) preventive examination.  Review of Systems: No ROS.  Medicare Wellness Visit. Additional risk factors are reflected in the social history. Cardiac Risk Factors include: advanced age (>77men, >51 women);diabetes mellitus;dyslipidemia;hypertension   Sleep patterns:   Sleeps well with meds Home Safety/Smoke Alarms: Feels safe in home. Smoke alarms in place.  Lives with daughter in 2 story home. Step over tub.   Female:   Pap-  Pt reports done last year with Dr.Haney. normal     Mammo- active order. Dexa scan-  06/23/16      CCS- done 08/27/10- 10 yr recall  Eye- lens crafters yearly per pt.    Objective:     Vitals: BP 138/88 (BP Location: Left Arm, Patient Position: Sitting, Cuff Size: Normal)   Pulse 87   Ht 5\' 4"  (1.626 m)   Wt 166 lb 12.8 oz (75.7 kg)   SpO2 96%   BMI 28.63 kg/m   Body mass index is 28.63 kg/m.  Advanced Directives 08/19/2018 05/27/2016 03/13/2015  Does Patient Have a Medical Advance Directive? Yes Yes Yes  Type of Paramedic of [place];Living will Living will -  Does patient want to make changes to medical advance directive? No - Patient declined - -  Copy of [place] in Chart? No - copy requested - No - copy requested    Tobacco Social History   Tobacco Use  Smoking Status Former Smoker  . Packs/day: 1.00  . Years: 3.00  . Pack years: 3.00  . Types: Cigarettes  Smokeless Tobacco Never Used  Tobacco Comment   quit 08/2003     Counseling given: Not Answered Comment: quit 08/2003   Clinical Intake: Pain : No/denies pain    Past Medical History:  Diagnosis Date  . Depression   . Essential hypertension, benign   . Hypothyroid   . Insomnia, unspecified   . Menopause    lmp 06/2007  . Type II or unspecified type diabetes mellitus without mention of complication, not stated as uncontrolled    Past  Surgical History:  Procedure Laterality Date  . [place]  . EYE SURGERY    . RETINAL DETACHMENT REPAIR W/ SCLERAL BUCKLE LE  11/12/2011   Family History  Problem Relation Age of Onset  . Diabetes Mother        type 2  not sure onset  . Diabetes Other   . Hypertension Other    Social History   Socioeconomic History  . Marital status: Married    Spouse name: Not on file  . Number of children: 3  . Years of education: 81  . Highest education level: Not on file  Occupational History  . Occupation: disabled  Social Needs  . Financial resource strain: Not on file  . Food insecurity:    Worry: Not on file    Inability: Not on file  . Transportation needs:    Medical: Not on file    Non-medical: Not on file  Tobacco Use  . Smoking status: Former Smoker    Packs/day: 1.00    Years: 3.00    Pack years: 3.00    Types: Cigarettes  . Smokeless tobacco: Never Used  . Tobacco comment: quit 08/2003  Substance and Sexual Activity  . Alcohol use: Yes    Comment: rare-beer or wine  . Drug use: No  . Sexual activity: Not  Currently  Lifestyle  . Physical activity:    Days per week: Not on file    Minutes per session: Not on file  . Stress: Not on file  Relationships  . Social connections:    Talks on phone: Not on file    Gets together: Not on file    Attends religious service: Not on file    Active member of club or organization: Not on file    Attends meetings of clubs or organizations: Not on file    Relationship status: Not on file  Other Topics Concern  . Not on file  Social History Narrative   Lives alone in a 2 story home.  Has 3 children.  On disability.  Did work for [employer] for 29 years.  Education: some college.     Outpatient Encounter Medications as of 08/19/2018  Medication Sig  . ACCU-CHEK AVIVA PLUS test strip USE  1  TEST  STRIP EVERY DAY  . Alcohol Swabs (B-D SINGLE USE SWABS REGULAR) PADS USE  1  SWAB EVERY DAY  .  amitriptyline (ELAVIL) 100 MG tablet TAKE 1 TABLET BY MOUTH AT BEDTIME  . amitriptyline (ELAVIL) 25 MG tablet TAKE 1 TABLET BY MOUTH EVERY DAY  . aspirin 81 MG tablet Take 81 mg by mouth daily.  . carbamazepine (TEGRETOL) 200 MG tablet TAKE 2 TABLETS BY MOUTH EVERY MORNING AND 1 TABLET IN THE EVENING  . levothyroxine (SYNTHROID, LEVOTHROID) 50 MCG tablet TAKE ONE TABLET BY MOUTH ONE TIME DAILY  . rosuvastatin (CRESTOR) 20 MG tablet Take 1 tablet (20 mg total) by mouth daily.  Marland Kitchen HYDROcodone-acetaminophen (NORCO) 5-325 MG tablet Take 1 tablet in evening prn foot pain. (Patient not taking: Reported on 06/27/2018)  . lisinopril (PRINIVIL,ZESTRIL) 20 MG tablet Take 1 tablet (20 mg total) by mouth daily. (Patient not taking: Reported on 08/19/2018)  . lisinopril (PRINIVIL,ZESTRIL) 40 MG tablet   . omeprazole (PRILOSEC) 20 MG capsule TAKE 2 CAPSULES ONCE A DAY FOR NAUSEA AND REFLUX. USE AS NEEDED- DOES NOT HAVE TO TAKE EVERY DAY (Patient not taking: Reported on 08/19/2018)   No facility-administered encounter medications on file as of 08/19/2018.     Activities of Daily Living In your present state of health, do you have any difficulty performing the following activities: 08/19/2018 02/16/2018  Hearing? N N  Vision? N N  Difficulty concentrating or making decisions? N N  Walking or climbing stairs? Y N  Comment uses railing. -  Dressing or bathing? N N  Doing errands, shopping? N N  Preparing Food and eating ? N -  Using the Toilet? N -  In the past six months, have you accidently leaked urine? N -  Do you have problems with loss of bowel control? N -  Managing your Medications? N -  Managing your Finances? N -  Housekeeping or managing your Housekeeping? N -  Some recent data might be hidden    Patient Care Team: Caroline Simmons, Caroline Filler, MD as PCP - General (Family Medicine) Caroline Pigg, MD as Consulting Physician (Obstetrics and Gynecology)    Assessment:   This is a routine wellness  examination for Caroline Simmons. Physical assessment deferred to PCP.  Exercise Activities and Dietary recommendations Current Exercise Habits: Home exercise routine, Time (Minutes): 60, Frequency (Times/Week): 5, Weekly Exercise (Minutes/Week): 300, Exercise limited by: None identified   Diet (meal preparation, eat out, water intake, caffeinated beverages, dairy products, fruits and vegetables): well balanced, on average, 2 meals per day  Goals    . Decrease foot pain       Fall Risk Fall Risk  08/19/2018 06/27/2018 04/19/2017 05/27/2016 05/01/2015  Falls in the past year? 1 1 Yes Yes No  Number falls in past yr: 0 1 1 1  -  Injury with Fall? 0 0 No No -  Comment - - - - -  Risk for fall due to : - Impaired balance/gait;Impaired mobility Impaired mobility;Impaired balance/gait - -  Follow up - Falls evaluation completed;Education provided;Falls prevention discussed Falls evaluation completed;Education provided;Falls prevention discussed - -   Depression Screen PHQ 2/9 Scores 08/19/2018 05/27/2016 05/01/2015 03/13/2015  PHQ - 2 Score 0 3 0 2  PHQ- 9 Score - 11 0 8     Cognitive Function MMSE - Mini Mental State Exam 08/19/2018 05/27/2016  Orientation to time 5 5  Orientation to Place 5 5  Registration 3 3  Attention/ Calculation 5 4  Recall 1 2  Language- name 2 objects 2 2  Language- repeat 0 1  Language- follow 3 step command 3 3  Language- read & follow direction 1 1  Write a sentence 1 1  Copy design 1 1  Total score 27 28        Immunization History  Administered Date(s) Administered  . Influenza Split 05/13/2012  . Influenza,inj,Quad PF,6+ Mos 05/30/2013, 04/05/2014, 03/13/2015, 02/19/2016, 02/16/2018  . Influenza-Unspecified 04/08/2011  . Pneumococcal Polysaccharide-23 05/01/2015  . Td 09/20/2006  . Tdap 01/06/2017   Screening Tests Health Maintenance  Topic Date Due  . HIV Screening  09/04/1971  . PAP SMEAR-Modifier  02/20/2018  . FOOT EXAM  05/10/2018  . MAMMOGRAM   06/23/2018  . HEMOGLOBIN A1C  08/19/2018  . OPHTHALMOLOGY EXAM  10/21/2018  . COLONOSCOPY  06/22/2020  . TETANUS/TDAP  01/07/2027  . INFLUENZA VACCINE  Completed  . PNEUMOCOCCAL POLYSACCHARIDE VACCINE AGE 72-64 HIGH RISK  Completed  . Hepatitis C Screening  Completed       Plan:    Please schedule your next medicare wellness visit with me in 1 yr.  Continue to eat heart healthy diet (full of fruits, vegetables, whole grains, lean protein, water--limit salt, fat, and sugar intake) and increase physical activity as tolerated.  Continue doing brain stimulating activities (puzzles, reading, adult coloring books, staying active) to keep memory sharp.   Bring a copy of your living will and/or healthcare power of attorney to your next office visit.   I have personally reviewed and noted the following in the patient's chart:   . Medical and social history . Use of alcohol, tobacco or illicit drugs  . Current medications and supplements . Functional ability and status . Nutritional status . Physical activity . Advanced directives . List of other physicians . Hospitalizations, surgeries, and ER visits in previous 12 months . Vitals . Screenings to include cognitive, depression, and falls . Referrals and appointments  In addition, I have reviewed and discussed with patient certain preventive protocols, quality metrics, and best practice recommendations. A written personalized care plan for preventive services as well as general preventive health recommendations were provided to patient.     Naaman Plummer [place], [place]  08/19/2018

## 2018-08-19 NOTE — Patient Instructions (Signed)
Please schedule your next medicare wellness visit with me in 1 yr.  Continue to eat heart healthy diet (full of fruits, vegetables, whole grains, lean protein, water--limit salt, fat, and sugar intake) and increase physical activity as tolerated.  Continue doing brain stimulating activities (puzzles, reading, adult coloring books, staying active) to keep memory sharp.   Bring a copy of your living will and/or healthcare power of attorney to your next office visit.   Caroline Simmons , Thank you for taking time to come for your Medicare Wellness Visit. I appreciate your ongoing commitment to your health goals. Please review the following plan we discussed and let me know if I can assist you in the future.   These are the goals we discussed: Goals    . Decrease foot pain       This is a list of the screening recommended for you and due dates:  Health Maintenance  Topic Date Due  . HIV Screening  09/04/1971  . Pap Smear  02/20/2018  . Complete foot exam   05/10/2018  . Mammogram  06/23/2018  . Hemoglobin A1C  08/19/2018  . Eye exam for diabetics  10/21/2018  . Colon Cancer Screening  06/22/2020  . Tetanus Vaccine  01/07/2027  . Flu Shot  Completed  . Pneumococcal vaccine  Completed  .  Hepatitis C: One time screening is recommended by Center for Disease Control  (CDC) for  adults born from [birth year] through 1964.   Completed    Health Maintenance After Age 66 After age 70, you are at a higher risk for certain long-term diseases and infections as well as injuries from falls. Falls are a major cause of broken bones and head injuries in people who are older than age 86. Getting regular preventive care can help to keep you healthy and well. Preventive care includes getting regular testing and making lifestyle changes as recommended by your health care provider. Talk with your health care provider about:  Which screenings and tests you should have. A screening is a test that checks for a disease  when you have no symptoms.  A diet and exercise plan that is right for you. What should I know about screenings and tests to prevent falls? Screening and testing are the best ways to find a health problem early. Early diagnosis and treatment give you the best chance of managing medical conditions that are common after age 67. Certain conditions and lifestyle choices may make you more likely to have a fall. Your health care provider may recommend:  Regular vision checks. Poor vision and conditions such as cataracts can make you more likely to have a fall. If you wear glasses, make sure to get your prescription updated if your vision changes.  Medicine review. Work with your health care provider to regularly review all of the medicines you are taking, including over-the-counter medicines. Ask your health care provider about any side effects that may make you more likely to have a fall. Tell your health care provider if any medicines that you take make you feel dizzy or sleepy.  Osteoporosis screening. Osteoporosis is a condition that causes the bones to get weaker. This can make the bones weak and cause them to break more easily.  Blood pressure screening. Blood pressure changes and medicines to control blood pressure can make you feel dizzy.  Strength and balance checks. Your health care provider may recommend certain tests to check your strength and balance while standing, walking, or changing positions.  Foot health exam. Foot pain and numbness, as well as not wearing proper footwear, can make you more likely to have a fall.  Depression screening. You may be more likely to have a fall if you have a fear of falling, feel emotionally low, or feel unable to do activities that you used to do.  Alcohol use screening. Using too much alcohol can affect your balance and may make you more likely to have a fall. What actions can I take to lower my risk of falls? General instructions  Talk with your  health care provider about your risks for falling. Tell your health care provider if: ? You fall. Be sure to tell your health care provider about all falls, even ones that seem minor. ? You feel dizzy, sleepy, or off-balance.  Take over-the-counter and prescription medicines only as told by your health care provider. These include any supplements.  Eat a healthy diet and maintain a healthy weight. A healthy diet includes low-fat dairy products, low-fat (lean) meats, and fiber from whole grains, beans, and lots of fruits and vegetables. Home safety  Remove any tripping hazards, such as rugs, cords, and clutter.  Install safety equipment such as grab bars in bathrooms and safety rails on stairs.  Keep rooms and walkways well-lit. Activity   Follow a regular exercise program to stay fit. This will help you maintain your balance. Ask your health care provider what types of exercise are appropriate for you.  If you need a cane or walker, use it as recommended by your health care provider.  Wear supportive shoes that have nonskid soles. Lifestyle  Do not drink alcohol if your health care provider tells you not to drink.  If you drink alcohol, limit how much you have: ? 0-1 drink a day for women. ? 0-2 drinks a day for men.  Be aware of how much alcohol is in your drink. In the [place] one drink equals one typical bottle of beer (12 oz), one-half glass of wine (5 oz), or one shot of hard liquor (1 oz).  Do not use any products that contain nicotine or tobacco, such as cigarettes and e-cigarettes. If you need help quitting, ask your health care provider. Summary  Having a healthy lifestyle and getting preventive care can help to protect your health and wellness after age 14.  Screening and testing are the best way to find a health problem early and help you avoid having a fall. Early diagnosis and treatment give you the best chance for managing medical conditions that are more common for  people who are older than age 41.  Falls are a major cause of broken bones and head injuries in people who are older than age 71. Take precautions to prevent a fall at home.  Work with your health care provider to learn what changes you can make to improve your health and wellness and to prevent falls. This information is not intended to replace advice given to you by your health care provider. Make sure you discuss any questions you have with your health care provider. Document Released: 04/21/2017 Document Revised: 04/21/2017 Document Reviewed: 04/21/2017 Elsevier Interactive Patient Education  2019 [employer].

## 2018-08-19 NOTE — Progress Notes (Signed)
Noted. Agree with above.  [place], DO 08/19/18 3:12 PM

## 2018-08-19 NOTE — Patient Instructions (Signed)
Wait until Sunday to take the new dose of lisinopril 20 mg. You can cut your current pill in half if the pharmacy does not take it back. A new dose has been called in though.  Keep the diet clean and stay active.  Consider Vit B10/Biotin to help with your hair. Do not pull it tight or put dyes in it as this can irritate things.  Let us know if you need anything.

## 2018-08-20 LAB — CBC
HCT: 37.2 % (ref 35.0–45.0)
HEMOGLOBIN: 12.8 g/dL (ref 11.7–15.5)
MCH: 31 pg (ref 27.0–33.0)
MCHC: 34.4 g/dL (ref 32.0–36.0)
MCV: 90.1 fL (ref 80.0–100.0)
MPV: 11 fL (ref 7.5–12.5)
Platelets: 239 10*3/uL (ref 140–400)
RBC: 4.13 10*6/uL (ref 3.80–5.10)
RDW: 13.2 % (ref 11.0–15.0)
WBC: 7.9 10*3/uL (ref 3.8–10.8)

## 2018-08-20 LAB — IRON,TIBC AND FERRITIN PANEL
%SAT: 38 % (calc) (ref 16–45)
Ferritin: 37 ng/mL (ref 16–288)
Iron: 108 ug/dL (ref 45–160)
TIBC: 283 mcg/dL (calc) (ref 250–450)

## 2018-08-20 LAB — TSH: TSH: 1.67 mIU/L (ref 0.40–4.50)

## 2018-08-20 LAB — T4, FREE: Free T4: 1 ng/dL (ref 0.8–1.8)

## 2018-08-31 NOTE — Progress Notes (Addendum)
[facility] [address], [place], [place] [address] [phone]  Date:  09/01/2018   Name:  Caroline Simmons   DOB:  May 28, 1956   MRN:  [id number]  PCP:  Darreld Mclean, MD    Chief Complaint: Hypertension (2 week follow up blood pressure )   History of Present Illness:  Caroline Simmons is a 63 y.o. very pleasant female patient who presents with the following:  History of chronic kidney disease, diet controlled diabetes, neuropathy, hypothyroidism, hypertension Seen on February 28 my partner Dr. Nani Ravens, here today for short-term follow-up of her blood pressure I have recently increased her ACE inhibitor from 20 mg to 40 mg, and she had complained of dizziness and headaches.  Her blood pressure at that visit was 138/88 They did decrease her lisinopril back to 20 mg, recommended that she check her blood pressure at home if at all possible Dr. Nani Ravens also ran some labs for complaint of hair loss-these were not revealing. She continues to be concerned about hair loss  She is feeling better on 20 mg of lisinopril, and her blood pressure does look fine  BP Readings from Last 3 Encounters:  09/01/18 126/90  08/19/18 138/88  08/19/18 138/88   Pap: per her GYN, UTD Foot exam is due A1c is due  She is a patient [facility]- she reports that she was seen 4 months ago, will be seen later on this month  She also is a neurology patient for her neuropathy.  She reports they have recently referred her to pain management for her foot pain  She ate breakfast about 3 hours ago   Lab Results  Component Value Date   HGBA1C 6.7 (H) 02/16/2018     Patient Active Problem List   Diagnosis Date Noted  . Chronic kidney disease, stage 3 ([place]) 04/08/2016  . Dyslipidemia associated with type 2 diabetes mellitus ([place]) 05/30/2013  . Detached retina, left 11/25/2011  . Depression with anxiety 11/25/2011  . Neuropathy  in diabetes ([place]) 09/15/2011  . Insomnia, unspecified   . Hypothyroidism   . DIABETES MELLITUS, TYPE II 08/26/2010  . HYPERTENSION 08/26/2010  . RECTAL PAIN 08/26/2010    Past Medical History:  Diagnosis Date  . Depression   . Essential hypertension, benign   . Hypothyroid   . Insomnia, unspecified   . Menopause    lmp 06/2007  . Type II or unspecified type diabetes mellitus without mention of complication, not stated as uncontrolled     Past Surgical History:  Procedure Laterality Date  . [place]  . EYE SURGERY    . RETINAL DETACHMENT REPAIR W/ SCLERAL BUCKLE LE  11/12/2011    Social History   Tobacco Use  . Smoking status: Former Smoker    Packs/day: 1.00    Years: 3.00    Pack years: 3.00    Types: Cigarettes  . Smokeless tobacco: Never Used  . Tobacco comment: quit 08/2003  Substance Use Topics  . Alcohol use: Yes    Comment: rare-beer or wine  . Drug use: No    Family History  Problem Relation Age of Onset  . Diabetes Mother        type 2  not sure onset  . Diabetes Other   . Hypertension Other     Allergies  Allergen Reactions  . Codeine Nausea Only    Pt states she has taken this with  no problems.  . Zoloft  [Sertraline Hcl]   . Lamotrigine Rash    Other reaction(s): RASH    Medication list has been reviewed and updated.  Current Outpatient Medications on File Prior to Visit  Medication Sig Dispense Refill  . ACCU-CHEK AVIVA PLUS test strip USE  1  TEST  STRIP EVERY DAY 100 each 3  . Alcohol Swabs (B-D SINGLE USE SWABS REGULAR) PADS USE  1  SWAB EVERY DAY 100 each 3  . amitriptyline (ELAVIL) 100 MG tablet TAKE 1 TABLET BY MOUTH AT BEDTIME 90 tablet 1  . amitriptyline (ELAVIL) 25 MG tablet TAKE 1 TABLET BY MOUTH EVERY DAY 30 tablet 0  . aspirin 81 MG tablet Take 81 mg by mouth daily.    . carbamazepine (TEGRETOL) 200 MG tablet TAKE 2 TABLETS BY MOUTH EVERY MORNING AND 1 TABLET IN THE EVENING 270 tablet 0  .  levothyroxine (SYNTHROID, LEVOTHROID) 50 MCG tablet TAKE ONE TABLET BY MOUTH ONE TIME DAILY 90 tablet 1  . lisinopril (PRINIVIL,ZESTRIL) 20 MG tablet Take 1 tablet (20 mg total) by mouth daily. 30 tablet 3  . omeprazole (PRILOSEC) 20 MG capsule TAKE 2 CAPSULES ONCE A DAY FOR NAUSEA AND REFLUX. USE AS NEEDED- DOES NOT HAVE TO TAKE EVERY DAY 180 capsule 1  . rosuvastatin (CRESTOR) 20 MG tablet Take 1 tablet (20 mg total) by mouth daily. 30 tablet 11   No current facility-administered medications on file prior to visit.     Review of Systems:  As per HPI- otherwise negative. No fever or chills, no chest pain or shortness of breath  Physical Examination: Vitals:   09/01/18 1359  BP: 126/90  Pulse: 92  Resp: 16  Temp: 97.7 F (36.5 C)  SpO2: 96%   Vitals:   09/01/18 1359  Weight: 169 lb (76.7 kg)  Height: 5\' 4"  (1.626 m)   Body mass index is 29.01 kg/m. Ideal Body Weight: Weight in (lb) to have BMI = 25: 145.3  GEN: WDWN, NAD, Non-toxic, A & O x 3, overweight, looks well  Bilateral TM wnl, oropharynx normal.  PEERL,EOMI.   She does have generalized thinning of her hair, most pronounced on the crown of her HEENT: Atraumatic, Normocephalic. Neck supple. No masses, No LAD. Ears and Nose: No external deformity. CV: RRR, No M/G/R. No JVD. No thrill. No extra heart sounds. PULM: CTA B, no wheezes, crackles, rhonchi. No retractions. No resp. distress. No accessory muscle use. EXTR: No c/c/e NEURO Normal gait.  PSYCH: Normally interactive. Conversant. Not depressed or anxious appearing.  Calm demeanor.  Foot exam: neuropathy, cannot sense monofilament at all  Assessment and Plan: Hair loss - Plan: Ambulatory referral to Dermatology  Controlled type 2 diabetes mellitus with diabetic polyneuropathy, without long-term current use of insulin ([place]) - Plan: Basic metabolic panel, Hemoglobin A1c  Chronic kidney disease, unspecified CKD stage - Plan: Basic metabolic panel  Dyslipidemia -  Plan: Lipid panel  Essential hypertension  Referral to dermatology for concern of hair loss A1c and BMP to monitor diet controlled diabetes today Cholesterol panel to monitor dyslipidemia-Crestor 5 Blood pressures okay on current regimen, continue 20 mg of lisinopril She is on amitriptyline for her foot pain, Tegretol Baby aspirin, levothyroxine 50  Signed Lamar Blinks, MD  Received her labs 3/13- letter to pt  Results for orders placed or performed in visit on 38/25/05  Basic metabolic panel  Result Value Ref Range   Sodium 142 135 - 145 mEq/L   Potassium 4.2  3.5 - 5.1 mEq/L   Chloride 104 96 - 112 mEq/L   CO2 26 19 - 32 mEq/L   Glucose, Bld 140 (H) 70 - 99 mg/dL   BUN 17 6 - 23 mg/dL   Creatinine, Ser 1.62 (H) 0.40 - 1.20 mg/dL   Calcium 9.5 8.4 - 10.5 mg/dL   GFR 38.96 (L) >60.00 mL/min  Hemoglobin A1c  Result Value Ref Range   Hgb A1c MFr Bld 6.7 (H) 4.6 - 6.5 %  Lipid panel  Result Value Ref Range   Cholesterol 185 0 - 200 mg/dL   Triglycerides 396.0 (H) 0.0 - 149.0 mg/dL   HDL 49.40 >39.00 mg/dL   VLDL 79.2 (H) 0.0 - 40.0 mg/dL   Total CHOL/HDL Ratio 4    NonHDL 135.31   LDL cholesterol, direct  Result Value Ref Range   Direct LDL 77.0 mg/dL

## 2018-09-01 ENCOUNTER — Ambulatory Visit (INDEPENDENT_AMBULATORY_CARE_PROVIDER_SITE_OTHER): Payer: Medicare HMO | Admitting: Family Medicine

## 2018-09-01 ENCOUNTER — Encounter: Payer: Self-pay | Admitting: Family Medicine

## 2018-09-01 ENCOUNTER — Other Ambulatory Visit: Payer: Self-pay

## 2018-09-01 VITALS — BP 126/90 | HR 92 | Temp 97.7°F | Resp 16 | Ht 64.0 in | Wt 169.0 lb

## 2018-09-01 DIAGNOSIS — L659 Nonscarring hair loss, unspecified: Secondary | ICD-10-CM

## 2018-09-01 DIAGNOSIS — N189 Chronic kidney disease, unspecified: Secondary | ICD-10-CM

## 2018-09-01 DIAGNOSIS — E1142 Type 2 diabetes mellitus with diabetic polyneuropathy: Secondary | ICD-10-CM

## 2018-09-01 DIAGNOSIS — E785 Hyperlipidemia, unspecified: Secondary | ICD-10-CM

## 2018-09-01 DIAGNOSIS — I10 Essential (primary) hypertension: Secondary | ICD-10-CM

## 2018-09-01 NOTE — Patient Instructions (Signed)
Good to see you today I am going to send you to dermatology to discuss your hair loss We will check on your cholesterol and A1c today Your blood pressure looks ok on current medication regimen   Please see me in 6 months assuming your labs are ok

## 2018-09-02 LAB — BASIC METABOLIC PANEL
BUN: 17 mg/dL (ref 6–23)
CHLORIDE: 104 meq/L (ref 96–112)
CO2: 26 mEq/L (ref 19–32)
Calcium: 9.5 mg/dL (ref 8.4–10.5)
Creatinine, Ser: 1.62 mg/dL — ABNORMAL HIGH (ref 0.40–1.20)
GFR: 38.96 mL/min — ABNORMAL LOW (ref >60.00)
Glucose, Bld: 140 mg/dL — ABNORMAL HIGH (ref 70–99)
Potassium: 4.2 mEq/L (ref 3.5–5.1)
SODIUM: 142 meq/L (ref 135–145)

## 2018-09-02 LAB — LIPID PANEL
Cholesterol: 185 mg/dL (ref 0–200)
HDL: 49.4 mg/dL (ref >39.00)
NonHDL: 135.31
TRIGLYCERIDES: 396 mg/dL — AB (ref 0.0–149.0)
Total CHOL/HDL Ratio: 4
VLDL: 79.2 mg/dL — ABNORMAL HIGH (ref 0.0–40.0)

## 2018-09-02 LAB — HEMOGLOBIN A1C: Hgb A1c MFr Bld: 6.7 % — ABNORMAL HIGH (ref 4.6–6.5)

## 2018-09-02 LAB — LDL CHOLESTEROL, DIRECT: Direct LDL: 77 mg/dL

## 2018-09-07 ENCOUNTER — Other Ambulatory Visit: Payer: Self-pay | Admitting: Neurology

## 2018-09-07 DIAGNOSIS — N183 Chronic kidney disease, stage 3 (moderate): Secondary | ICD-10-CM | POA: Diagnosis not present

## 2018-09-09 ENCOUNTER — Other Ambulatory Visit: Payer: Self-pay | Admitting: Neurology

## 2018-09-13 DIAGNOSIS — E1122 Type 2 diabetes mellitus with diabetic chronic kidney disease: Secondary | ICD-10-CM | POA: Diagnosis not present

## 2018-09-13 DIAGNOSIS — R809 Proteinuria, unspecified: Secondary | ICD-10-CM | POA: Diagnosis not present

## 2018-09-13 DIAGNOSIS — N183 Chronic kidney disease, stage 3 (moderate): Secondary | ICD-10-CM | POA: Diagnosis not present

## 2018-09-13 DIAGNOSIS — I129 Hypertensive chronic kidney disease with stage 1 through stage 4 chronic kidney disease, or unspecified chronic kidney disease: Secondary | ICD-10-CM | POA: Diagnosis not present

## 2018-09-13 DIAGNOSIS — E1140 Type 2 diabetes mellitus with diabetic neuropathy, unspecified: Secondary | ICD-10-CM | POA: Diagnosis not present

## 2018-09-13 DIAGNOSIS — Z87891 Personal history of nicotine dependence: Secondary | ICD-10-CM | POA: Diagnosis not present

## 2018-09-26 ENCOUNTER — Other Ambulatory Visit: Payer: Self-pay | Admitting: Family Medicine

## 2018-09-26 DIAGNOSIS — E785 Hyperlipidemia, unspecified: Secondary | ICD-10-CM

## 2018-10-05 ENCOUNTER — Other Ambulatory Visit: Payer: Self-pay | Admitting: Neurology

## 2018-10-05 ENCOUNTER — Other Ambulatory Visit: Payer: Self-pay | Admitting: Family Medicine

## 2019-01-09 ENCOUNTER — Other Ambulatory Visit: Payer: Self-pay | Admitting: Neurology

## 2019-01-09 NOTE — Telephone Encounter (Signed)
LEFT MESSAGE TO CALL OFFICE

## 2019-01-16 DIAGNOSIS — N183 Chronic kidney disease, stage 3 (moderate): Secondary | ICD-10-CM | POA: Diagnosis not present

## 2019-01-17 DIAGNOSIS — E1122 Type 2 diabetes mellitus with diabetic chronic kidney disease: Secondary | ICD-10-CM | POA: Diagnosis not present

## 2019-01-17 DIAGNOSIS — I129 Hypertensive chronic kidney disease with stage 1 through stage 4 chronic kidney disease, or unspecified chronic kidney disease: Secondary | ICD-10-CM | POA: Diagnosis not present

## 2019-01-17 DIAGNOSIS — N2581 Secondary hyperparathyroidism of renal origin: Secondary | ICD-10-CM | POA: Diagnosis not present

## 2019-01-17 DIAGNOSIS — N183 Chronic kidney disease, stage 3 (moderate): Secondary | ICD-10-CM | POA: Diagnosis not present

## 2019-02-07 ENCOUNTER — Other Ambulatory Visit: Payer: Self-pay | Admitting: Neurology

## 2019-02-07 NOTE — Progress Notes (Signed)
[place] at [facility] [address], [place], [place] [address] [phone]  Date:  02/09/2019   Name:  Caroline Simmons   DOB:  1955/08/25   MRN:  [id number]  PCP:  Caroline Mclean, MD    Chief Complaint: No chief complaint on file.   History of Present Illness:  Caroline Simmons is a 63 y.o. very pleasant female patient who presents with the following:  Virtual visit today to discuss illness Patient with history of diabetes, hypertension, hypothyroidism, hyperlipidemia, chronic kidney disease  Pt mistakenly came to the office for her virtual visit- as she is ill we cannot see her in the office.  She drove back home and I called her Patient location is home, provider is at office Patient identity confirmed with 2 factors, she gives consent for virtual visit today She does not have a cell phone so we were not able to do a video visit  Last week she had a ST, cough and sneeze, lack of appetite  She is feeling better today She is not aware of any fever, but she did have chills No vomiting  She is on disability and is not working right now She would like to be tested for covid if possible She does have some sneezing and congestion She wonders if this might be allergies   She also notes a fungal infection of the toes of her left foot only She has noted this for "a long time"- she saw podiatry and was given penlac but this did not help  She notes that she tends to be constipated for a couple of months-she is taking something over-the-counter but is not quite sure what it is, in any case it has not helped very much Suggested that she try a stool softener-can be taken daily to prevent hard stools   Lab Results  Component Value Date   HGBA1C 6.7 (H) 09/01/2018    He mother did die earlier this year at the age of 59 due to covid.  She lived in [place] Patient Active Problem List   Diagnosis Date Noted  . Chronic kidney disease,  stage 3 ([place]) 04/08/2016  . Dyslipidemia associated with type 2 diabetes mellitus ([place]) 05/30/2013  . Detached retina, left 11/25/2011  . Depression with anxiety 11/25/2011  . Neuropathy in diabetes ([place]) 09/15/2011  . Insomnia, unspecified   . Hypothyroidism   . DIABETES MELLITUS, TYPE II 08/26/2010  . HYPERTENSION 08/26/2010  . RECTAL PAIN 08/26/2010    Past Medical History:  Diagnosis Date  . Depression   . Essential hypertension, benign   . Hypothyroid   . Insomnia, unspecified   . Menopause    lmp 06/2007  . Type II or unspecified type diabetes mellitus without mention of complication, not stated as uncontrolled     Past Surgical History:  Procedure Laterality Date  . [place]  . EYE SURGERY    . RETINAL DETACHMENT REPAIR W/ SCLERAL BUCKLE LE  11/12/2011    Social History   Tobacco Use  . Smoking status: Former Smoker    Packs/day: 1.00    Years: 3.00    Pack years: 3.00    Types: Cigarettes  . Smokeless tobacco: Never Used  . Tobacco comment: quit 08/2003  Substance Use Topics  . Alcohol use: Yes    Comment: rare-beer or wine  . Drug use: No    Family History  Problem Relation Age of  Onset  . Diabetes Mother        type 2  not sure onset  . Diabetes Other   . Hypertension Other     Allergies  Allergen Reactions  . Codeine Nausea Only    Pt states she has taken this with no problems.  . Zoloft  [Sertraline Hcl]   . Lamotrigine Rash    Other reaction(s): RASH    Medication list has been reviewed and updated.  Current Outpatient Medications on File Prior to Visit  Medication Sig Dispense Refill  . ACCU-CHEK AVIVA PLUS test strip USE  1  TEST  STRIP EVERY DAY 100 each 5  . Alcohol Swabs (B-D SINGLE USE SWABS REGULAR) PADS USE  1  SWAB EVERY DAY 100 each 3  . amitriptyline (ELAVIL) 100 MG tablet TAKE 1 TABLET BY MOUTH AT BEDTIME 90 tablet 1  . amitriptyline (ELAVIL) 25 MG tablet TAKE 1 TABLET BY MOUTH EVERY DAY 30 tablet 0   . aspirin 81 MG tablet Take 81 mg by mouth daily.    . carbamazepine (TEGRETOL) 200 MG tablet TAKE 2 TABLETS BY MOUTH EVERY MORNING AND 1 TABLET IN THE EVENING 270 tablet 3  . levothyroxine (SYNTHROID, LEVOTHROID) 50 MCG tablet TAKE ONE TABLET BY MOUTH ONE TIME DAILY 90 tablet 1  . lisinopril (PRINIVIL,ZESTRIL) 20 MG tablet Take 1 tablet (20 mg total) by mouth daily. 30 tablet 3  . omeprazole (PRILOSEC) 20 MG capsule TAKE 2 CAPSULES ONCE A DAY FOR NAUSEA AND REFLUX. USE AS NEEDED- DOES NOT HAVE TO TAKE EVERY DAY 180 capsule 1  . rosuvastatin (CRESTOR) 20 MG tablet TAKE 1 TABLET BY MOUTH EVERY DAY 90 tablet 1   No current facility-administered medications on file prior to visit.     Review of Systems:  As per HPI- otherwise negative.   Physical Examination: There were no vitals filed for this visit. There were no vitals filed for this visit. There is no height or weight on file to calculate BMI. Ideal Body Weight:    Patient sounds well on the phone today.  No cough, wheezing, or distress is noted  Assessment and Plan:   ICD-10-CM   1. Onychomycosis  B35.1 terbinafine (LAMISIL) 250 MG tablet  2. Cough  R05 Novel Coronavirus, NAA ([facility])   Virtual visit today-patient was ill with URI symptoms last week.  She is now feeling improved, is in no distress.  However she may have COVID 19.  She would like to be tested, I have ordered this test instructed her where to go for testing  She notes onychomycosis of her toenails she was treated with Penlac per her podiatrist but it did not work.  Prescribed Lamisil for her to take for 12 weeks  Follow-up: No follow-ups on file.  Meds ordered this encounter  Medications  . terbinafine (LAMISIL) 250 MG tablet    Sig: Take 1 tablet (250 mg total) by mouth daily. Take for 12 weeks    Dispense:  30 tablet    Refill:  2   Orders Placed This Encounter  Procedures  . Novel Coronavirus, NAA ([facility])    @SIGN @   Spoke with pt for 10:33  today  Signed Lamar Blinks, MD

## 2019-02-09 ENCOUNTER — Other Ambulatory Visit: Payer: Self-pay

## 2019-02-09 ENCOUNTER — Encounter: Payer: Self-pay | Admitting: Family Medicine

## 2019-02-09 ENCOUNTER — Other Ambulatory Visit: Payer: Self-pay | Admitting: Neurology

## 2019-02-09 ENCOUNTER — Ambulatory Visit (INDEPENDENT_AMBULATORY_CARE_PROVIDER_SITE_OTHER): Payer: Medicare HMO | Admitting: Family Medicine

## 2019-02-09 DIAGNOSIS — Z20828 Contact with and (suspected) exposure to other viral communicable diseases: Secondary | ICD-10-CM | POA: Diagnosis not present

## 2019-02-09 DIAGNOSIS — E1142 Type 2 diabetes mellitus with diabetic polyneuropathy: Secondary | ICD-10-CM

## 2019-02-09 DIAGNOSIS — B351 Tinea unguium: Secondary | ICD-10-CM | POA: Diagnosis not present

## 2019-02-09 DIAGNOSIS — R059 Cough, unspecified: Secondary | ICD-10-CM

## 2019-02-09 DIAGNOSIS — R05 Cough: Secondary | ICD-10-CM

## 2019-02-09 MED ORDER — TERBINAFINE HCL 250 MG PO TABS: 250.0000 mg | ORAL_TABLET | Freq: Every day | ORAL | 2 refills | Status: DC

## 2019-02-20 NOTE — Progress Notes (Addendum)
[place] at [facility] [place], [place],  [address] [phone]  Date:  03/06/2019   Name:  Caroline Simmons   DOB:  03/10/1956   MRN:  [id number]  PCP:  Darreld Mclean, MD    Chief Complaint: Diabetes (6 month follow up)   History of Present Illness:  Caroline Simmons is a 63 y.o. very pleasant female patient who presents with the following:  6 month follow-up visit today History of diet controlled DM, HTN, dyslipidemia, CRI, neuropathy, depression, hypothyroidism Last seen by myself for a virtual visit one month ago with acute illness- I ordered a Covid test for her but she did not have this done   Pap: I will do for her today, unsure exact date of last screening Mammo: appears to be due- will order for her today  Eye exam: she plans to do this soon Labs/ HIV screen: due, we will do today Flu: give today  Recommend shingrix:  She is a pt at [facility] nephrology Dr Audie Clear- ? Last visit - per pt she is seen every 4 months.   She notes that "sometimes I get dizzy and everything looks purple," has noted this a couple of months ago but now resolved She notes that her back hurts if she stands up too long She may feel like she "is going to pass out" when she does yard work, but denies any CP or SOB She cannot really be more specific about this when asked to describe what she is feeling I am not sure if she is experiencing orthostatic hypotension.-Again she really cannot describe it any better than a feeling like she might pass out  She notes that she tends to have small stools that can be hard and difficult to pass  She sometimes takes something over the counter as a laxative or stool softener  Her mother died in 27-Oct-2022 from Covid infection- pt has been under a lot of stress Her mom lived in [place], she was in a nursing home when she passed away  Lab Results  Component Value Date   HGBA1C 6.7 (H) 09/01/2018    Amitriptyline Asa Tegretol synthrod Lisinopril 20 crestor  Patient Active Problem List   Diagnosis Date Noted  . Chronic kidney disease, stage 3 ([place]) 04/08/2016  . Dyslipidemia associated with type 2 diabetes mellitus ([place]) 05/30/2013  . Detached retina, left 11/25/2011  . Depression with anxiety 11/25/2011  . Neuropathy in diabetes ([place]) 09/15/2011  . Insomnia, unspecified   . Hypothyroidism   . DIABETES MELLITUS, TYPE II 08/26/2010  . HYPERTENSION 08/26/2010  . RECTAL PAIN 08/26/2010    Past Medical History:  Diagnosis Date  . Depression   . Essential hypertension, benign   . Hypothyroid   . Insomnia, unspecified   . Menopause    lmp 06/2007  . Type II or unspecified type diabetes mellitus without mention of complication, not stated as uncontrolled     Past Surgical History:  Procedure Laterality Date  . [place]  . EYE SURGERY    . RETINAL DETACHMENT REPAIR W/ SCLERAL BUCKLE LE  11/12/2011    Social History   Tobacco Use  . Smoking status: Former Smoker    Packs/day: 1.00    Years: 3.00    Pack years: 3.00    Types: Cigarettes  . Smokeless tobacco: Never Used  . Tobacco comment: quit 08/2003  Substance Use Topics  . Alcohol use:  Yes    Comment: rare-beer or wine  . Drug use: No    Family History  Problem Relation Age of Onset  . Diabetes Mother        type 2  not sure onset  . Diabetes Other   . Hypertension Other     Allergies  Allergen Reactions  . Codeine Nausea Only    Pt states she has taken this with no problems.  . Zoloft  [Sertraline Hcl]   . Lamotrigine Rash    Other reaction(s): RASH    Medication list has been reviewed and updated.  Current Outpatient Medications on File Prior to Visit  Medication Sig Dispense Refill  . ACCU-CHEK AVIVA PLUS test strip USE  1  TEST  STRIP EVERY DAY 100 each 5  . Alcohol Swabs (B-D SINGLE USE SWABS REGULAR) PADS USE  1  SWAB EVERY DAY 100 each 3  . amitriptyline  (ELAVIL) 100 MG tablet TAKE 1 TABLET BY MOUTH EVERYDAY AT BEDTIME 90 tablet 1  . amitriptyline (ELAVIL) 25 MG tablet TAKE 1 TABLET BY MOUTH EVERY DAY 90 tablet 1  . amLODipine (NORVASC) 2.5 MG tablet Take 2.5 mg by mouth daily.    [place] aspirin 81 MG tablet Take 81 mg by mouth daily.    . carbamazepine (TEGRETOL) 200 MG tablet TAKE 2 TABLETS BY MOUTH EVERY MORNING AND 1 TABLET IN THE EVENING 270 tablet 3  . levothyroxine (SYNTHROID, LEVOTHROID) 50 MCG tablet TAKE ONE TABLET BY MOUTH ONE TIME DAILY 90 tablet 1  . lisinopril (PRINIVIL,ZESTRIL) 20 MG tablet Take 1 tablet (20 mg total) by mouth daily. 30 tablet 3  . omeprazole (PRILOSEC) 20 MG capsule TAKE 2 CAPSULES ONCE A DAY FOR NAUSEA AND REFLUX. USE AS NEEDED- DOES NOT HAVE TO TAKE EVERY DAY 180 capsule 1  . rosuvastatin (CRESTOR) 20 MG tablet TAKE 1 TABLET BY MOUTH EVERY DAY 90 tablet 1  . terbinafine (LAMISIL) 250 MG tablet Take 1 tablet (250 mg total) by mouth daily. Take for 12 weeks 30 tablet 2   No current facility-administered medications on file prior to visit.     Review of Systems:  As per HPI- otherwise negative. No fever or chills Patient denies any chest pain or shortness of breath  Physical Examination: Vitals:   03/06/19 1401  BP: 124/82  Pulse: 83  Resp: 16  Temp: (!) 97.4 F (36.3 C)  SpO2: 98%   Vitals:   03/06/19 1401  Weight: 168 lb (76.2 kg)  Height: 5\' 4"  (1.626 m)   Body mass index is 28.84 kg/m. Ideal Body Weight: Weight in (lb) to have BMI = 25: 145.3  GEN: WDWN, NAD, Non-toxic, A & O x 3, overweight, looks well  HEENT: Atraumatic, Normocephalic. Neck supple. No masses, No LAD. Ears and Nose: No external deformity. CV: RRR, No M/G/R. No JVD. No thrill. No extra heart sounds. PULM: CTA B, no wheezes, crackles, rhonchi. No retractions. No resp. distress. No accessory muscle use. ABD: S, NT, ND, +BS. No rebound. No HSM. EXTR: No c/c/e NEURO Normal gait.  PSYCH: Normally interactive. Conversant. Not  depressed or anxious appearing.  Calm demeanor.  Pelvic: normal, no vaginal lesions or discharge. Uterus normal, no CMT, no adnexal tendereness or masses.  Pap collected  EKG: SR with left partial BBB Compared with tracing from 04/2017 no significant change noted  Assessment and Plan: Controlled type 2 diabetes mellitus with diabetic polyneuropathy, without long-term current use of insulin ([place]) - Plan: Comprehensive metabolic panel,  Hemoglobin A1c  Chronic kidney disease, unspecified CKD stage - Plan: Comprehensive metabolic panel  Dyslipidemia - Plan: Lipid panel  Hypothyroidism due to acquired atrophy of thyroid - Plan: TSH  Essential hypertension - Plan: CBC, Comprehensive metabolic panel  Needs flu shot  Screening for HIV (human immunodeficiency virus) - Plan: HIV Antibody (routine testing w rflx)  Screening for cervical cancer - Plan: Cytology - PAP  Pre-syncope - Plan: EKG 12-Lead  Screening for breast cancer - Plan: MM 3D SCREEN BREAST BILATERAL   Here today for a follow-up visit Labs pending for chronic renal disease, hyperlipidemia, hypothyroidism, hypertension, diabetes Patient has noticed possible presyncopal symptoms.  EKG is unchanged and nonacute. Discussed doing a cardiac stress test.  For the time being the patient would like to defer, but if all of her labs are normal she will consider doing a stress  referral for mammogram Pap collected Flu shot   Signed Lamar Blinks, MD  Received her labs 9/15- letter to pt  Results for orders placed or performed in visit on 03/06/19  CBC  Result Value Ref Range   WBC 6.7 4.0 - 10.5 K/uL   RBC 3.70 (L) 3.87 - 5.11 Mil/uL   Platelets 210.0 150.0 - 400.0 K/uL   Hemoglobin 11.5 (L) 12.0 - 15.0 g/dL   HCT 34.0 (L) 36.0 - 46.0 %   MCV 92.0 78.0 - 100.0 fl   MCHC 33.8 30.0 - 36.0 g/dL   RDW 13.4 11.5 - 15.5 %  Comprehensive metabolic panel  Result Value Ref Range   Sodium 139 135 - 145 mEq/L   Potassium 4.5 3.5  - 5.1 mEq/L   Chloride 103 96 - 112 mEq/L   CO2 27 19 - 32 mEq/L   Glucose, Bld 128 (H) 70 - 99 mg/dL   BUN 22 6 - 23 mg/dL   Creatinine, Ser 1.62 (H) 0.40 - 1.20 mg/dL   Total Bilirubin 0.3 0.2 - 1.2 mg/dL   Alkaline Phosphatase 70 39 - 117 U/L   AST 15 0 - 37 U/L   ALT 15 0 - 35 U/L   Total Protein 6.6 6.0 - 8.3 g/dL   Albumin 4.4 3.5 - 5.2 g/dL   Calcium 9.3 8.4 - 10.5 mg/dL   GFR 38.89 (L) >60.00 mL/min  Hemoglobin A1c  Result Value Ref Range   Hgb A1c MFr Bld 6.8 (H) 4.6 - 6.5 %  Lipid panel  Result Value Ref Range   Cholesterol 178 0 - 200 mg/dL   Triglycerides 269.0 (H) 0.0 - 149.0 mg/dL   HDL 49.40 >39.00 mg/dL   VLDL 53.8 (H) 0.0 - 40.0 mg/dL   Total CHOL/HDL Ratio 4    NonHDL 128.85   TSH  Result Value Ref Range   TSH 1.78 0.35 - 4.50 uIU/mL  HIV Antibody (routine testing w rflx)  Result Value Ref Range   HIV 1&2 Ab, 4th Generation NON-REACTIVE NON-REACTI  LDL cholesterol, direct  Result Value Ref Range   Direct LDL 76.0 mg/dL   Received her pap 9/18- letter to pt   Adequacy Satisfactory for evaluation. The presence or absence of an endocervical / transformation zone component cannot be determined because of atrophy.  Diagnosis NEGATIVE FOR INTRAEPITHELIAL LESIONS OR MALIGNANCY. ATROPHY WITH INFLAMMATION (ATROPHIC VAGINITIS).   HPV NOT Detected   Comment: Normal Reference Range - NOT Detected

## 2019-03-06 ENCOUNTER — Other Ambulatory Visit: Payer: Self-pay

## 2019-03-06 ENCOUNTER — Ambulatory Visit (INDEPENDENT_AMBULATORY_CARE_PROVIDER_SITE_OTHER): Payer: Medicare HMO | Admitting: Family Medicine

## 2019-03-06 ENCOUNTER — Encounter: Payer: Self-pay | Admitting: Family Medicine

## 2019-03-06 ENCOUNTER — Other Ambulatory Visit (HOSPITAL_COMMUNITY)
Admission: RE | Admit: 2019-03-06 | Discharge: 2019-03-06 | Disposition: A | Discharge status: Home / Self Care | Payer: Medicare HMO | Source: Ambulatory Visit | Attending: Family Medicine | Admitting: Family Medicine

## 2019-03-06 VITALS — BP 124/82 | HR 83 | Temp 97.4°F | Resp 16 | Ht 64.0 in | Wt 168.0 lb

## 2019-03-06 DIAGNOSIS — E785 Hyperlipidemia, unspecified: Secondary | ICD-10-CM | POA: Diagnosis not present

## 2019-03-06 DIAGNOSIS — N189 Chronic kidney disease, unspecified: Secondary | ICD-10-CM | POA: Diagnosis not present

## 2019-03-06 DIAGNOSIS — E034 Atrophy of thyroid (acquired): Secondary | ICD-10-CM

## 2019-03-06 DIAGNOSIS — Z23 Encounter for immunization: Secondary | ICD-10-CM | POA: Diagnosis not present

## 2019-03-06 DIAGNOSIS — I10 Essential (primary) hypertension: Secondary | ICD-10-CM | POA: Diagnosis not present

## 2019-03-06 DIAGNOSIS — Z114 Encounter for screening for human immunodeficiency virus [HIV]: Secondary | ICD-10-CM

## 2019-03-06 DIAGNOSIS — E1142 Type 2 diabetes mellitus with diabetic polyneuropathy: Secondary | ICD-10-CM | POA: Diagnosis not present

## 2019-03-06 DIAGNOSIS — Z1239 Encounter for other screening for malignant neoplasm of breast: Secondary | ICD-10-CM

## 2019-03-06 DIAGNOSIS — R55 Syncope and collapse: Secondary | ICD-10-CM | POA: Diagnosis not present

## 2019-03-06 DIAGNOSIS — Z1151 Encounter for screening for human papillomavirus (HPV): Secondary | ICD-10-CM | POA: Diagnosis not present

## 2019-03-06 DIAGNOSIS — Z124 Encounter for screening for malignant neoplasm of cervix: Secondary | ICD-10-CM | POA: Diagnosis not present

## 2019-03-06 DIAGNOSIS — D649 Anemia, unspecified: Secondary | ICD-10-CM

## 2019-03-06 NOTE — Patient Instructions (Addendum)
Good to see you again today- I will be in touch with your labs and pap asap  You got your flu shot today Will order a mammogram for you  If your labs all look normal I will set you up for a stress test to look at your heart in further detail

## 2019-03-07 LAB — LDL CHOLESTEROL, DIRECT: Direct LDL: 76 mg/dL

## 2019-03-07 LAB — LIPID PANEL
Cholesterol: 178 mg/dL (ref 0–200)
HDL: 49.4 mg/dL (ref >39.00)
NonHDL: 128.85
Total CHOL/HDL Ratio: 4
Triglycerides: 269 mg/dL — ABNORMAL HIGH (ref 0.0–149.0)
VLDL: 53.8 mg/dL — ABNORMAL HIGH (ref 0.0–40.0)

## 2019-03-07 LAB — COMPREHENSIVE METABOLIC PANEL
ALT: 15 U/L (ref 0–35)
AST: 15 U/L (ref 0–37)
Albumin: 4.4 g/dL (ref 3.5–5.2)
Alkaline Phosphatase: 70 U/L (ref 39–117)
BUN: 22 mg/dL (ref 6–23)
CO2: 27 mEq/L (ref 19–32)
Calcium: 9.3 mg/dL (ref 8.4–10.5)
Chloride: 103 mEq/L (ref 96–112)
Creatinine, Ser: 1.62 mg/dL — ABNORMAL HIGH (ref 0.40–1.20)
GFR: 38.89 mL/min — ABNORMAL LOW (ref >60.00)
Glucose, Bld: 128 mg/dL — ABNORMAL HIGH (ref 70–99)
Potassium: 4.5 mEq/L (ref 3.5–5.1)
Sodium: 139 mEq/L (ref 135–145)
Total Bilirubin: 0.3 mg/dL (ref 0.2–1.2)
Total Protein: 6.6 g/dL (ref 6.0–8.3)

## 2019-03-07 LAB — CBC
HCT: 34 % — ABNORMAL LOW (ref 36.0–46.0)
Hemoglobin: 11.5 g/dL — ABNORMAL LOW (ref 12.0–15.0)
MCHC: 33.8 g/dL (ref 30.0–36.0)
MCV: 92 fl (ref 78.0–100.0)
Platelets: 210 10*3/uL (ref 150.0–400.0)
RBC: 3.7 Mil/uL — ABNORMAL LOW (ref 3.87–5.11)
RDW: 13.4 % (ref 11.5–15.5)
WBC: 6.7 10*3/uL (ref 4.0–10.5)

## 2019-03-07 LAB — TSH: TSH: 1.78 u[IU]/mL (ref 0.35–4.50)

## 2019-03-07 LAB — HEMOGLOBIN A1C: Hgb A1c MFr Bld: 6.8 % — ABNORMAL HIGH (ref 4.6–6.5)

## 2019-03-07 LAB — HIV ANTIBODY (ROUTINE TESTING W REFLEX): HIV 1&2 Ab, 4th Generation: NONREACTIVE

## 2019-03-07 NOTE — Addendum Note (Signed)
Addended by: Lamar Blinks C on: 03/07/2019 05:32 PM   Modules accepted: Orders

## 2019-03-09 LAB — CYTOLOGY - PAP: HPV: NOT DETECTED

## 2019-03-15 ENCOUNTER — Other Ambulatory Visit: Payer: Self-pay

## 2019-03-15 ENCOUNTER — Ambulatory Visit (HOSPITAL_BASED_OUTPATIENT_CLINIC_OR_DEPARTMENT_OTHER)
Admission: RE | Admit: 2019-03-15 | Discharge: 2019-03-15 | Disposition: A | Discharge status: Home / Self Care | Payer: Medicare HMO | Source: Ambulatory Visit | Attending: Family Medicine | Admitting: Family Medicine

## 2019-03-15 DIAGNOSIS — Z1239 Encounter for other screening for malignant neoplasm of breast: Secondary | ICD-10-CM

## 2019-03-15 DIAGNOSIS — Z1231 Encounter for screening mammogram for malignant neoplasm of breast: Secondary | ICD-10-CM | POA: Diagnosis not present

## 2019-03-15 IMAGING — MG MM DIGITAL SCREENING BILAT W/ TOMO W/ CAD
6 of 10 series · 6 of 30 positions shown · non-contrast
Comparison: Previous exam(s).

CLINICAL DATA: Screening.

EXAM:
DIGITAL SCREENING BILATERAL MAMMOGRAM WITH TOMO AND CAD

[L CC synth-2D]
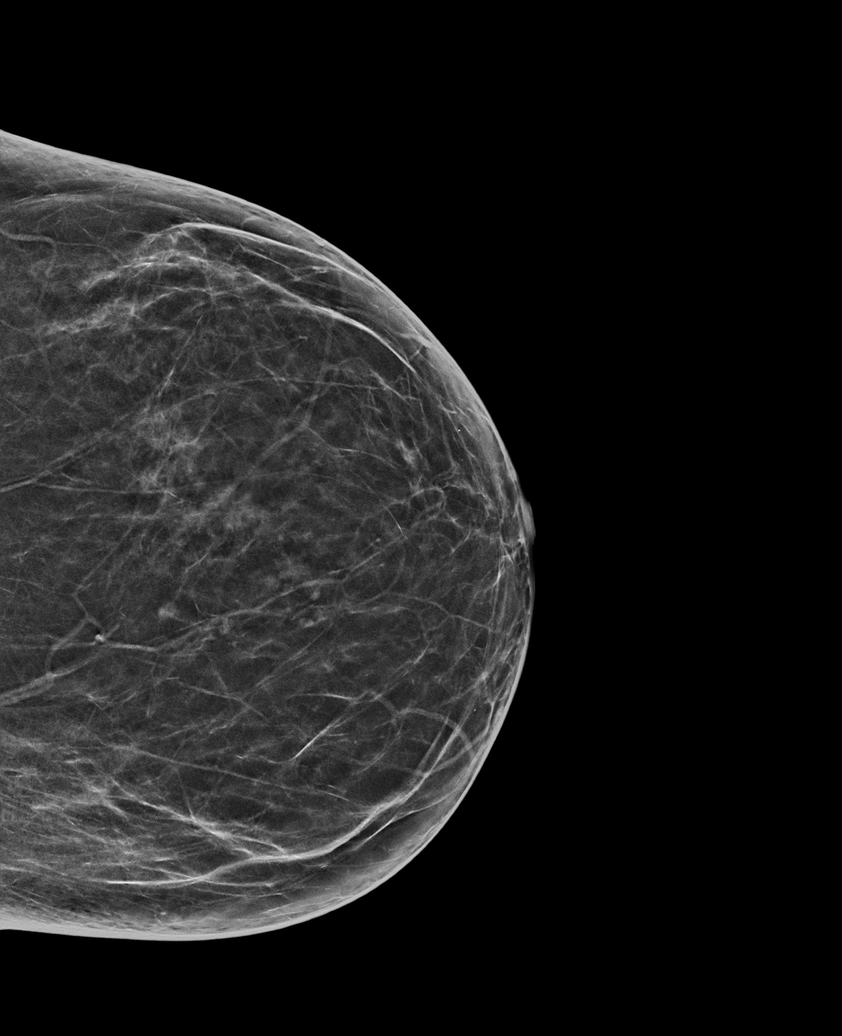

[R MLO synth-2D]
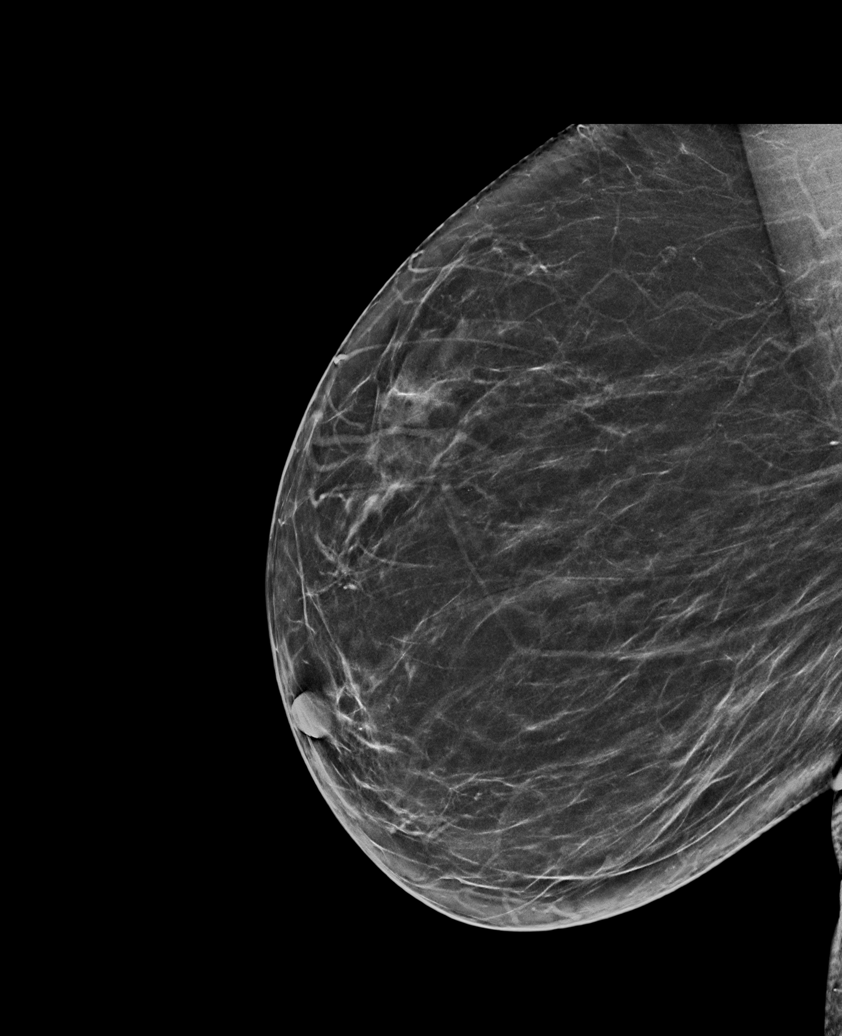

[R CC synth-2D]
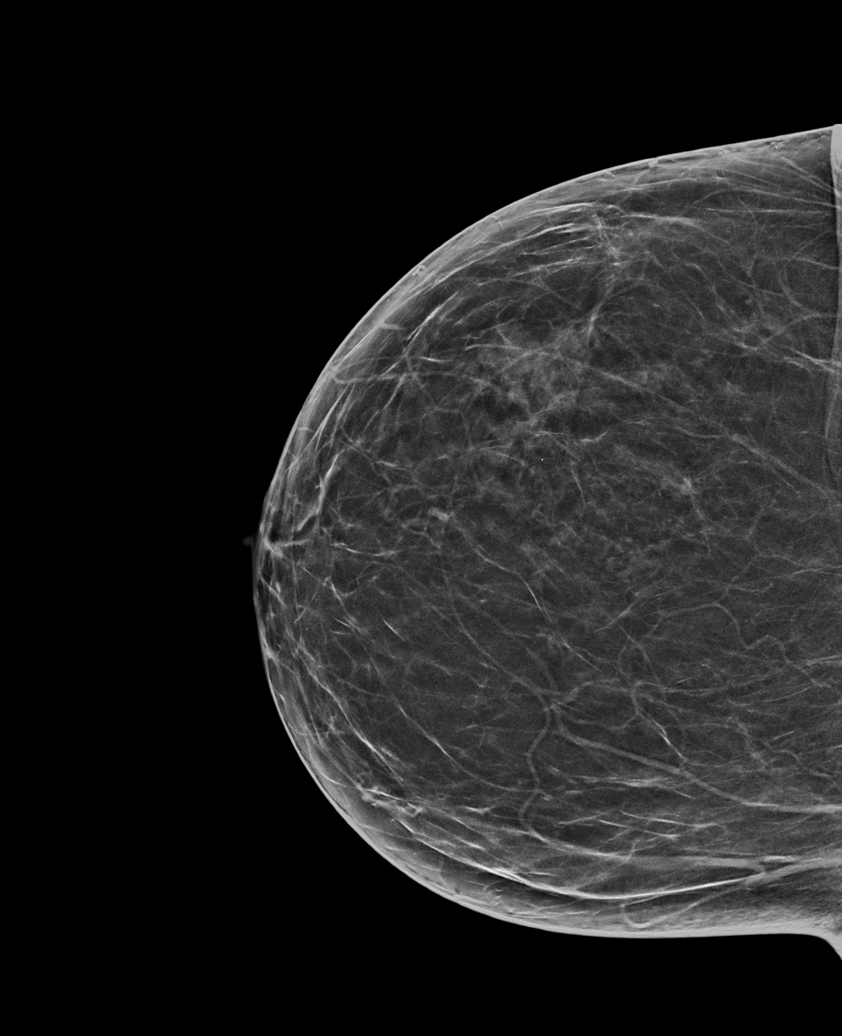

[L MLO synth-2D (1 of 2)]
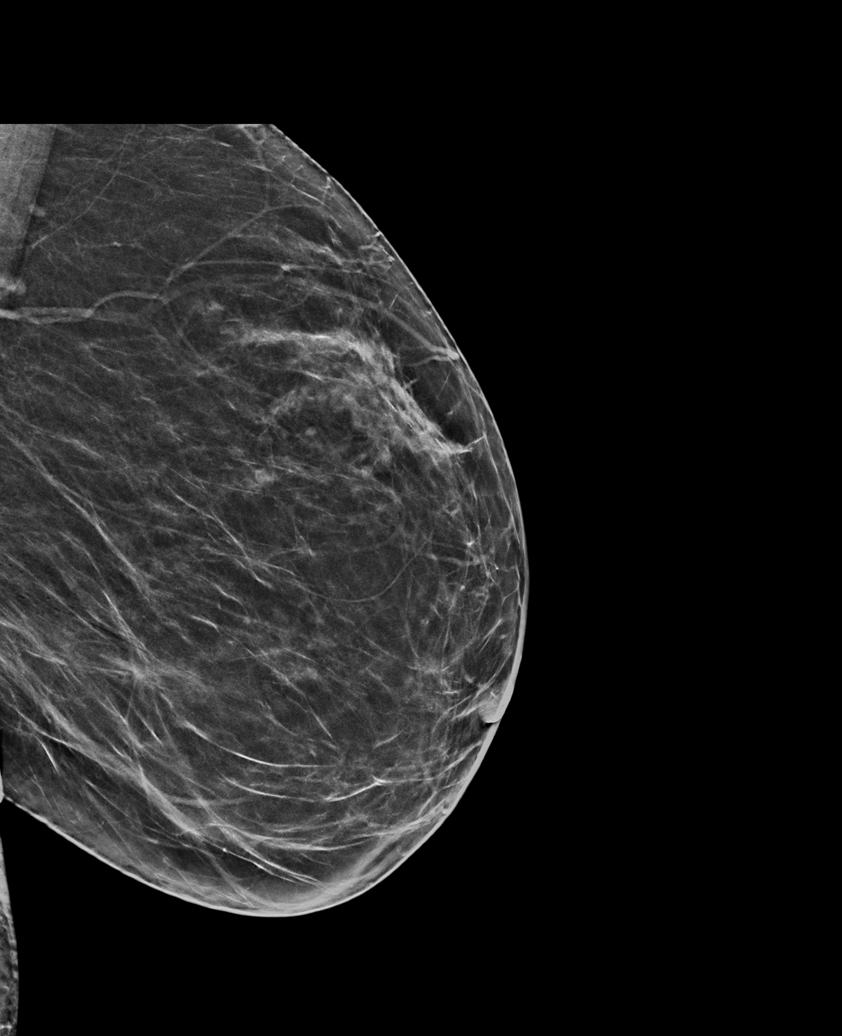

[L MLO synth-2D (2 of 2)]
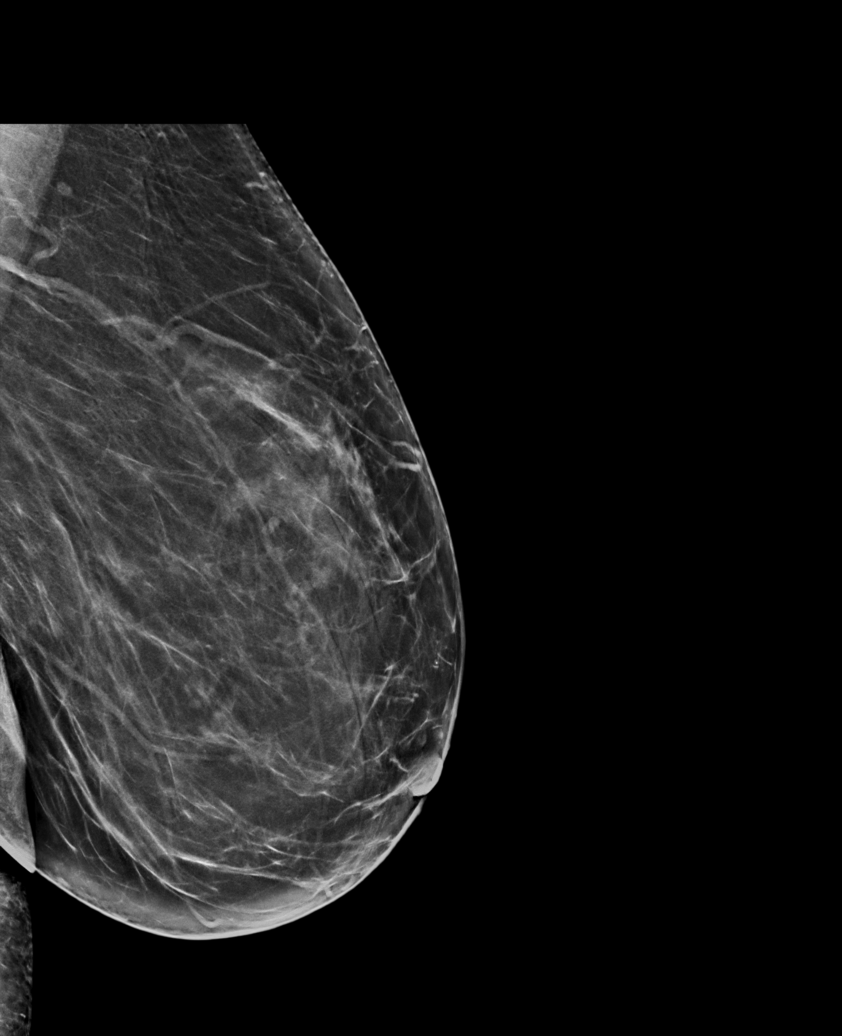

[L CC tomo · tomo slice 31/60.0]
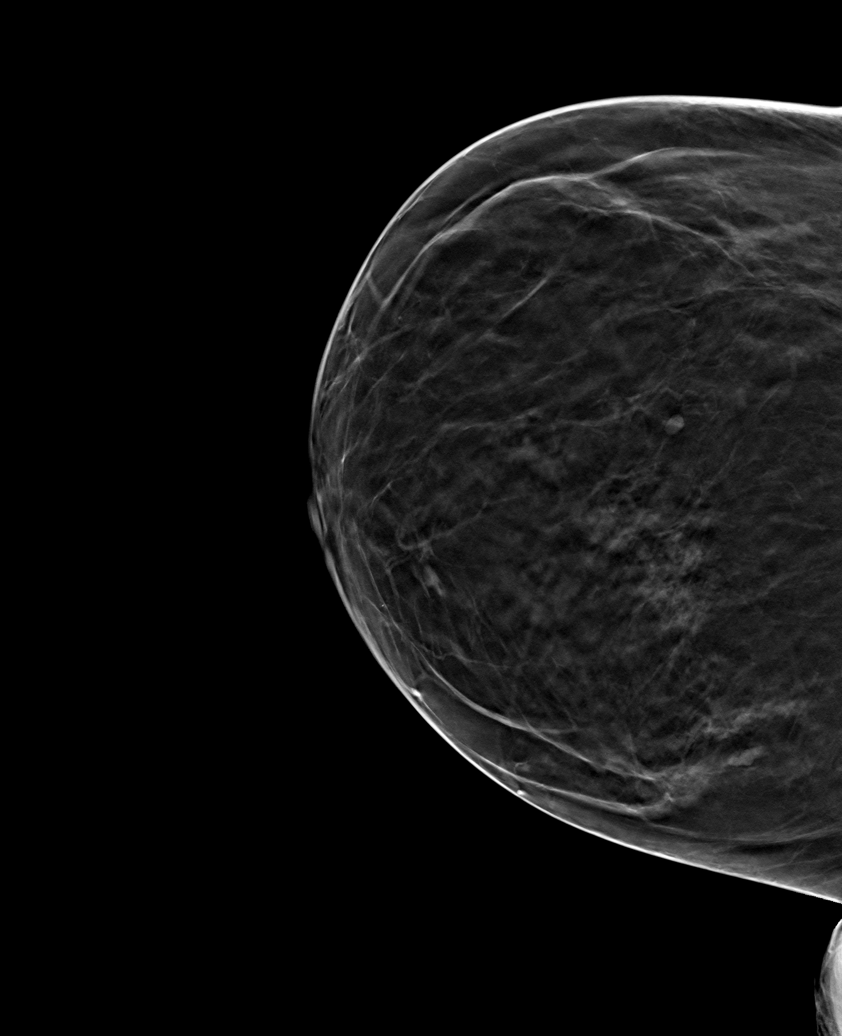

[6 of 30 positions shown; findings below may reference images not displayed]

ACR Breast Density Category b: There are scattered areas of
fibroglandular density.
FINDINGS: There are no findings suspicious for malignancy. Images were
processed with CAD.
IMPRESSION: No mammographic evidence of malignancy. A result letter of this
screening mammogram will be mailed directly to the patient.

RECOMMENDATION:
Screening mammogram in one year. (Code:[TQ])

BI-RADS CATEGORY  1: Negative.

## 2019-03-20 DIAGNOSIS — E119 Type 2 diabetes mellitus without complications: Secondary | ICD-10-CM | POA: Diagnosis not present

## 2019-03-20 DIAGNOSIS — H524 Presbyopia: Secondary | ICD-10-CM | POA: Diagnosis not present

## 2019-03-20 DIAGNOSIS — Z0100 Encounter for examination of eyes and vision without abnormal findings: Secondary | ICD-10-CM | POA: Diagnosis not present

## 2019-03-27 ENCOUNTER — Other Ambulatory Visit: Payer: Self-pay | Admitting: Family Medicine

## 2019-03-27 DIAGNOSIS — E785 Hyperlipidemia, unspecified: Secondary | ICD-10-CM

## 2019-05-15 ENCOUNTER — Other Ambulatory Visit: Payer: Self-pay | Admitting: Family Medicine

## 2019-05-15 DIAGNOSIS — B351 Tinea unguium: Secondary | ICD-10-CM

## 2019-05-15 DIAGNOSIS — E034 Atrophy of thyroid (acquired): Secondary | ICD-10-CM

## 2019-05-19 DIAGNOSIS — N183 Chronic kidney disease, stage 3 unspecified: Secondary | ICD-10-CM | POA: Diagnosis not present

## 2019-05-23 DIAGNOSIS — N2581 Secondary hyperparathyroidism of renal origin: Secondary | ICD-10-CM | POA: Diagnosis not present

## 2019-05-23 DIAGNOSIS — N1832 Chronic kidney disease, stage 3b: Secondary | ICD-10-CM | POA: Diagnosis not present

## 2019-05-23 DIAGNOSIS — E1121 Type 2 diabetes mellitus with diabetic nephropathy: Secondary | ICD-10-CM | POA: Diagnosis not present

## 2019-05-23 DIAGNOSIS — I10 Essential (primary) hypertension: Secondary | ICD-10-CM | POA: Diagnosis not present

## 2019-05-23 DIAGNOSIS — Z794 Long term (current) use of insulin: Secondary | ICD-10-CM | POA: Diagnosis not present

## 2019-08-08 ENCOUNTER — Telehealth: Payer: Self-pay | Admitting: Family Medicine

## 2019-08-08 NOTE — Telephone Encounter (Signed)
Left message for patient to call office to schedule AWV with Nurse Health Advisor. Last AWV 08/19/18.

## 2019-08-15 ENCOUNTER — Other Ambulatory Visit: Payer: Self-pay | Admitting: Neurology

## 2019-08-23 NOTE — Progress Notes (Signed)
Nurse connected with patient 08/24/19 at  3:00 PM EST by a telephone enabled telemedicine application and verified that I am speaking with the correct person using two identifiers. Patient stated full name and DOB. Patient gave permission to continue with virtual visit. Patient's location was at home and Nurse's location was at [place] office.   Subjective:   Caroline Simmons is a 64 y.o. female who presents for Medicare Annual (Subsequent) preventive examination.  Review of Systems:  Home Safety/Smoke Alarms: Feels safe in home. Smoke alarms in place.  Lives w/ dtr in 2 story home. Does well w/ stairs.    Female:   Pap- 03/06/19     Mammo- 03/15/19      Dexa scan-   06/23/16.  CCS- 08/27/10. Recall 20yrs.     Objective:     Vitals:Unable to assess. This visit is enabled though telemedicine due to Covid 19.   Advanced Directives 08/24/2019 08/19/2018 05/27/2016 03/13/2015  Does Patient Have a Medical Advance Directive? No Yes Yes Yes  Type of Advance Directive - [place];Living will Living will -  Does patient want to make changes to medical advance directive? - No - Patient declined - -  Copy of [place] in Chart? - No - copy requested - No - copy requested  Would patient like information on creating a medical advance directive? No - Patient declined - - -    Tobacco Social History   Tobacco Use  Smoking Status Former Smoker  . Packs/day: 1.00  . Years: 3.00  . Pack years: 3.00  . Types: Cigarettes  Smokeless Tobacco Never Used  Tobacco Comment   quit 08/2003     Counseling given: Not Answered Comment: quit 08/2003   Clinical Intake: Pain : No/denies pain   Past Medical History:  Diagnosis Date  . Depression   . Essential hypertension, benign   . Hypothyroid   . Insomnia, unspecified   . Menopause    lmp 06/2007  . Type II or unspecified type diabetes mellitus without mention of complication, not stated as uncontrolled     Past Surgical History:  Procedure Laterality Date  . [place]  . EYE SURGERY    . RETINAL DETACHMENT REPAIR W/ SCLERAL BUCKLE LE  11/12/2011   Family History  Problem Relation Age of Onset  . Diabetes Mother        type 2  not sure onset  . Diabetes Other   . Hypertension Other    Social History   Socioeconomic History  . Marital status: Married    Spouse name: Not on file  . Number of children: 3  . Years of education: 98  . Highest education level: Not on file  Occupational History  . Occupation: disabled  Tobacco Use  . Smoking status: Former Smoker    Packs/day: 1.00    Years: 3.00    Pack years: 3.00    Types: Cigarettes  . Smokeless tobacco: Never Used  . Tobacco comment: quit 08/2003  Substance and Sexual Activity  . Alcohol use: Yes    Comment: rare-beer or wine  . Drug use: No  . Sexual activity: Not Currently  Other Topics Concern  . Not on file  Social History Narrative   Lives alone in a 2 story home.  Has 3 children.  On disability.  Did work for [employer] for 29 years.  Education: some college.    Social Determinants of Health  Financial Resource Strain:   . Difficulty of Paying Living Expenses: Not on file  Food Insecurity:   . Worried About Charity fundraiser in the Last Year: Not on file  . Ran Out of Food in the Last Year: Not on file  Transportation Needs:   . Lack of Transportation (Medical): Not on file  . Lack of Transportation (Non-Medical): Not on file  Physical Activity:   . Days of Exercise per Week: Not on file  . Minutes of Exercise per Session: Not on file  Stress:   . Feeling of Stress : Not on file  Social Connections:   . Frequency of Communication with Friends and Family: Not on file  . Frequency of Social Gatherings with Friends and Family: Not on file  . Attends Religious Services: Not on file  . Active Member of Clubs or Organizations: Not on file  . Attends Archivist  Meetings: Not on file  . Marital Status: Not on file    Outpatient Encounter Medications as of 08/24/2019  Medication Sig  . ACCU-CHEK AVIVA PLUS test strip USE  1  TEST  STRIP EVERY DAY  . Alcohol Swabs (B-D SINGLE USE SWABS REGULAR) PADS USE  1  SWAB EVERY DAY  . amitriptyline (ELAVIL) 100 MG tablet TAKE 1 TABLET BY MOUTH EVERYDAY AT BEDTIME  . amitriptyline (ELAVIL) 25 MG tablet TAKE 1 TABLET BY MOUTH EVERY DAY  . amLODipine (NORVASC) 2.5 MG tablet Take 2.5 mg by mouth daily.  Marland Kitchen aspirin 81 MG tablet Take 81 mg by mouth daily.  . carbamazepine (TEGRETOL) 200 MG tablet TAKE 2 TABLETS BY MOUTH EVERY MORNING AND 1 TABLET IN THE EVENING  . levothyroxine (SYNTHROID) 50 MCG tablet TAKE 1 TABLET BY MOUTH EVERY DAY  . lisinopril (PRINIVIL,ZESTRIL) 20 MG tablet Take 1 tablet (20 mg total) by mouth daily.  Marland Kitchen omeprazole (PRILOSEC) 20 MG capsule TAKE 2 CAPSULES ONCE A DAY FOR NAUSEA AND REFLUX. USE AS NEEDED- DOES NOT HAVE TO TAKE EVERY DAY  . rosuvastatin (CRESTOR) 20 MG tablet TAKE 1 TABLET BY MOUTH EVERY DAY  . terbinafine (LAMISIL) 250 MG tablet TAKE 1 TABLET BY MOUTH EVERY DAY FOR 12 WEEKS   No facility-administered encounter medications on file as of 08/24/2019.    Activities of Daily Living In your present state of health, do you have any difficulty performing the following activities: 08/24/2019  Hearing? N  Vision? N  Difficulty concentrating or making decisions? N  Walking or climbing stairs? N  Dressing or bathing? N  Doing errands, shopping? N  Preparing Food and eating ? N  Using the Toilet? N  In the past six months, have you accidently leaked urine? N  Do you have problems with loss of bowel control? N  Managing your Medications? N  Managing your Finances? N  Housekeeping or managing your Housekeeping? N  Some recent data might be hidden    Patient Care Team: Copland, Gay Filler, MD as PCP - General (Family Medicine) Newton Pigg, MD as Consulting Physician (Obstetrics and  Gynecology) Biagio Quint, MD as Referring Physician (Nephrology)    Assessment:   This is a routine wellness examination for Caroline Simmons. Physical assessment deferred to PCP.   Exercise Activities and Dietary recommendations Current Exercise Habits: The patient does not participate in regular exercise at present, Exercise limited by: None identified Diet (meal preparation, eat out, water intake, caffeinated beverages, dairy products, fruits and vegetables): well balanced     Goals    .  Decrease foot pain       Fall Risk Fall Risk  08/24/2019 08/19/2018 06/27/2018 04/19/2017 05/27/2016  Falls in the past year? 1 1 1  Yes Yes  Number falls in past yr: 0 0 1 1 1   Injury with Fall? 0 0 0 No No  Comment - - - - -  Risk for fall due to : - - Impaired balance/gait;Impaired mobility Impaired mobility;Impaired balance/gait -  Follow up Education provided;Falls prevention discussed - Falls evaluation completed;Education provided;Falls prevention discussed Falls evaluation completed;Education provided;Falls prevention discussed -   Depression Screen PHQ 2/9 Scores 08/24/2019 08/19/2018 05/27/2016 05/01/2015  PHQ - 2 Score 0 0 3 0  PHQ- 9 Score - - 11 0     Cognitive Function Ad8 score reviewed for issues:  Issues making decisions:no  Less interest in hobbies / activities:no  Repeats questions, stories (family complaining):no  Trouble using ordinary gadgets (microwave, computer, phone):no  Forgets the month or year: no  Mismanaging finances: no  Remembering appts:no  Daily problems with thinking and/or memory:no Ad8 score is=0    MMSE - Mini Mental State Exam 08/19/2018 05/27/2016  Orientation to time 5 5  Orientation to Place 5 5  Registration 3 3  Attention/ Calculation 5 4  Recall 1 2  Language- name 2 objects 2 2  Language- repeat 0 1  Language- follow 3 step command 3 3  Language- read & follow direction 1 1  Write a sentence 1 1  Copy design 1 1  Total score 27 28         Immunization History  Administered Date(s) Administered  . Influenza Split 05/13/2012  . Influenza,inj,Quad PF,6+ Mos 05/30/2013, 04/05/2014, 03/13/2015, 02/19/2016, 02/16/2018, 03/06/2019  . Influenza-Unspecified 04/08/2011  . Pneumococcal Polysaccharide-23 05/01/2015  . Td 09/20/2006  . Tdap 01/06/2017   Screening Tests Health Maintenance  Topic Date Due  . FOOT EXAM  09/01/2019  . HEMOGLOBIN A1C  09/03/2019  . OPHTHALMOLOGY EXAM  03/19/2020  . COLONOSCOPY  06/22/2020  . MAMMOGRAM  03/14/2021  . PAP SMEAR-Modifier  03/05/2022  . TETANUS/TDAP  01/07/2027  . INFLUENZA VACCINE  Completed  . PNEUMOCOCCAL POLYSACCHARIDE VACCINE AGE 46-64 HIGH RISK  Completed  . Hepatitis C Screening  Completed  . HIV Screening  Completed       Plan:    Please schedule your next medicare wellness visit with me in 1 yr.  Continue to eat heart healthy diet (full of fruits, vegetables, whole grains, lean protein, water--limit salt, fat, and sugar intake) and increase physical activity as tolerated.  Continue doing brain stimulating activities (puzzles, reading, adult coloring books, staying active) to keep memory sharp.     I have personally reviewed and noted the following in the patient's chart:   . Medical and social history . Use of alcohol, tobacco or illicit drugs  . Current medications and supplements . Functional ability and status . Nutritional status . Physical activity . Advanced directives . List of other physicians . Hospitalizations, surgeries, and ER visits in previous 12 months . Vitals . Screenings to include cognitive, depression, and falls . Referrals and appointments  In addition, I have reviewed and discussed with patient certain preventive protocols, quality metrics, and best practice recommendations. A written personalized care plan for preventive services as well as general preventive health recommendations were provided to patient.     Shela Nevin,  [place]  08/24/2019

## 2019-08-24 ENCOUNTER — Other Ambulatory Visit: Payer: Self-pay | Admitting: Family Medicine

## 2019-08-24 ENCOUNTER — Other Ambulatory Visit: Payer: Self-pay

## 2019-08-24 ENCOUNTER — Encounter: Payer: Self-pay | Admitting: *Deleted

## 2019-08-24 ENCOUNTER — Ambulatory Visit (INDEPENDENT_AMBULATORY_CARE_PROVIDER_SITE_OTHER): Payer: Medicare HMO | Admitting: *Deleted

## 2019-08-24 DIAGNOSIS — B351 Tinea unguium: Secondary | ICD-10-CM

## 2019-08-24 DIAGNOSIS — Z0000 Encounter for general adult medical examination without abnormal findings: Secondary | ICD-10-CM

## 2019-08-24 NOTE — Patient Instructions (Signed)
Please schedule your next medicare wellness visit with me in 1 yr.  Continue to eat heart healthy diet (full of fruits, vegetables, whole grains, lean protein, water--limit salt, fat, and sugar intake) and increase physical activity as tolerated.  Continue doing brain stimulating activities (puzzles, reading, adult coloring books, staying active) to keep memory sharp.    [name] , Thank you for taking time to come for your Medicare Wellness Visit. I appreciate your ongoing commitment to your health goals. Please review the following plan we discussed and let me know if I can assist you in the future.   These are the goals we discussed: Goals    . Decrease foot pain       This is a list of the screening recommended for you and due dates:  Health Maintenance  Topic Date Due  . Complete foot exam   09/01/2019  . Hemoglobin A1C  09/03/2019  . Eye exam for diabetics  03/19/2020  . Colon Cancer Screening  06/22/2020  . Mammogram  03/14/2021  . Pap Smear  03/05/2022  . Tetanus Vaccine  01/07/2027  . Flu Shot  Completed  . Pneumococcal vaccine  Completed  .  Hepatitis C: One time screening is recommended by Center for Disease Control  (CDC) for  adults born from [birth year] through 1964.   Completed  . HIV Screening  Completed    Preventive Care 63-26 Years Old, Female Preventive care refers to visits with your health care provider and lifestyle choices that can promote health and wellness. This includes:  A yearly physical exam. This may also be called an annual well check.  Regular dental visits and eye exams.  Immunizations.  Screening for certain conditions.  Healthy lifestyle choices, such as eating a healthy diet, getting regular exercise, not using drugs or products that contain nicotine and tobacco, and limiting alcohol use. What can I expect for my preventive care visit? Physical exam Your health care provider will check your:  Height and weight. This may be used to  calculate body mass index (BMI), which tells if you are at a healthy weight.  Heart rate and blood pressure.  Skin for abnormal spots. Counseling Your health care provider may ask you questions about your:  Alcohol, tobacco, and drug use.  Emotional well-being.  Home and relationship well-being.  Sexual activity.  Eating habits.  Work and work Statistician.  Method of birth control.  Menstrual cycle.  Pregnancy history. What immunizations do I need?  Influenza (flu) vaccine  This is recommended every year. Tetanus, diphtheria, and pertussis (Tdap) vaccine  You may need a Td booster every 10 years. Varicella (chickenpox) vaccine  You may need this if you have not been vaccinated. Zoster (shingles) vaccine  You may need this after age 62. Measles, mumps, and rubella (MMR) vaccine  You may need at least one dose of MMR if you were born in 1957 or later. You may also need a second dose. Pneumococcal conjugate (PCV13) vaccine  You may need this if you have certain conditions and were not previously vaccinated. Pneumococcal polysaccharide (PPSV23) vaccine  You may need one or two doses if you smoke cigarettes or if you have certain conditions. Meningococcal conjugate (MenACWY) vaccine  You may need this if you have certain conditions. Hepatitis A vaccine  You may need this if you have certain conditions or if you travel or work in places where you may be exposed to hepatitis A. Hepatitis B vaccine  You may need this  if you have certain conditions or if you travel or work in places where you may be exposed to hepatitis B. Haemophilus influenzae type b (Hib) vaccine  You may need this if you have certain conditions. Human papillomavirus (HPV) vaccine  If recommended by your health care provider, you may need three doses over 6 months. You may receive vaccines as individual doses or as more than one vaccine together in one shot (combination vaccines). Talk with  your health care provider about the risks and benefits of combination vaccines. What tests do I need? Blood tests  Lipid and cholesterol levels. These may be checked every 5 years, or more frequently if you are over 82 years old.  Hepatitis C test.  Hepatitis B test. Screening  Lung cancer screening. You may have this screening every year starting at age 17 if you have a 30-pack-year history of smoking and currently smoke or have quit within the past 15 years.  Colorectal cancer screening. All adults should have this screening starting at age 69 and continuing until age 8. Your health care provider may recommend screening at age 70 if you are at increased risk. You will have tests every 1-10 years, depending on your results and the type of screening test.  Diabetes screening. This is done by checking your blood sugar (glucose) after you have not eaten for a while (fasting). You may have this done every 1-3 years.  Mammogram. This may be done every 1-2 years. Talk with your health care provider about when you should start having regular mammograms. This may depend on whether you have a family history of breast cancer.  BRCA-related cancer screening. This may be done if you have a family history of breast, ovarian, tubal, or peritoneal cancers.  Pelvic exam and Pap test. This may be done every 3 years starting at age 82. Starting at age 35, this may be done every 5 years if you have a Pap test in combination with an HPV test. Other tests  Sexually transmitted disease (STD) testing.  Bone density scan. This is done to screen for osteoporosis. You may have this scan if you are at high risk for osteoporosis. Follow these instructions at home: Eating and drinking  Eat a diet that includes fresh fruits and vegetables, whole grains, lean protein, and low-fat dairy.  Take vitamin and mineral supplements as recommended by your health care provider.  Do not drink alcohol if: ? Your health  care provider tells you not to drink. ? You are pregnant, may be pregnant, or are planning to become pregnant.  If you drink alcohol: ? Limit how much you have to 0-1 drink a day. ? Be aware of how much alcohol is in your drink. In the [place] one drink equals one 12 oz bottle of beer (355 mL), one 5 oz glass of wine (148 mL), or one 1 oz glass of hard liquor (44 mL). Lifestyle  Take daily care of your teeth and gums.  Stay active. Exercise for at least 30 minutes on 5 or more days each week.  Do not use any products that contain nicotine or tobacco, such as cigarettes, e-cigarettes, and chewing tobacco. If you need help quitting, ask your health care provider.  If you are sexually active, practice safe sex. Use a condom or other form of birth control (contraception) in order to prevent pregnancy and STIs (sexually transmitted infections).  If told by your health care provider, take low-dose aspirin daily starting at age 59. What's  next?  Visit your health care provider once a year for a well check visit.  Ask your health care provider how often you should have your eyes and teeth checked.  Stay up to date on all vaccines. This information is not intended to replace advice given to you by your health care provider. Make sure you discuss any questions you have with your health care provider. Document Revised: 02/17/2018 Document Reviewed: 02/17/2018 Elsevier Patient Education  2020 [employer].

## 2019-09-08 ENCOUNTER — Other Ambulatory Visit: Payer: Self-pay

## 2019-09-11 ENCOUNTER — Encounter: Payer: Self-pay | Admitting: Family Medicine

## 2019-09-11 ENCOUNTER — Ambulatory Visit: Payer: Medicare HMO | Admitting: Family Medicine

## 2019-09-11 ENCOUNTER — Ambulatory Visit (INDEPENDENT_AMBULATORY_CARE_PROVIDER_SITE_OTHER): Payer: Medicare HMO | Admitting: Family Medicine

## 2019-09-11 ENCOUNTER — Other Ambulatory Visit: Payer: Self-pay

## 2019-09-11 VITALS — BP 123/84 | HR 88 | Temp 95.0°F | Resp 16 | Ht 64.0 in | Wt 168.0 lb

## 2019-09-11 DIAGNOSIS — R6889 Other general symptoms and signs: Secondary | ICD-10-CM | POA: Diagnosis not present

## 2019-09-11 DIAGNOSIS — E785 Hyperlipidemia, unspecified: Secondary | ICD-10-CM | POA: Diagnosis not present

## 2019-09-11 DIAGNOSIS — E034 Atrophy of thyroid (acquired): Secondary | ICD-10-CM | POA: Diagnosis not present

## 2019-09-11 DIAGNOSIS — E1142 Type 2 diabetes mellitus with diabetic polyneuropathy: Secondary | ICD-10-CM | POA: Diagnosis not present

## 2019-09-11 DIAGNOSIS — N189 Chronic kidney disease, unspecified: Secondary | ICD-10-CM

## 2019-09-11 DIAGNOSIS — K219 Gastro-esophageal reflux disease without esophagitis: Secondary | ICD-10-CM | POA: Diagnosis not present

## 2019-09-11 DIAGNOSIS — I10 Essential (primary) hypertension: Secondary | ICD-10-CM | POA: Diagnosis not present

## 2019-09-11 MED ORDER — AMITRIPTYLINE HCL 100 MG PO TABS: ORAL_TABLET | ORAL | 1 refills | Status: DC

## 2019-09-11 MED ORDER — LISINOPRIL 20 MG PO TABS: 20.0000 mg | ORAL_TABLET | Freq: Every day | ORAL | 3 refills | Status: DC

## 2019-09-11 MED ORDER — AMITRIPTYLINE HCL 25 MG PO TABS: 25.0000 mg | ORAL_TABLET | Freq: Every day | ORAL | 1 refills | Status: DC

## 2019-09-11 MED ORDER — OMEPRAZOLE 20 MG PO CPDR: DELAYED_RELEASE_CAPSULE | ORAL | 1 refills | Status: DC

## 2019-09-11 NOTE — Progress Notes (Addendum)
[facility] [address] [place], [place] [address] [phone]  Date:  09/11/2019   Name:  Caroline Simmons   DOB:  09-17-1955   MRN:  [id number]  PCP:  Darreld Mclean, MD    Chief Complaint: Diabetes (6 month follow up)   History of Present Illness:  Caroline Simmons is a 64 y.o. very pleasant female patient who presents with the following:  Here today for periodic follow-up visit Patient with history of hypertension, diabetes with neuropathy and dyslipidemia, hypothyroidism, chronic kidney disease Last seen by myself in September She lost her mother last year after COVID-19, this has been very difficult for her  She is followed by nephrology, Dr. Audie Clear with [facility] his most recent note from December 1. Stage 3b chronic kidney disease  2. Type 2 diabetes mellitus with stage 3b chronic kidney disease, with long-term current use of insulin ([place])  3. Essential hypertension  4. Secondary hyperparathyroidism of renal origin ([place])   CKD stage III 2/2 DM, HTN with proteinuria. Baseline 1.4-1.6. Reports running out of lisinopril a month ago but has not contacted PCP for refills. Only taking amlodipine 2.5 mg daily -Creatinine 1.6 GFR 40 which is stable -Electrolytes acceptable -Blood pressure acceptable but needs to get back on lisinopril for renal protection and proteinuria. She will contact PCP for refills. Advised to request a 20 mg prescription so she would not have to cut it in half. (Prior issues with nausea and lightheadedness on higher dose) -Diabetes well controlled with A1c of 6.8 Return in about 4 months (around 09/21/2019) for CKD.  Lab Results  Component Value Date   HGBA1C 6.8 (H) 03/06/2019    Today needs foot exam-she does have neuropathy, takes amitriptyline 100 at bedtime and 20 5 in the morning A1c is due Covid series- she would like to do this eventually, we went over how to obtain this  vaccine through [facility]  Amlodipine 2.5 Amitriptyline at bedtime Aspirin 81 Tegretol Synthroid Lisinopril 20 Crestor  Last fall she had complained to me about feeling "like I might pass out" especially during exercise.  She denies any chest pain or shortness of breath.  EKG at that time was normal, she declined to have further testing or see cardiology She brings of this concern again today, no change.  She is now willing to have a cardiology consultation Patient Active Problem List   Diagnosis Date Noted  . Chronic kidney disease, stage 3 04/08/2016  . Dyslipidemia associated with type 2 diabetes mellitus ([place]) 05/30/2013  . Detached retina, left 11/25/2011  . Depression with anxiety 11/25/2011  . Neuropathy in diabetes ([place]) 09/15/2011  . Insomnia, unspecified   . Hypothyroidism   . DIABETES MELLITUS, TYPE II 08/26/2010  . HYPERTENSION 08/26/2010  . RECTAL PAIN 08/26/2010    Past Medical History:  Diagnosis Date  . Depression   . Essential hypertension, benign   . Hypothyroid   . Insomnia, unspecified   . Menopause    lmp 06/2007  . Type II or unspecified type diabetes mellitus without mention of complication, not stated as uncontrolled     Past Surgical History:  Procedure Laterality Date  . [place]  . EYE SURGERY    . RETINAL DETACHMENT REPAIR W/ SCLERAL BUCKLE LE  11/12/2011    Social History   Tobacco Use  . Smoking status: Former Smoker    Packs/day: 1.00  Years: 3.00    Pack years: 3.00    Types: Cigarettes  . Smokeless tobacco: Never Used  . Tobacco comment: quit 08/2003  Substance Use Topics  . Alcohol use: Yes    Comment: rare-beer or wine  . Drug use: No    Family History  Problem Relation Age of Onset  . Diabetes Mother        type 2  not sure onset  . Diabetes Other   . Hypertension Other     Allergies  Allergen Reactions  . Codeine Nausea Only    Pt states she has taken this with no problems.   . Zoloft  [Sertraline Hcl]   . Lamotrigine Rash    Other reaction(s): RASH    Medication list has been reviewed and updated.  Current Outpatient Medications on File Prior to Visit  Medication Sig Dispense Refill  . ACCU-CHEK AVIVA PLUS test strip USE  1  TEST  STRIP EVERY DAY 100 each 5  . Alcohol Swabs (B-D SINGLE USE SWABS REGULAR) PADS USE  1  SWAB EVERY DAY 100 each 3  . amitriptyline (ELAVIL) 100 MG tablet TAKE 1 TABLET BY MOUTH EVERYDAY AT BEDTIME 90 tablet 1  . amitriptyline (ELAVIL) 25 MG tablet TAKE 1 TABLET BY MOUTH EVERY DAY 90 tablet 1  . amLODipine (NORVASC) 2.5 MG tablet Take 2.5 mg by mouth daily.    Marland Kitchen aspirin 81 MG tablet Take 81 mg by mouth daily.    . carbamazepine (TEGRETOL) 200 MG tablet TAKE 2 TABLETS BY MOUTH EVERY MORNING AND 1 TABLET IN THE EVENING 270 tablet 3  . levothyroxine (SYNTHROID) 50 MCG tablet TAKE 1 TABLET BY MOUTH EVERY DAY 90 tablet 2  . omeprazole (PRILOSEC) 20 MG capsule TAKE 2 CAPSULES ONCE A DAY FOR NAUSEA AND REFLUX. USE AS NEEDED- DOES NOT HAVE TO TAKE EVERY DAY 180 capsule 1  . rosuvastatin (CRESTOR) 20 MG tablet TAKE 1 TABLET BY MOUTH EVERY DAY 90 tablet 1  . terbinafine (LAMISIL) 250 MG tablet TAKE 1 TABLET BY MOUTH EVERY DAY FOR 12 WEEKS 30 tablet 2   No current facility-administered medications on file prior to visit.    Review of Systems:  As per HPI- otherwise negative.   Physical Examination: Vitals:   09/11/19 1528  BP: 123/84  Pulse: 88  Resp: 16  Temp: (!) 95 F (35 C)  SpO2: 98%   Vitals:   09/11/19 1528  Weight: 168 lb (76.2 kg)  Height: 5\' 4"  (1.626 m)   Body mass index is 28.84 kg/m. Ideal Body Weight: Weight in (lb) to have BMI = 25: 145.3  GEN: no acute distress.  Mild overweight, looks well.  Patient is somewhat difficult to understand as per her baseline HEENT: Atraumatic, Normocephalic.  Ears and Nose: No external deformity. CV: RRR, No M/G/R. No JVD. No thrill. No extra heart sounds. PULM: CTA B, no  wheezes, crackles, rhonchi. No retractions. No resp. distress. No accessory muscle use. ABD: S, NT, ND, +BS. No rebound. No HSM. EXTR: No c/c/e PSYCH: Normally interactive. Conversant.  Foot exam; normal pulses and visual exam, she cannot sense monofilament sites on either foot  Assessment and Plan: Dyslipidemia - Plan: Lipid panel  Hypothyroidism due to acquired atrophy of thyroid - Plan: TSH  Controlled type 2 diabetes mellitus with diabetic polyneuropathy, without long-term current use of insulin (HCC) - Plan: Comprehensive metabolic panel, Hemoglobin A1c, amitriptyline (ELAVIL) 25 MG tablet, amitriptyline (ELAVIL) 100 MG tablet  Chronic kidney disease, unspecified  CKD stage - Plan: Comprehensive metabolic panel  Essential hypertension - Plan: CBC, Comprehensive metabolic panel, lisinopril (ZESTRIL) 20 MG tablet  Gastroesophageal reflux disease - Plan: omeprazole (PRILOSEC) 20 MG capsule  Exercise intolerance - Plan: Ambulatory referral to Cardiology  Here today for a follow-up visit Labs pending as above Blood pressure under okay control She takes omeprazole as needed for reflux, needs a refill Unfortunately she does have symptomatic neuropathy, her symptoms are improved with amitriptyline.  I refill this for her today Referral to cardiology to evaluate a sensation of presyncope with exercise which has been present and stable for several months Moderate medical decision making today Will plan further follow- up pending labs.  This visit occurred during the SARS-CoV-2 public health emergency.  Safety protocols were in place, including screening questions prior to the visit, additional usage of staff PPE, and extensive cleaning of exam room while observing appropriate contact time as indicated for disinfecting solutions.    Signed Lamar Blinks, MD   Received her labs 3/23- letter to pt  Results for orders placed or performed in visit on 09/11/19  CBC  Result Value Ref  Range   WBC 6.4 4.0 - 10.5 K/uL   RBC 4.01 3.87 - 5.11 Mil/uL   Platelets 231.0 150.0 - 400.0 K/uL   Hemoglobin 12.5 12.0 - 15.0 g/dL   HCT 37.1 36.0 - 46.0 %   MCV 92.4 78.0 - 100.0 fl   MCHC 33.7 30.0 - 36.0 g/dL   RDW 13.3 11.5 - 15.5 %  Comprehensive metabolic panel  Result Value Ref Range   Sodium 140 135 - 145 mEq/L   Potassium 4.4 3.5 - 5.1 mEq/L   Chloride 104 96 - 112 mEq/L   CO2 28 19 - 32 mEq/L   Glucose, Bld 140 (H) 70 - 99 mg/dL   BUN 27 (H) 6 - 23 mg/dL   Creatinine, Ser 1.83 (H) 0.40 - 1.20 mg/dL   Total Bilirubin 0.3 0.2 - 1.2 mg/dL   Alkaline Phosphatase 71 39 - 117 U/L   AST 14 0 - 37 U/L   ALT 14 0 - 35 U/L   Total Protein 6.8 6.0 - 8.3 g/dL   Albumin 4.6 3.5 - 5.2 g/dL   GFR 33.73 (L) >60.00 mL/min   Calcium 9.4 8.4 - 10.5 mg/dL  Hemoglobin A1c  Result Value Ref Range   Hgb A1c MFr Bld 6.5 4.6 - 6.5 %  Lipid panel  Result Value Ref Range   Cholesterol 179 0 - 200 mg/dL   Triglycerides 226.0 (H) 0.0 - 149.0 mg/dL   HDL 53.30 >39.00 mg/dL   VLDL 45.2 (H) 0.0 - 40.0 mg/dL   Total CHOL/HDL Ratio 3    NonHDL 125.45   TSH  Result Value Ref Range   TSH 1.29 0.35 - 4.50 uIU/mL  LDL cholesterol, direct  Result Value Ref Range   Direct LDL 78.0 mg/dL

## 2019-09-11 NOTE — Patient Instructions (Addendum)
It was good to see you again today, I will be in touch with your labs as soon as possible  Assuming all is okay, we can plan to visit in 6 months I refilled your medications as requested today.  We will set you up to see cardiology to discuss your feeling like you might pass out - they may want to do a more advanced test to check on your heart  Please get your covid vaccine asap!

## 2019-09-12 LAB — LIPID PANEL
Cholesterol: 179 mg/dL (ref 0–200)
HDL: 53.3 mg/dL (ref >39.00)
NonHDL: 125.45
Total CHOL/HDL Ratio: 3
Triglycerides: 226 mg/dL — ABNORMAL HIGH (ref 0.0–149.0)
VLDL: 45.2 mg/dL — ABNORMAL HIGH (ref 0.0–40.0)

## 2019-09-12 LAB — CBC
HCT: 37.1 % (ref 36.0–46.0)
Hemoglobin: 12.5 g/dL (ref 12.0–15.0)
MCHC: 33.7 g/dL (ref 30.0–36.0)
MCV: 92.4 fl (ref 78.0–100.0)
Platelets: 231 10*3/uL (ref 150.0–400.0)
RBC: 4.01 Mil/uL (ref 3.87–5.11)
RDW: 13.3 % (ref 11.5–15.5)
WBC: 6.4 10*3/uL (ref 4.0–10.5)

## 2019-09-12 LAB — COMPREHENSIVE METABOLIC PANEL
ALT: 14 U/L (ref 0–35)
AST: 14 U/L (ref 0–37)
Albumin: 4.6 g/dL (ref 3.5–5.2)
Alkaline Phosphatase: 71 U/L (ref 39–117)
BUN: 27 mg/dL — ABNORMAL HIGH (ref 6–23)
CO2: 28 mEq/L (ref 19–32)
Calcium: 9.4 mg/dL (ref 8.4–10.5)
Chloride: 104 mEq/L (ref 96–112)
Creatinine, Ser: 1.83 mg/dL — ABNORMAL HIGH (ref 0.40–1.20)
GFR: 33.73 mL/min — ABNORMAL LOW (ref >60.00)
Glucose, Bld: 140 mg/dL — ABNORMAL HIGH (ref 70–99)
Potassium: 4.4 mEq/L (ref 3.5–5.1)
Sodium: 140 mEq/L (ref 135–145)
Total Bilirubin: 0.3 mg/dL (ref 0.2–1.2)
Total Protein: 6.8 g/dL (ref 6.0–8.3)

## 2019-09-12 LAB — LDL CHOLESTEROL, DIRECT: Direct LDL: 78 mg/dL

## 2019-09-12 LAB — TSH: TSH: 1.29 u[IU]/mL (ref 0.35–4.50)

## 2019-09-12 LAB — HEMOGLOBIN A1C: Hgb A1c MFr Bld: 6.5 % (ref 4.6–6.5)

## 2019-09-14 ENCOUNTER — Other Ambulatory Visit: Payer: Self-pay

## 2019-09-14 ENCOUNTER — Telehealth (INDEPENDENT_AMBULATORY_CARE_PROVIDER_SITE_OTHER): Payer: Medicare HMO | Admitting: Neurology

## 2019-09-14 ENCOUNTER — Encounter: Payer: Self-pay | Admitting: Neurology

## 2019-09-14 VITALS — Ht 64.0 in | Wt 168.0 lb

## 2019-09-14 DIAGNOSIS — E1140 Type 2 diabetes mellitus with diabetic neuropathy, unspecified: Secondary | ICD-10-CM | POA: Diagnosis not present

## 2019-09-14 DIAGNOSIS — R8290 Unspecified abnormal findings in urine: Secondary | ICD-10-CM | POA: Insufficient documentation

## 2019-09-14 DIAGNOSIS — G6289 Other specified polyneuropathies: Secondary | ICD-10-CM

## 2019-09-14 DIAGNOSIS — R2689 Other abnormalities of gait and mobility: Secondary | ICD-10-CM

## 2019-09-14 MED ORDER — CARBAMAZEPINE 200 MG PO TABS: ORAL_TABLET | ORAL | 3 refills | Status: DC

## 2019-09-14 NOTE — Progress Notes (Signed)
   Due to the COVID-19 crisis, this virtual check-in visit was done via telephone from my office and it was initiated and consent given by this patient and or family.  Telephone (Audio) Visit The purpose of this telephone visit is to provide medical care while limiting exposure to the novel coronavirus.    Consent was obtained for telephone visit and initiated by pt/family:  Yes.   Answered questions that patient had about telehealth interaction:  Yes.   I discussed the limitations, risks, security and privacy concerns of performing an evaluation and management service by telephone. I also discussed with the patient that there may be a patient responsible charge related to this service. The patient expressed understanding and agreed to proceed.  Pt location: Home Physician Location: office Name of referring provider:  Copland, Gay Filler, MD I connected with .Sanda Klein at patients initiation/request on 09/14/2019 at 10:10 AM EDT by telephone and verified that I am speaking with the correct person using two identifiers.  Pt MRN:  [id number] Pt DOB:  1956-06-03  Assessment/Plan:  Distal and symmetric painful diabetic neuropathy, likely small fiber as prior EDX was normal Previously tried: Lyrica, gabapentin, Cymbalta, Lamictal, and Dilantin.   Continue nortriptyline 25mg  in the morning and 100mg  at bedtime Continue Tegretol 400mg  in the morning and 200mg  at bedtime Start home physical therapy for gait training - she does not drive Patient educated on daily foot inspection, fall prevention, and safety precautions around the home.    Subjective:   Her pain is well-controlled on combination of nortriptyline 25mg  in the morning and 100mg  at bedtime + Tegretol 400mg  in the morning and 200mg  at bedtime.  She is due for refills.  Overall, her neuropathy has been stable.  She has noticed increased imbalance and is very cautious when walking.  Her mother and another family member recently passed  from [place].  She has not received tha vaccination yet, but was provided with details to schedule vaccination from her PCP.    Objective:   Vitals:   09/14/19 0803  Weight: 168 lb (76.2 kg)  Height: 5\' 4"  (1.626 m)     Follow Up Instructions:     I discussed the assessment and treatment plan with the patient. The patient was provided an opportunity to ask questions and all were answered. The patient agreed with the plan and demonstrated an understanding of the instructions.   The patient was advised to call back or seek an in-person evaluation if the symptoms worsen or if the condition fails to improve as anticipated.    Total Time spent in visit with the patient was:  12 min, of which 100% of the time was spent in counseling and/or coordinating care.   Pt understands and agrees with the plan of care outlined.     Alda Berthold, DO

## 2019-09-14 NOTE — Progress Notes (Signed)
Ordered physical therapy,

## 2019-09-18 ENCOUNTER — Other Ambulatory Visit: Payer: Self-pay | Admitting: Family Medicine

## 2019-09-18 ENCOUNTER — Ambulatory Visit: Payer: Medicare HMO | Attending: Internal Medicine

## 2019-09-18 DIAGNOSIS — E785 Hyperlipidemia, unspecified: Secondary | ICD-10-CM

## 2019-09-18 DIAGNOSIS — Z23 Encounter for immunization: Secondary | ICD-10-CM

## 2019-09-18 NOTE — Progress Notes (Signed)
   Covid-19 Vaccination Clinic  Name:  Caroline Simmons    MRN: [id number] DOB: 11-02-1955  09/18/2019  [name] was observed post Covid-19 immunization for 15 minutes without incident. She was provided with Vaccine Information Sheet and instruction to access the V-Safe system.   [name] was instructed to call 911 with any severe reactions post vaccine: Marland Kitchen Difficulty breathing  . Swelling of face and throat  . A fast heartbeat  . A bad rash all over body  . Dizziness and weakness   Immunizations Administered    Name Date Dose VIS Date Route   Pfizer COVID-19 Vaccine 09/18/2019  5:02 PM 0.3 mL 06/02/2019 Intramuscular   Manufacturer: [place]   Lot: [id number]   [employer]: [id number]

## 2019-09-19 ENCOUNTER — Telehealth: Payer: Self-pay

## 2019-09-19 DIAGNOSIS — E1142 Type 2 diabetes mellitus with diabetic polyneuropathy: Secondary | ICD-10-CM | POA: Diagnosis not present

## 2019-09-19 DIAGNOSIS — E785 Hyperlipidemia, unspecified: Secondary | ICD-10-CM | POA: Diagnosis not present

## 2019-09-19 DIAGNOSIS — F418 Other specified anxiety disorders: Secondary | ICD-10-CM | POA: Diagnosis not present

## 2019-09-19 DIAGNOSIS — N183 Chronic kidney disease, stage 3 unspecified: Secondary | ICD-10-CM | POA: Diagnosis not present

## 2019-09-19 DIAGNOSIS — E1169 Type 2 diabetes mellitus with other specified complication: Secondary | ICD-10-CM | POA: Diagnosis not present

## 2019-09-19 DIAGNOSIS — I129 Hypertensive chronic kidney disease with stage 1 through stage 4 chronic kidney disease, or unspecified chronic kidney disease: Secondary | ICD-10-CM | POA: Diagnosis not present

## 2019-09-19 DIAGNOSIS — R296 Repeated falls: Secondary | ICD-10-CM | POA: Diagnosis not present

## 2019-09-19 DIAGNOSIS — E1122 Type 2 diabetes mellitus with diabetic chronic kidney disease: Secondary | ICD-10-CM | POA: Diagnosis not present

## 2019-09-19 DIAGNOSIS — E039 Hypothyroidism, unspecified: Secondary | ICD-10-CM | POA: Diagnosis not present

## 2019-09-19 NOTE — Telephone Encounter (Signed)
Verbal request for home health pt 2x a week for 4 weeks then 1x a week for 2 weeks.  Ok given.

## 2019-09-25 ENCOUNTER — Telehealth: Payer: Self-pay | Admitting: Family Medicine

## 2019-09-25 NOTE — Telephone Encounter (Signed)
Pt states that she got a call form her insurance this morning stating they are denied her [place] because she it wasn't referred by Copland. Pt is requesting for a callback to be advised on what to do.

## 2019-09-25 NOTE — Telephone Encounter (Signed)
I am not sure what she needs home health to do- can you please call her and ask for details?  I am glad to place referral but I am not sure of her needs

## 2019-09-25 NOTE — Telephone Encounter (Signed)
Can you refer patient for home health so insurance approves?

## 2019-09-26 DIAGNOSIS — N1832 Chronic kidney disease, stage 3b: Secondary | ICD-10-CM | POA: Diagnosis not present

## 2019-09-27 NOTE — Telephone Encounter (Signed)
Called patient she states Dr. Posey Pronto advised her to have home health come to her house for physical therapy to help with neuropathy in her feet. Neuropathy bilateral feet. Without a referral for home health medicare will not cover.

## 2019-09-27 NOTE — Telephone Encounter (Signed)
I believe neurology wanted her to have physical therapy for balance training-I agree this is a good idea, but I am not sure if she will qualify as homebound to get in-home services.  I will touch base with Dr. Posey Pronto

## 2019-09-28 DIAGNOSIS — I129 Hypertensive chronic kidney disease with stage 1 through stage 4 chronic kidney disease, or unspecified chronic kidney disease: Secondary | ICD-10-CM | POA: Diagnosis not present

## 2019-09-28 DIAGNOSIS — N1832 Chronic kidney disease, stage 3b: Secondary | ICD-10-CM | POA: Diagnosis not present

## 2019-09-28 DIAGNOSIS — E1122 Type 2 diabetes mellitus with diabetic chronic kidney disease: Secondary | ICD-10-CM | POA: Diagnosis not present

## 2019-09-28 DIAGNOSIS — E1121 Type 2 diabetes mellitus with diabetic nephropathy: Secondary | ICD-10-CM | POA: Diagnosis not present

## 2019-09-28 DIAGNOSIS — R809 Proteinuria, unspecified: Secondary | ICD-10-CM | POA: Diagnosis not present

## 2019-09-28 DIAGNOSIS — Z87891 Personal history of nicotine dependence: Secondary | ICD-10-CM | POA: Diagnosis not present

## 2019-09-28 DIAGNOSIS — Z794 Long term (current) use of insulin: Secondary | ICD-10-CM | POA: Diagnosis not present

## 2019-09-28 DIAGNOSIS — N2581 Secondary hyperparathyroidism of renal origin: Secondary | ICD-10-CM | POA: Diagnosis not present

## 2019-09-29 ENCOUNTER — Other Ambulatory Visit: Payer: Self-pay | Admitting: Family Medicine

## 2019-09-29 DIAGNOSIS — R269 Unspecified abnormalities of gait and mobility: Secondary | ICD-10-CM

## 2019-09-29 DIAGNOSIS — E1142 Type 2 diabetes mellitus with diabetic polyneuropathy: Secondary | ICD-10-CM

## 2019-10-03 ENCOUNTER — Telehealth: Payer: Self-pay | Admitting: Family Medicine

## 2019-10-03 DIAGNOSIS — N1832 Chronic kidney disease, stage 3b: Secondary | ICD-10-CM | POA: Diagnosis not present

## 2019-10-03 DIAGNOSIS — N2581 Secondary hyperparathyroidism of renal origin: Secondary | ICD-10-CM | POA: Diagnosis not present

## 2019-10-03 DIAGNOSIS — E785 Hyperlipidemia, unspecified: Secondary | ICD-10-CM | POA: Diagnosis not present

## 2019-10-03 DIAGNOSIS — E1122 Type 2 diabetes mellitus with diabetic chronic kidney disease: Secondary | ICD-10-CM | POA: Diagnosis not present

## 2019-10-03 DIAGNOSIS — E1142 Type 2 diabetes mellitus with diabetic polyneuropathy: Secondary | ICD-10-CM | POA: Diagnosis not present

## 2019-10-03 DIAGNOSIS — I129 Hypertensive chronic kidney disease with stage 1 through stage 4 chronic kidney disease, or unspecified chronic kidney disease: Secondary | ICD-10-CM | POA: Diagnosis not present

## 2019-10-03 DIAGNOSIS — F418 Other specified anxiety disorders: Secondary | ICD-10-CM | POA: Diagnosis not present

## 2019-10-03 DIAGNOSIS — E1169 Type 2 diabetes mellitus with other specified complication: Secondary | ICD-10-CM | POA: Diagnosis not present

## 2019-10-03 DIAGNOSIS — G4700 Insomnia, unspecified: Secondary | ICD-10-CM | POA: Diagnosis not present

## 2019-10-03 NOTE — Telephone Encounter (Signed)
  Caller :[facility]  Call Back # [phone]  Subject : [place]   Verbal Order Requesting  Requesting to see patient :  2 visits a  week for 3wks 1 visit  For  2weeks

## 2019-10-04 NOTE — Telephone Encounter (Signed)
Verbal orders given to Scott. 

## 2019-10-05 DIAGNOSIS — E1142 Type 2 diabetes mellitus with diabetic polyneuropathy: Secondary | ICD-10-CM | POA: Diagnosis not present

## 2019-10-05 DIAGNOSIS — I129 Hypertensive chronic kidney disease with stage 1 through stage 4 chronic kidney disease, or unspecified chronic kidney disease: Secondary | ICD-10-CM | POA: Diagnosis not present

## 2019-10-05 DIAGNOSIS — R296 Repeated falls: Secondary | ICD-10-CM | POA: Diagnosis not present

## 2019-10-05 DIAGNOSIS — E1122 Type 2 diabetes mellitus with diabetic chronic kidney disease: Secondary | ICD-10-CM | POA: Diagnosis not present

## 2019-10-05 DIAGNOSIS — F418 Other specified anxiety disorders: Secondary | ICD-10-CM | POA: Diagnosis not present

## 2019-10-05 DIAGNOSIS — E1169 Type 2 diabetes mellitus with other specified complication: Secondary | ICD-10-CM | POA: Diagnosis not present

## 2019-10-05 DIAGNOSIS — E785 Hyperlipidemia, unspecified: Secondary | ICD-10-CM | POA: Diagnosis not present

## 2019-10-05 DIAGNOSIS — N183 Chronic kidney disease, stage 3 unspecified: Secondary | ICD-10-CM | POA: Diagnosis not present

## 2019-10-05 DIAGNOSIS — E039 Hypothyroidism, unspecified: Secondary | ICD-10-CM | POA: Diagnosis not present

## 2019-10-06 DIAGNOSIS — N183 Chronic kidney disease, stage 3 unspecified: Secondary | ICD-10-CM | POA: Diagnosis not present

## 2019-10-06 DIAGNOSIS — F418 Other specified anxiety disorders: Secondary | ICD-10-CM | POA: Diagnosis not present

## 2019-10-06 DIAGNOSIS — I129 Hypertensive chronic kidney disease with stage 1 through stage 4 chronic kidney disease, or unspecified chronic kidney disease: Secondary | ICD-10-CM | POA: Diagnosis not present

## 2019-10-06 DIAGNOSIS — E1169 Type 2 diabetes mellitus with other specified complication: Secondary | ICD-10-CM | POA: Diagnosis not present

## 2019-10-06 DIAGNOSIS — E039 Hypothyroidism, unspecified: Secondary | ICD-10-CM | POA: Diagnosis not present

## 2019-10-06 DIAGNOSIS — E1142 Type 2 diabetes mellitus with diabetic polyneuropathy: Secondary | ICD-10-CM | POA: Diagnosis not present

## 2019-10-06 DIAGNOSIS — E1122 Type 2 diabetes mellitus with diabetic chronic kidney disease: Secondary | ICD-10-CM | POA: Diagnosis not present

## 2019-10-06 DIAGNOSIS — E785 Hyperlipidemia, unspecified: Secondary | ICD-10-CM | POA: Diagnosis not present

## 2019-10-06 DIAGNOSIS — R296 Repeated falls: Secondary | ICD-10-CM | POA: Diagnosis not present

## 2019-10-09 DIAGNOSIS — E1122 Type 2 diabetes mellitus with diabetic chronic kidney disease: Secondary | ICD-10-CM | POA: Diagnosis not present

## 2019-10-09 DIAGNOSIS — E785 Hyperlipidemia, unspecified: Secondary | ICD-10-CM | POA: Diagnosis not present

## 2019-10-09 DIAGNOSIS — R296 Repeated falls: Secondary | ICD-10-CM | POA: Diagnosis not present

## 2019-10-09 DIAGNOSIS — N183 Chronic kidney disease, stage 3 unspecified: Secondary | ICD-10-CM | POA: Diagnosis not present

## 2019-10-09 DIAGNOSIS — E039 Hypothyroidism, unspecified: Secondary | ICD-10-CM | POA: Diagnosis not present

## 2019-10-09 DIAGNOSIS — N2581 Secondary hyperparathyroidism of renal origin: Secondary | ICD-10-CM | POA: Diagnosis not present

## 2019-10-09 DIAGNOSIS — G4700 Insomnia, unspecified: Secondary | ICD-10-CM | POA: Diagnosis not present

## 2019-10-09 DIAGNOSIS — N1832 Chronic kidney disease, stage 3b: Secondary | ICD-10-CM | POA: Diagnosis not present

## 2019-10-09 DIAGNOSIS — I129 Hypertensive chronic kidney disease with stage 1 through stage 4 chronic kidney disease, or unspecified chronic kidney disease: Secondary | ICD-10-CM | POA: Diagnosis not present

## 2019-10-09 DIAGNOSIS — F418 Other specified anxiety disorders: Secondary | ICD-10-CM | POA: Diagnosis not present

## 2019-10-09 DIAGNOSIS — E1142 Type 2 diabetes mellitus with diabetic polyneuropathy: Secondary | ICD-10-CM | POA: Diagnosis not present

## 2019-10-09 DIAGNOSIS — E1169 Type 2 diabetes mellitus with other specified complication: Secondary | ICD-10-CM | POA: Diagnosis not present

## 2019-10-11 ENCOUNTER — Ambulatory Visit: Payer: Medicare HMO | Attending: Internal Medicine

## 2019-10-11 DIAGNOSIS — Z23 Encounter for immunization: Secondary | ICD-10-CM

## 2019-10-11 NOTE — Progress Notes (Signed)
   Covid-19 Vaccination Clinic  Name:  Caroline Simmons    MRN: [id number] DOB: 10/09/1955  10/11/2019  Caroline Simmons was observed post Covid-19 immunization for 15 minutes without incident. She was provided with Vaccine Information Sheet and instruction to access the V-Safe system.   Caroline Simmons was instructed to call 911 with any severe reactions post vaccine: Marland Kitchen Difficulty breathing  . Swelling of face and throat  . A fast heartbeat  . A bad rash all over body  . Dizziness and weakness   Immunizations Administered    Name Date Dose VIS Date Route   Pfizer COVID-19 Vaccine 10/11/2019  2:37 PM 0.3 mL 08/16/2018 Intramuscular   Manufacturer: [place]   Lot: [id number]   [place]: [id number]

## 2019-10-13 ENCOUNTER — Telehealth: Payer: Self-pay | Admitting: Neurology

## 2019-10-13 NOTE — Telephone Encounter (Signed)
Liji from [facility] called requesting verbal orders to reschedule the patient's appointment for today, the patient refused service today.

## 2019-10-13 NOTE — Telephone Encounter (Signed)
OK to reschedule visit.

## 2019-10-13 NOTE — Telephone Encounter (Signed)
Liji from [facility] called and was given verbal orders per Dr Posey Pronto  to reschedule the patient's appointment for today, the patient refused service today.

## 2019-10-16 DIAGNOSIS — E1169 Type 2 diabetes mellitus with other specified complication: Secondary | ICD-10-CM | POA: Diagnosis not present

## 2019-10-16 DIAGNOSIS — E039 Hypothyroidism, unspecified: Secondary | ICD-10-CM | POA: Diagnosis not present

## 2019-10-16 DIAGNOSIS — F418 Other specified anxiety disorders: Secondary | ICD-10-CM | POA: Diagnosis not present

## 2019-10-16 DIAGNOSIS — E1122 Type 2 diabetes mellitus with diabetic chronic kidney disease: Secondary | ICD-10-CM | POA: Diagnosis not present

## 2019-10-16 DIAGNOSIS — N1832 Chronic kidney disease, stage 3b: Secondary | ICD-10-CM | POA: Diagnosis not present

## 2019-10-16 DIAGNOSIS — E785 Hyperlipidemia, unspecified: Secondary | ICD-10-CM | POA: Diagnosis not present

## 2019-10-16 DIAGNOSIS — E1142 Type 2 diabetes mellitus with diabetic polyneuropathy: Secondary | ICD-10-CM | POA: Diagnosis not present

## 2019-10-16 DIAGNOSIS — N2581 Secondary hyperparathyroidism of renal origin: Secondary | ICD-10-CM | POA: Diagnosis not present

## 2019-10-16 DIAGNOSIS — G4700 Insomnia, unspecified: Secondary | ICD-10-CM | POA: Diagnosis not present

## 2019-10-16 DIAGNOSIS — R296 Repeated falls: Secondary | ICD-10-CM | POA: Diagnosis not present

## 2019-10-16 DIAGNOSIS — I129 Hypertensive chronic kidney disease with stage 1 through stage 4 chronic kidney disease, or unspecified chronic kidney disease: Secondary | ICD-10-CM | POA: Diagnosis not present

## 2019-10-16 DIAGNOSIS — N183 Chronic kidney disease, stage 3 unspecified: Secondary | ICD-10-CM | POA: Diagnosis not present

## 2019-10-26 DIAGNOSIS — I129 Hypertensive chronic kidney disease with stage 1 through stage 4 chronic kidney disease, or unspecified chronic kidney disease: Secondary | ICD-10-CM | POA: Diagnosis not present

## 2019-10-26 DIAGNOSIS — N183 Chronic kidney disease, stage 3 unspecified: Secondary | ICD-10-CM | POA: Diagnosis not present

## 2019-10-26 DIAGNOSIS — F418 Other specified anxiety disorders: Secondary | ICD-10-CM | POA: Diagnosis not present

## 2019-10-26 DIAGNOSIS — R296 Repeated falls: Secondary | ICD-10-CM | POA: Diagnosis not present

## 2019-10-26 DIAGNOSIS — E1169 Type 2 diabetes mellitus with other specified complication: Secondary | ICD-10-CM | POA: Diagnosis not present

## 2019-10-26 DIAGNOSIS — E785 Hyperlipidemia, unspecified: Secondary | ICD-10-CM | POA: Diagnosis not present

## 2019-10-26 DIAGNOSIS — E1142 Type 2 diabetes mellitus with diabetic polyneuropathy: Secondary | ICD-10-CM | POA: Diagnosis not present

## 2019-10-26 DIAGNOSIS — E039 Hypothyroidism, unspecified: Secondary | ICD-10-CM | POA: Diagnosis not present

## 2019-10-26 DIAGNOSIS — E1122 Type 2 diabetes mellitus with diabetic chronic kidney disease: Secondary | ICD-10-CM | POA: Diagnosis not present

## 2019-10-27 DIAGNOSIS — F418 Other specified anxiety disorders: Secondary | ICD-10-CM | POA: Diagnosis not present

## 2019-10-27 DIAGNOSIS — E039 Hypothyroidism, unspecified: Secondary | ICD-10-CM | POA: Diagnosis not present

## 2019-10-27 DIAGNOSIS — R296 Repeated falls: Secondary | ICD-10-CM | POA: Diagnosis not present

## 2019-10-27 DIAGNOSIS — N183 Chronic kidney disease, stage 3 unspecified: Secondary | ICD-10-CM | POA: Diagnosis not present

## 2019-10-27 DIAGNOSIS — E1122 Type 2 diabetes mellitus with diabetic chronic kidney disease: Secondary | ICD-10-CM | POA: Diagnosis not present

## 2019-10-27 DIAGNOSIS — E1169 Type 2 diabetes mellitus with other specified complication: Secondary | ICD-10-CM | POA: Diagnosis not present

## 2019-10-27 DIAGNOSIS — E1142 Type 2 diabetes mellitus with diabetic polyneuropathy: Secondary | ICD-10-CM | POA: Diagnosis not present

## 2019-10-27 DIAGNOSIS — E785 Hyperlipidemia, unspecified: Secondary | ICD-10-CM | POA: Diagnosis not present

## 2019-10-27 DIAGNOSIS — I129 Hypertensive chronic kidney disease with stage 1 through stage 4 chronic kidney disease, or unspecified chronic kidney disease: Secondary | ICD-10-CM | POA: Diagnosis not present

## 2019-10-31 DIAGNOSIS — E1142 Type 2 diabetes mellitus with diabetic polyneuropathy: Secondary | ICD-10-CM | POA: Diagnosis not present

## 2019-10-31 DIAGNOSIS — E1122 Type 2 diabetes mellitus with diabetic chronic kidney disease: Secondary | ICD-10-CM | POA: Diagnosis not present

## 2019-10-31 DIAGNOSIS — E785 Hyperlipidemia, unspecified: Secondary | ICD-10-CM | POA: Diagnosis not present

## 2019-10-31 DIAGNOSIS — R296 Repeated falls: Secondary | ICD-10-CM | POA: Diagnosis not present

## 2019-10-31 DIAGNOSIS — N183 Chronic kidney disease, stage 3 unspecified: Secondary | ICD-10-CM | POA: Diagnosis not present

## 2019-10-31 DIAGNOSIS — I129 Hypertensive chronic kidney disease with stage 1 through stage 4 chronic kidney disease, or unspecified chronic kidney disease: Secondary | ICD-10-CM | POA: Diagnosis not present

## 2019-10-31 DIAGNOSIS — E1169 Type 2 diabetes mellitus with other specified complication: Secondary | ICD-10-CM | POA: Diagnosis not present

## 2019-10-31 DIAGNOSIS — E039 Hypothyroidism, unspecified: Secondary | ICD-10-CM | POA: Diagnosis not present

## 2019-10-31 DIAGNOSIS — F418 Other specified anxiety disorders: Secondary | ICD-10-CM | POA: Diagnosis not present

## 2019-11-10 NOTE — Progress Notes (Signed)
Referring-Jessica Copland, MD Reason for referral-dyspnea and fatigue  HPI: 64 year old female for evaluation of dyspnea and fatigue at request of Lamar Blinks, MD. Labs 3/21 showed normal TSH, BUN 27 and Cr 1.83; Hgb 12.5.  Patient states that she has had dyspnea on exertion and fatigue with activities for the past 2 years.  When she climbs stairs she gets significantly dyspneic and dizzy.  She denies orthopnea, PND, pedal edema, chest pain or syncope.  She has occasional palpitations described as her heart racing for 2 minutes.  Cardiology asked to evaluate.  Current Outpatient Medications  Medication Sig Dispense Refill  . ACCU-CHEK AVIVA PLUS test strip USE  1  TEST  STRIP EVERY DAY 100 each 5  . Alcohol Swabs (B-D SINGLE USE SWABS REGULAR) PADS USE  1  SWAB EVERY DAY 100 each 3  . amitriptyline (ELAVIL) 100 MG tablet TAKE 1 TABLET BY MOUTH EVERYDAY AT BEDTIME 90 tablet 1  . amitriptyline (ELAVIL) 25 MG tablet Take 1 tablet (25 mg total) by mouth daily. 90 tablet 1  . amLODipine (NORVASC) 2.5 MG tablet Take 2.5 mg by mouth daily.    Marland Kitchen aspirin 81 MG tablet Take 81 mg by mouth daily.    . carbamazepine (TEGRETOL) 200 MG tablet TAKE 2 TABLETS BY MOUTH EVERY MORNING AND 1 TABLET IN THE EVENING 270 tablet 3  . levothyroxine (SYNTHROID) 50 MCG tablet TAKE 1 TABLET BY MOUTH EVERY DAY 90 tablet 2  . lisinopril (ZESTRIL) 20 MG tablet Take 1 tablet (20 mg total) by mouth daily. 90 tablet 3  . omeprazole (PRILOSEC) 20 MG capsule TAKE 1- 2 CAPSULES ONCE A DAY FOR NAUSEA AND REFLUX. USE AS NEEDED- DOES NOT HAVE TO TAKE EVERY DAY 180 capsule 1  . rosuvastatin (CRESTOR) 20 MG tablet TAKE 1 TABLET BY MOUTH EVERY DAY 90 tablet 1  . terbinafine (LAMISIL) 250 MG tablet TAKE 1 TABLET BY MOUTH EVERY DAY FOR 12 WEEKS 30 tablet 2   No current facility-administered medications for this visit.    Allergies  Allergen Reactions  . Codeine Nausea Only    Pt states she has taken this with no problems.  .  Zoloft  [Sertraline Hcl]   . Lamotrigine Rash    Other reaction(s): RASH     Past Medical History:  Diagnosis Date  . Chronic kidney disease (CKD), stage III (moderate)   . Depression   . Essential hypertension, benign   . Hyperlipidemia   . Hypothyroid   . Insomnia, unspecified   . Menopause    lmp 06/2007  . Type II or unspecified type diabetes mellitus without mention of complication, not stated as uncontrolled     Past Surgical History:  Procedure Laterality Date  . [place]  . EYE SURGERY    . RETINAL DETACHMENT REPAIR W/ SCLERAL BUCKLE LE  11/12/2011    Social History   Socioeconomic History  . Marital status: Divorced    Spouse name: Not on file  . Number of children: 3  . Years of education: 47  . Highest education level: Not on file  Occupational History  . Occupation: disabled  Tobacco Use  . Smoking status: Former Smoker    Packs/day: 1.00    Years: 3.00    Pack years: 3.00    Types: Cigarettes  . Smokeless tobacco: Never Used  . Tobacco comment: quit 08/2003  Substance and Sexual Activity  . Alcohol use: Yes    Comment: rare-beer or  wine  . Drug use: No  . Sexual activity: Not Currently  Other Topics Concern  . Not on file  Social History Narrative   Lives alone in a 2 story home.  Has 3 children.  On disability.  Did work for [employer] for 29 years.  Education: some college.    Social Determinants of Health   Financial Resource Strain: Low Risk   . Difficulty of Paying Living Expenses: Not hard at all  Food Insecurity: No Food Insecurity  . Worried About Charity fundraiser in the Last Year: Never true  . Ran Out of Food in the Last Year: Never true  Transportation Needs: No Transportation Needs  . Lack of Transportation (Medical): No  . Lack of Transportation (Non-Medical): No  Physical Activity:   . Days of Exercise per Week:   . Minutes of Exercise per Session:   Stress:   . Feeling of Stress :   Social  Connections:   . Frequency of Communication with Friends and Family:   . Frequency of Social Gatherings with Friends and Family:   . Attends Religious Services:   . Active Member of Clubs or Organizations:   . Attends Archivist Meetings:   Marland Kitchen Marital Status:   Intimate Partner Violence:   . Fear of Current or Ex-Partner:   . Emotionally Abused:   Marland Kitchen Physically Abused:   . Sexually Abused:     Family History  Problem Relation Age of Onset  . Diabetes Mother        type 2  not sure onset  . Diabetes Other   . Hypertension Other     ROS: Peripheral neuropathy but no fevers or chills, productive cough, hemoptysis, dysphasia, odynophagia, melena, hematochezia, dysuria, hematuria, rash, seizure activity, orthopnea, PND, pedal edema, claudication. Remaining systems are negative.  Physical Exam:   Blood pressure (!) 134/94, pulse 84, height 5\' 4"  (1.626 m), weight 170 lb (77.1 kg).  General:  Well developed/well nourished in NAD Skin warm/dry Patient not depressed No peripheral clubbing Back-normal HEENT-normal/normal eyelids Neck supple/normal carotid upstroke bilaterally; no bruits; no JVD; no thyromegaly chest - CTA/ normal expansion CV - RRR/normal S1 and S2; no murmurs, rubs or gallops;  PMI nondisplaced Abdomen -NT/ND, no HSM, no mass, + bowel sounds, no bruit 2+ femoral pulses, no bruits Ext-no edema, chords, 2+ DP Neuro-grossly nonfocal  ECG -sinus rhythm at a rate of 84, nonspecific ST changes.  Personally reviewed  A/P  1 dyspnea-patient not volume overloaded on examination.  Risk factors include diabetes mellitus, hypertension and hyperlipidemia.  She does have renal insufficiency so we will avoid CTA.  I will arrange a [place] nuclear study for risk stratification.  I will also arrange an echocardiogram to assess LV function.  2 hypertension-blood pressure is mildly elevated; however she states typically controlled.  Continue present medical regimen and  follow.  3 diabetes mellitus-Per primary care.  4 hyperlipidemia-continue statin.  Kirk Ruths, MD

## 2019-11-15 ENCOUNTER — Ambulatory Visit (INDEPENDENT_AMBULATORY_CARE_PROVIDER_SITE_OTHER): Payer: Medicare HMO | Admitting: Cardiology

## 2019-11-15 ENCOUNTER — Other Ambulatory Visit: Payer: Self-pay

## 2019-11-15 ENCOUNTER — Encounter: Payer: Self-pay | Admitting: *Deleted

## 2019-11-15 ENCOUNTER — Encounter: Payer: Self-pay | Admitting: Cardiology

## 2019-11-15 VITALS — BP 134/94 | HR 84 | Ht 64.0 in | Wt 170.0 lb

## 2019-11-15 DIAGNOSIS — E7800 Pure hypercholesterolemia, unspecified: Secondary | ICD-10-CM | POA: Diagnosis not present

## 2019-11-15 DIAGNOSIS — I10 Essential (primary) hypertension: Secondary | ICD-10-CM

## 2019-11-15 DIAGNOSIS — R0602 Shortness of breath: Secondary | ICD-10-CM | POA: Diagnosis not present

## 2019-11-15 NOTE — Patient Instructions (Signed)
Medication Instructions:  NO CHANGE *If you need a refill on your cardiac medications before your next appointment, please call your pharmacy*   Lab Work: If you have labs (blood work) drawn today and your tests are completely normal, you will receive your results only by: Marland Kitchen MyChart Message (if you have MyChart) OR . A paper copy in the mail If you have any lab test that is abnormal or we need to change your treatment, we will call you to review the results.   Testing/Procedures:  Your physician has requested that you have a lexiscan myoview. For further information please visit [email or web address]. Please follow instruction sheet, as given.[place] has requested that you have an echocardiogram. Echocardiography is a painless test that uses sound waves to create images of your heart. It provides your doctor with information about the size and shape of your heart and how well your heart's chambers and valves are working. This procedure takes approximately one hour. There are no restrictions for this procedure.[place]    Follow-Up: At [facility], you and your health needs are our priority.  As part of our continuing mission to provide you with exceptional heart care, we have created designated Provider Care Teams.  These Care Teams include your primary Cardiologist (physician) and Advanced Practice Providers (APPs -  Physician Assistants and Nurse Practitioners) who all work together to provide you with the care you need, when you need it.  We recommend signing up for the patient portal called "MyChart".  Sign up information is provided on this After Visit Summary.  MyChart is used to connect with patients for Virtual Visits (Telemedicine).  Patients are able to view lab/test results, encounter notes, upcoming appointments, etc.  Non-urgent messages can be sent to your provider as well.   To learn more about what you can do with MyChart, go to  [email or web address].    Your next appointment:    AS NEEDED

## 2019-11-30 ENCOUNTER — Encounter (HOSPITAL_COMMUNITY): Payer: Self-pay

## 2019-12-05 ENCOUNTER — Telehealth (HOSPITAL_COMMUNITY): Payer: Self-pay

## 2019-12-05 NOTE — Telephone Encounter (Signed)
Spoke with the patient, detailed instructions were given. She stated that she unserstood and would be here for her test. S.Lindaann Gradilla EMTP

## 2019-12-07 ENCOUNTER — Ambulatory Visit (HOSPITAL_COMMUNITY): Payer: Medicare HMO | Attending: Cardiology

## 2019-12-07 ENCOUNTER — Other Ambulatory Visit: Payer: Self-pay

## 2019-12-07 ENCOUNTER — Ambulatory Visit (HOSPITAL_BASED_OUTPATIENT_CLINIC_OR_DEPARTMENT_OTHER): Payer: Medicare HMO

## 2019-12-07 DIAGNOSIS — R0602 Shortness of breath: Secondary | ICD-10-CM | POA: Insufficient documentation

## 2019-12-07 LAB — MYOCARDIAL PERFUSION IMAGING
LV dias vol: 55 mL (ref 46–106)
LV sys vol: 18 mL
Peak HR: 87 {beats}/min
Rest HR: 68 {beats}/min
SDS: 0
SRS: 0
SSS: 0
TID: 1.04

## 2019-12-07 LAB — ECHOCARDIOGRAM COMPLETE
Height: 64 in
Weight: 2720 oz

## 2019-12-07 MED ORDER — REGADENOSON 0.4 MG/5ML IV SOLN
0.4000 mg | Freq: Once | INTRAVENOUS | Status: AC
  Administered 2019-12-07: 0.4 mg via INTRAVENOUS

## 2019-12-07 MED ORDER — TECHNETIUM TC 99M TETROFOSMIN IV KIT
11.0000 | PACK | Freq: Once | INTRAVENOUS | Status: AC | PRN
  Administered 2019-12-07: 11 via INTRAVENOUS
  Filled 2019-12-07: qty 11

## 2019-12-07 MED ORDER — TECHNETIUM TC 99M TETROFOSMIN IV KIT
32.8000 | PACK | Freq: Once | INTRAVENOUS | Status: AC | PRN
  Administered 2019-12-07: 32.8 via INTRAVENOUS
  Filled 2019-12-07: qty 33

## 2019-12-08 ENCOUNTER — Encounter: Payer: Self-pay | Admitting: Cardiology

## 2019-12-08 NOTE — Telephone Encounter (Signed)
This encounter was created in error - please disregard.

## 2019-12-08 NOTE — Telephone Encounter (Signed)
Follow Up:     Returning a call from yesterday, concerning her Echo results.

## 2019-12-11 DIAGNOSIS — L650 Telogen effluvium: Secondary | ICD-10-CM | POA: Diagnosis not present

## 2019-12-22 ENCOUNTER — Other Ambulatory Visit: Payer: Self-pay

## 2019-12-22 DIAGNOSIS — K219 Gastro-esophageal reflux disease without esophagitis: Secondary | ICD-10-CM

## 2019-12-22 DIAGNOSIS — E785 Hyperlipidemia, unspecified: Secondary | ICD-10-CM

## 2019-12-22 DIAGNOSIS — I10 Essential (primary) hypertension: Secondary | ICD-10-CM

## 2019-12-22 MED ORDER — LISINOPRIL 20 MG PO TABS: 20.0000 mg | ORAL_TABLET | Freq: Every day | ORAL | 3 refills | Status: DC

## 2019-12-22 MED ORDER — OMEPRAZOLE 20 MG PO CPDR: DELAYED_RELEASE_CAPSULE | ORAL | 1 refills | Status: DC

## 2019-12-22 MED ORDER — ROSUVASTATIN CALCIUM 20 MG PO TABS: 20.0000 mg | ORAL_TABLET | Freq: Every day | ORAL | 1 refills | Status: DC

## 2019-12-26 ENCOUNTER — Other Ambulatory Visit: Payer: Self-pay

## 2019-12-27 ENCOUNTER — Encounter: Payer: Self-pay | Admitting: Neurology

## 2019-12-27 ENCOUNTER — Other Ambulatory Visit: Payer: Self-pay

## 2019-12-27 ENCOUNTER — Telehealth: Payer: Self-pay | Admitting: Neurology

## 2019-12-27 ENCOUNTER — Other Ambulatory Visit: Payer: Self-pay | Admitting: Family Medicine

## 2019-12-27 DIAGNOSIS — I10 Essential (primary) hypertension: Secondary | ICD-10-CM

## 2019-12-27 DIAGNOSIS — E1142 Type 2 diabetes mellitus with diabetic polyneuropathy: Secondary | ICD-10-CM

## 2019-12-27 DIAGNOSIS — E034 Atrophy of thyroid (acquired): Secondary | ICD-10-CM

## 2019-12-27 MED ORDER — AMITRIPTYLINE HCL 100 MG PO TABS: ORAL_TABLET | ORAL | 0 refills | Status: DC

## 2019-12-27 MED ORDER — AMITRIPTYLINE HCL 25 MG PO TABS: 25.0000 mg | ORAL_TABLET | Freq: Every day | ORAL | 0 refills | Status: DC

## 2019-12-27 MED ORDER — AMLODIPINE BESYLATE 2.5 MG PO TABS: 2.5000 mg | ORAL_TABLET | Freq: Every day | ORAL | 3 refills | Status: DC

## 2019-12-27 MED ORDER — LEVOTHYROXINE SODIUM 50 MCG PO TABS: 50.0000 ug | ORAL_TABLET | Freq: Every day | ORAL | 3 refills | Status: DC

## 2019-12-27 NOTE — Telephone Encounter (Signed)
Elmyra Ricks from [facility] left msg with after hours about needing a refill on this patient's medication- Amitriptyline 100mg  and 25mg . I will work on [place] but please send in refills. Thanks!

## 2019-12-27 NOTE — Progress Notes (Signed)
Caroline Simmons Key: [id number] help? Call us at [phone] Outcome Additional Information Required Available without authorization. Drug Amitriptyline HCl 100MG  tablets Form [employer] Electronic PA Form

## 2019-12-27 NOTE — Telephone Encounter (Signed)
Update: Medication doesn't need PA per pt ins- please send refill.

## 2020-01-01 ENCOUNTER — Other Ambulatory Visit: Payer: Self-pay

## 2020-01-01 DIAGNOSIS — E1142 Type 2 diabetes mellitus with diabetic polyneuropathy: Secondary | ICD-10-CM

## 2020-01-01 DIAGNOSIS — R531 Weakness: Secondary | ICD-10-CM | POA: Diagnosis not present

## 2020-01-01 MED ORDER — AMITRIPTYLINE HCL 25 MG PO TABS: 25.0000 mg | ORAL_TABLET | Freq: Every day | ORAL | 0 refills | Status: DC

## 2020-01-31 DIAGNOSIS — N2581 Secondary hyperparathyroidism of renal origin: Secondary | ICD-10-CM | POA: Diagnosis not present

## 2020-01-31 DIAGNOSIS — Z87891 Personal history of nicotine dependence: Secondary | ICD-10-CM | POA: Diagnosis not present

## 2020-01-31 DIAGNOSIS — R809 Proteinuria, unspecified: Secondary | ICD-10-CM | POA: Diagnosis not present

## 2020-01-31 DIAGNOSIS — I129 Hypertensive chronic kidney disease with stage 1 through stage 4 chronic kidney disease, or unspecified chronic kidney disease: Secondary | ICD-10-CM | POA: Diagnosis not present

## 2020-01-31 DIAGNOSIS — Z794 Long term (current) use of insulin: Secondary | ICD-10-CM | POA: Diagnosis not present

## 2020-01-31 DIAGNOSIS — N1832 Chronic kidney disease, stage 3b: Secondary | ICD-10-CM | POA: Diagnosis not present

## 2020-01-31 DIAGNOSIS — E1140 Type 2 diabetes mellitus with diabetic neuropathy, unspecified: Secondary | ICD-10-CM | POA: Diagnosis not present

## 2020-01-31 DIAGNOSIS — E1122 Type 2 diabetes mellitus with diabetic chronic kidney disease: Secondary | ICD-10-CM | POA: Diagnosis not present

## 2020-02-14 NOTE — Progress Notes (Addendum)
[place] at [facility] [address] [place],  [address] [phone]  Date:  02/15/2020   Name:  Caroline Simmons   DOB:  April 27, 1956   MRN:  [id number]  PCP:  Darreld Mclean, MD    Chief Complaint: Back Pain (lower back pain, sever) and Ear Pain (left ear, stopped up)   History of Present Illness:  Caroline Simmons is a 64 y.o. very pleasant female patient who presents with the following:  Patient here today with concern of back pain History of diabetes with neuropathy, hypertension, hyperlipidemia, chronic kidney disease Last seen by myself in March  Her nephrologist is with [facility], Dr. Audie Clear I referred her to neurology this spring for concern of presyncopal sensation with exercise and also peripheral neuropathy  She underwent a Myoview this past June, reassuring  The left ventricular ejection fraction is hyperdynamic (>65%).  Nuclear stress EF: 68%.  There was no ST segment deviation noted during stress.  The study is normal.  This is a low risk study.    Lab Results  Component Value Date   HGBA1C 6.5 09/11/2019   She notes lower back pain and "tightness"- she notes an urge to lean over, she is more comfortable leaning on a counter or on a shopping cart This has been getting worse for 6 months or so She continues to have peripheral neuropathy, this may be getting worse  Her left ear feels as though it is blocked, not painful  Patient Active Problem List   Diagnosis Date Noted  . Abnormal urinalysis 09/14/2019  . Vitamin D deficiency 08/10/2017  . Secondary hyperparathyroidism of renal origin ([place]) 07/30/2016  . Chronic kidney disease, stage 3 04/08/2016  . Dyslipidemia 04/02/2016  . Proteinuria 04/02/2016  . Fatigue 12/25/2013  . Neuropathy 12/25/2013  . Dyslipidemia associated with type 2 diabetes mellitus ([place]) 05/30/2013  . Detached retina, left 11/25/2011  . Depression with anxiety  11/25/2011  . Neuropathy in diabetes ([place]) 09/15/2011  . Insomnia, unspecified   . Hypothyroidism   . DIABETES MELLITUS, TYPE II 08/26/2010  . Essential hypertension 08/26/2010  . RECTAL PAIN 08/26/2010  . Peripheral motor neuropathy 06/22/2009    Past Medical History:  Diagnosis Date  . Chronic kidney disease (CKD), stage III (moderate)   . Depression   . Essential hypertension, benign   . Hyperlipidemia   . Hypothyroid   . Insomnia, unspecified   . Menopause    lmp 06/2007  . Type II or unspecified type diabetes mellitus without mention of complication, not stated as uncontrolled     Past Surgical History:  Procedure Laterality Date  . [place]  . EYE SURGERY    . RETINAL DETACHMENT REPAIR W/ SCLERAL BUCKLE LE  11/12/2011    Social History   Tobacco Use  . Smoking status: Former Smoker    Packs/day: 1.00    Years: 3.00    Pack years: 3.00    Types: Cigarettes  . Smokeless tobacco: Never Used  . Tobacco comment: quit 08/2003  Vaping Use  . Vaping Use: Never used  Substance Use Topics  . Alcohol use: Yes    Comment: rare-beer or wine  . Drug use: No    Family History  Problem Relation Age of Onset  . Diabetes Mother        type 2  not sure onset  . Diabetes Other   . Hypertension Other  Allergies  Allergen Reactions  . Codeine Nausea Only    Pt states she has taken this with no problems.  . Zoloft  [Sertraline Hcl]   . Lamotrigine Rash    Other reaction(s): RASH    Medication list has been reviewed and updated.  Current Outpatient Medications on File Prior to Visit  Medication Sig Dispense Refill  . ACCU-CHEK AVIVA PLUS test strip USE  1  TEST  STRIP EVERY DAY 100 each 5  . Alcohol Swabs (B-D SINGLE USE SWABS REGULAR) PADS USE  1  SWAB EVERY DAY 100 each 3  . amitriptyline (ELAVIL) 100 MG tablet TAKE 1 TABLET BY MOUTH EVERYDAY AT BEDTIME 90 tablet 0  . amitriptyline (ELAVIL) 25 MG tablet Take 1 tablet (25 mg total) by  mouth daily. 90 tablet 0  . amLODipine (NORVASC) 2.5 MG tablet Take 1 tablet (2.5 mg total) by mouth daily. 90 tablet 3  . aspirin 81 MG tablet Take 81 mg by mouth daily.    . carbamazepine (TEGRETOL) 200 MG tablet TAKE 2 TABLETS BY MOUTH EVERY MORNING AND 1 TABLET IN THE EVENING 270 tablet 3  . levothyroxine (SYNTHROID) 50 MCG tablet Take 1 tablet (50 mcg total) by mouth daily. 90 tablet 3  . lisinopril (ZESTRIL) 20 MG tablet Take 1 tablet (20 mg total) by mouth daily. 90 tablet 3  . omeprazole (PRILOSEC) 20 MG capsule TAKE 1- 2 CAPSULES ONCE A DAY FOR NAUSEA AND REFLUX. USE AS NEEDED- DOES NOT HAVE TO TAKE EVERY DAY 180 capsule 1  . rosuvastatin (CRESTOR) 20 MG tablet Take 1 tablet (20 mg total) by mouth daily. 90 tablet 1  . terbinafine (LAMISIL) 250 MG tablet TAKE 1 TABLET BY MOUTH EVERY DAY FOR 12 WEEKS 30 tablet 2   No current facility-administered medications on file prior to visit.    Review of Systems:  As per HPI- otherwise negative.   Physical Examination: Vitals:   02/15/20 1534  BP: 136/90  Pulse: 99  SpO2: 97%   There were no vitals filed for this visit. There is no height or weight on file to calculate BMI. Ideal Body Weight:    GEN: no acute distress.  Obese, otherwise looks well HEENT: Atraumatic, Normocephalic.  Ears and Nose: No external deformity. CV: RRR, No M/G/R. No JVD. No thrill. No extra heart sounds. PULM: CTA B, no wheezes, crackles, rhonchi. No retractions. No resp. distress. No accessory muscle use. ABD: S, NT, ND, +BS. No rebound. No HSM. EXTR: No c/c/e PSYCH: Normally interactive. Conversant.  Patient has discomfort around the lower lumbar area, tightness of paraspinous lumbar muscles Thoracolumbar flexion is normal, extension is somewhat reduced Normal lower extremity strength, sensation, deep tendon reflex, straight leg raise Both TMs and external auditory canals are normal   Assessment and Plan: Hypothyroidism due to acquired atrophy of  thyroid - Plan: TSH, CANCELED: TSH  Controlled type 2 diabetes mellitus with diabetic polyneuropathy, without long-term current use of insulin ([place]) - Plan: Hemoglobin A1c, CANCELED: Hemoglobin A1c  Chronic midline low back pain without sciatica - Plan: DG Lumbar Spine Complete  Patient here today with a couple of concerns She felt as though her ear was blocked, but it appears to be normal.  She will keep an eye on this Routine labs pending as above Discussed her back pain symptoms.  I suspect she may be suffering from spinal stenosis.  We will obtain films of her back today and be in touch with further details This visit occurred  during the SARS-CoV-2 public health emergency.  Safety protocols were in place, including screening questions prior to the visit, additional usage of staff PPE, and extensive cleaning of exam room while observing appropriate contact time as indicated for disinfecting solutions.    Signed Lamar Blinks, MD  addnd 8/27- received her labs and films as below.  Letter to pt DG Lumbar Spine Complete  Result Date: 02/15/2020 CLINICAL DATA:  Chronic low back pain EXAM: LUMBAR SPINE - COMPLETE 4+ VIEW COMPARISON:  None. FINDINGS: Five lumbar type vertebral bodies are well visualized. Vertebral body height is well maintained. No pars defects are noted. Mild osteophytic changes are seen. No soft tissue abnormality is noted. IMPRESSION: Degenerative change without acute abnormality. Electronically Signed   By: Inez Catalina M.D.   On: 02/15/2020 16:40   Results for orders placed or performed in visit on 02/15/20  Hemoglobin A1c  Result Value Ref Range   Hgb A1c MFr Bld 6.9 (H) <5.7 % of total Hgb   Mean Plasma Glucose 151 (calc)   eAG (mmol/L) 8.4 (calc)  TSH  Result Value Ref Range   TSH 1.43 0.40 - 4.50 mIU/L

## 2020-02-15 ENCOUNTER — Ambulatory Visit (INDEPENDENT_AMBULATORY_CARE_PROVIDER_SITE_OTHER): Payer: Medicare HMO | Admitting: Family Medicine

## 2020-02-15 ENCOUNTER — Other Ambulatory Visit: Payer: Self-pay

## 2020-02-15 ENCOUNTER — Ambulatory Visit (HOSPITAL_BASED_OUTPATIENT_CLINIC_OR_DEPARTMENT_OTHER)
Admission: RE | Admit: 2020-02-15 | Discharge: 2020-02-15 | Disposition: A | Discharge status: Home / Self Care | Payer: Medicare HMO | Source: Ambulatory Visit | Attending: Family Medicine | Admitting: Family Medicine

## 2020-02-15 ENCOUNTER — Encounter: Payer: Self-pay | Admitting: Family Medicine

## 2020-02-15 VITALS — BP 136/90 | HR 99

## 2020-02-15 DIAGNOSIS — E034 Atrophy of thyroid (acquired): Secondary | ICD-10-CM | POA: Diagnosis not present

## 2020-02-15 DIAGNOSIS — G8929 Other chronic pain: Secondary | ICD-10-CM | POA: Insufficient documentation

## 2020-02-15 DIAGNOSIS — M545 Low back pain, unspecified: Secondary | ICD-10-CM

## 2020-02-15 DIAGNOSIS — E1142 Type 2 diabetes mellitus with diabetic polyneuropathy: Secondary | ICD-10-CM

## 2020-02-15 IMAGING — DX DG LUMBAR SPINE COMPLETE 4+V
5 series · 5 of 5 positions shown · non-contrast
Comparison: None.

CLINICAL DATA: Chronic low back pain

EXAM:
LUMBAR SPINE - COMPLETE 4+ VIEW

[l-spine ap]
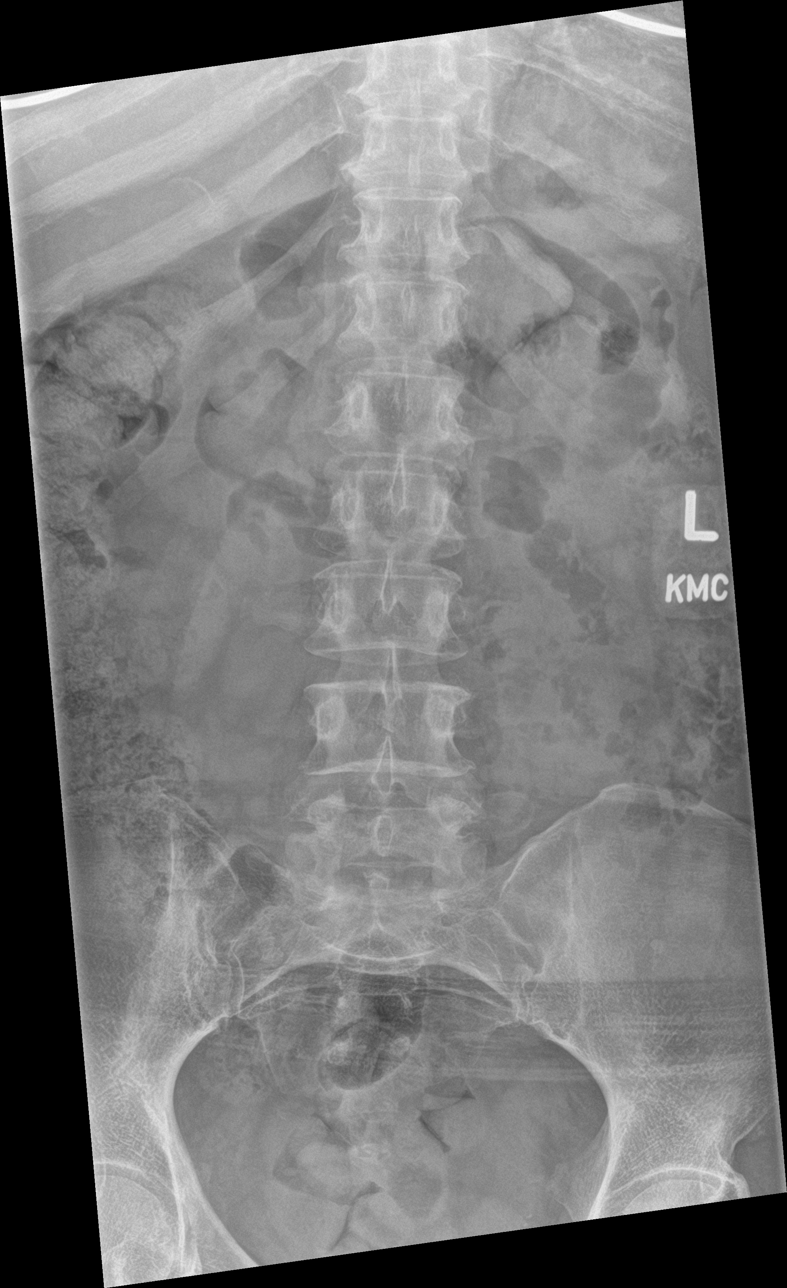

[l-spine obl (1 of 2)]
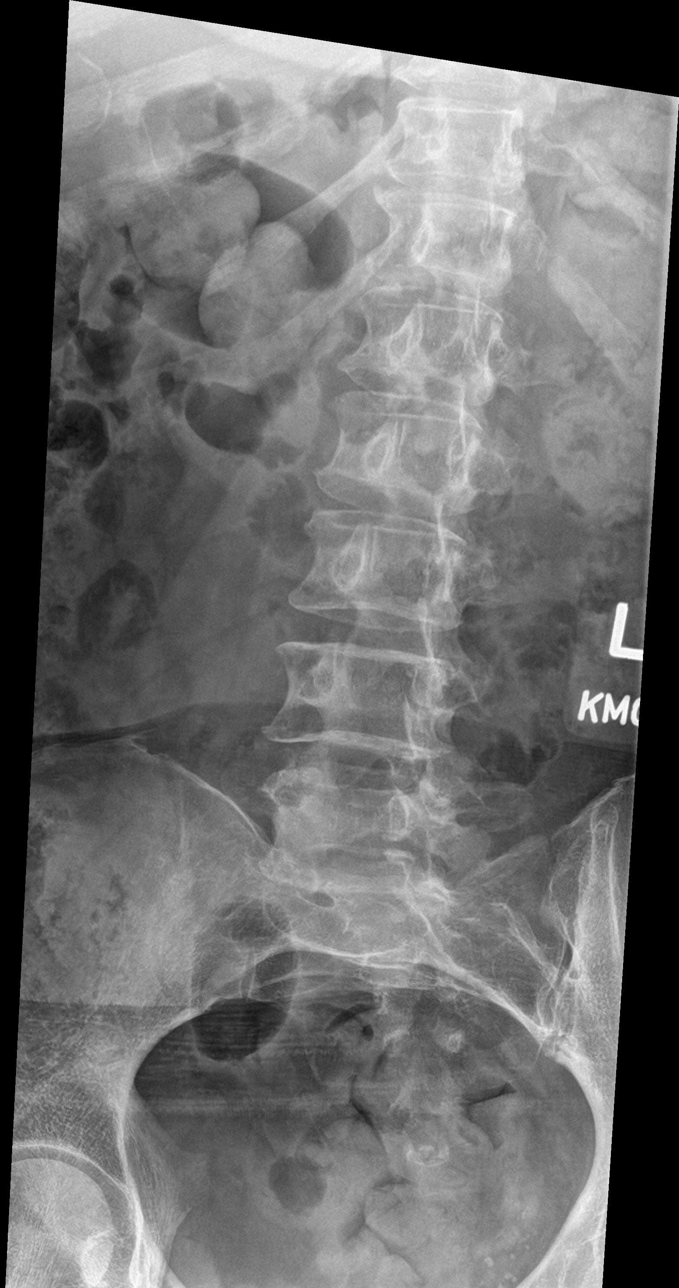

[l-spine obl (2 of 2)]
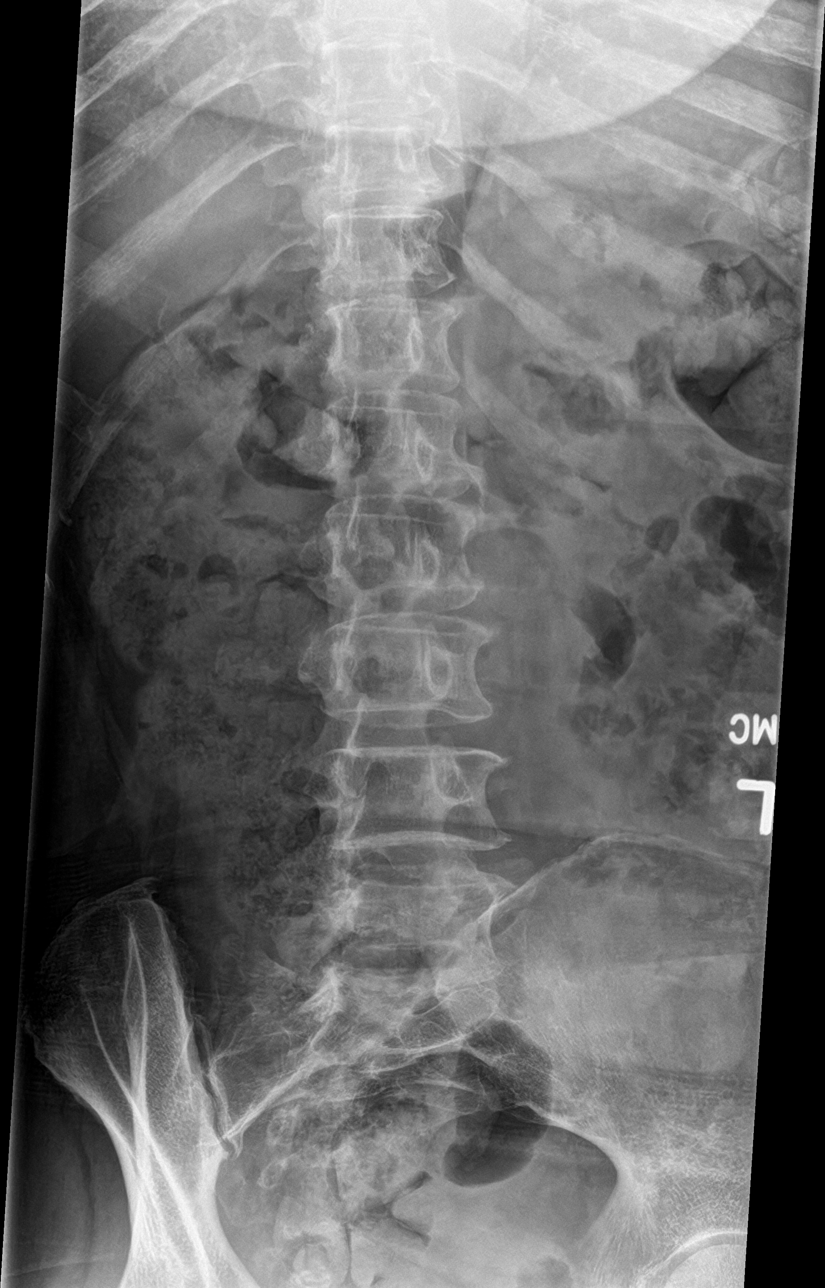

[l-spine lat]
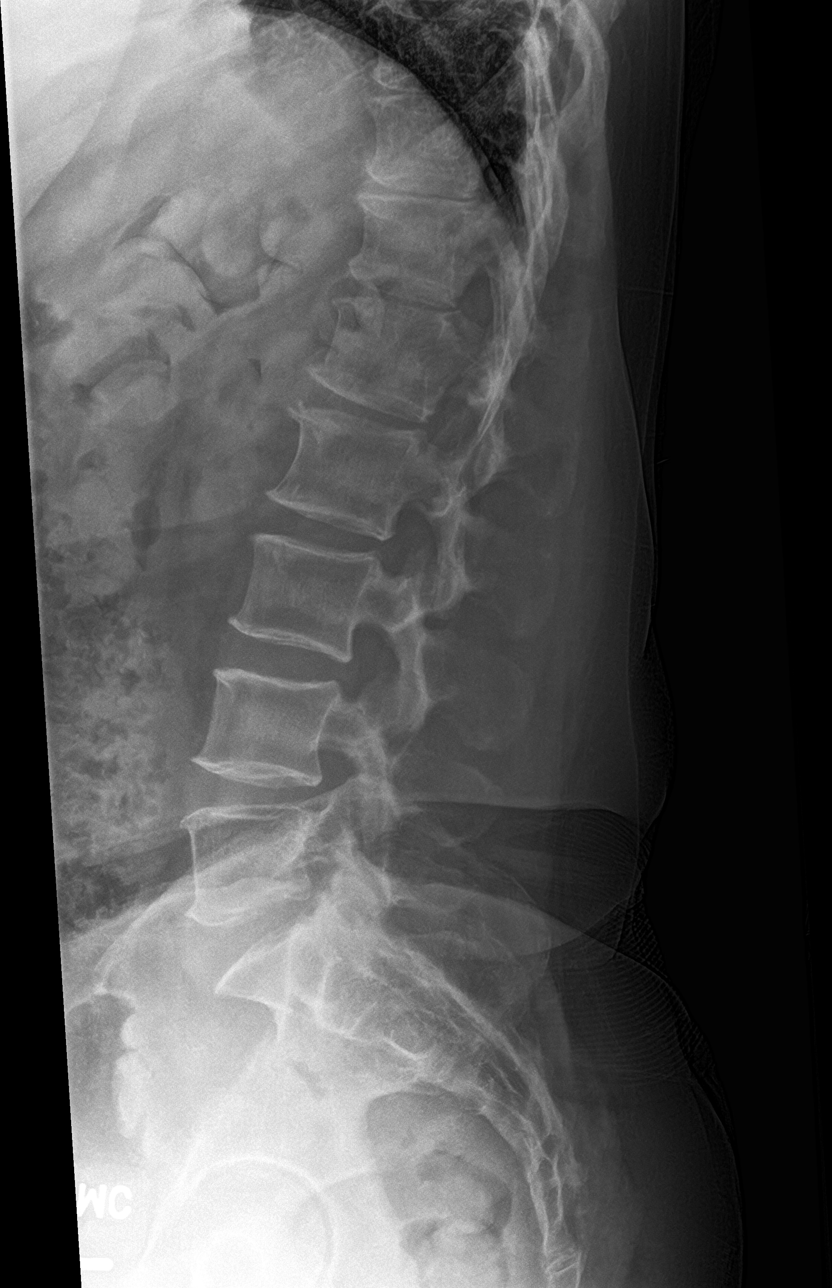

[l-spine spot]
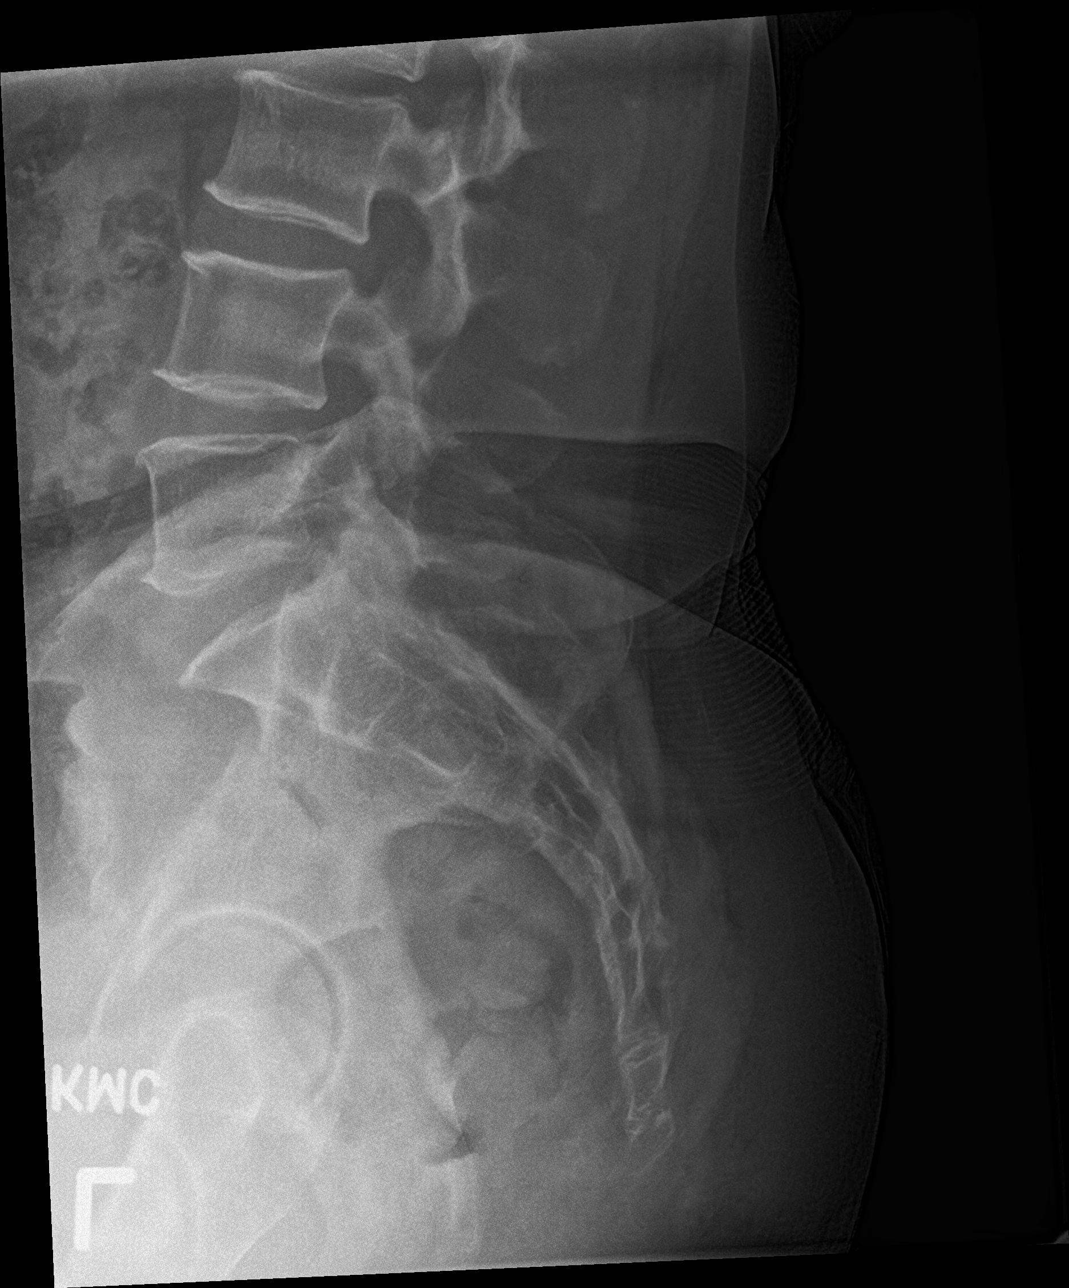

[5 of 5 positions shown; findings below may reference images not displayed]

FINDINGS: Five lumbar type vertebral bodies are well visualized. Vertebral
body height is well maintained. No pars defects are noted. Mild
osteophytic changes are seen. No soft tissue abnormality is noted.
IMPRESSION: Degenerative change without acute abnormality.

## 2020-02-15 NOTE — Patient Instructions (Addendum)
Please go to lab and then to the x-ray dept on the ground floor  Please ask about a facility fee  You can use tylenol as needed for pian- ok to take max dose on the bottle Try some diclofenac gel for your back pain

## 2020-02-16 LAB — HEMOGLOBIN A1C
Hgb A1c MFr Bld: 6.9 % of total Hgb — ABNORMAL HIGH (ref <5.7)
Mean Plasma Glucose: 151 (calc)
eAG (mmol/L): 8.4 (calc)

## 2020-02-16 LAB — TSH: TSH: 1.43 mIU/L (ref 0.40–4.50)

## 2020-03-04 ENCOUNTER — Other Ambulatory Visit: Payer: Self-pay | Admitting: Family Medicine

## 2020-03-04 DIAGNOSIS — K219 Gastro-esophageal reflux disease without esophagitis: Secondary | ICD-10-CM

## 2020-03-11 DIAGNOSIS — L650 Telogen effluvium: Secondary | ICD-10-CM | POA: Diagnosis not present

## 2020-03-26 DIAGNOSIS — E119 Type 2 diabetes mellitus without complications: Secondary | ICD-10-CM | POA: Diagnosis not present

## 2020-05-28 ENCOUNTER — Other Ambulatory Visit: Payer: Self-pay | Admitting: Neurology

## 2020-05-28 DIAGNOSIS — E1142 Type 2 diabetes mellitus with diabetic polyneuropathy: Secondary | ICD-10-CM

## 2020-06-05 DIAGNOSIS — Z794 Long term (current) use of insulin: Secondary | ICD-10-CM | POA: Diagnosis not present

## 2020-06-05 DIAGNOSIS — N1832 Chronic kidney disease, stage 3b: Secondary | ICD-10-CM | POA: Diagnosis not present

## 2020-06-05 DIAGNOSIS — E1140 Type 2 diabetes mellitus with diabetic neuropathy, unspecified: Secondary | ICD-10-CM | POA: Diagnosis not present

## 2020-06-05 DIAGNOSIS — N2581 Secondary hyperparathyroidism of renal origin: Secondary | ICD-10-CM | POA: Diagnosis not present

## 2020-06-05 DIAGNOSIS — Z87891 Personal history of nicotine dependence: Secondary | ICD-10-CM | POA: Diagnosis not present

## 2020-06-05 DIAGNOSIS — E1122 Type 2 diabetes mellitus with diabetic chronic kidney disease: Secondary | ICD-10-CM | POA: Diagnosis not present

## 2020-06-05 DIAGNOSIS — I129 Hypertensive chronic kidney disease with stage 1 through stage 4 chronic kidney disease, or unspecified chronic kidney disease: Secondary | ICD-10-CM | POA: Diagnosis not present

## 2020-07-11 ENCOUNTER — Telehealth: Payer: Self-pay | Admitting: Neurology

## 2020-07-11 DIAGNOSIS — E1142 Type 2 diabetes mellitus with diabetic polyneuropathy: Secondary | ICD-10-CM

## 2020-07-11 MED ORDER — AMITRIPTYLINE HCL 100 MG PO TABS: ORAL_TABLET | ORAL | 0 refills | Status: DC

## 2020-07-11 MED ORDER — AMITRIPTYLINE HCL 25 MG PO TABS: 25.0000 mg | ORAL_TABLET | Freq: Every day | ORAL | 0 refills | Status: DC

## 2020-07-11 NOTE — Telephone Encounter (Signed)
Patient called in stating she needs a refill of her amitriptyline sent to [facility] on [address] in [place]

## 2020-07-11 NOTE — Telephone Encounter (Signed)
LMOVM script sent into pharmacy.

## 2020-07-11 NOTE — Telephone Encounter (Signed)
90-day supply of amitriptyline sent.  She has a follow-up visit next month.

## 2020-07-12 ENCOUNTER — Ambulatory Visit: Payer: Medicare HMO | Admitting: Neurology

## 2020-08-01 ENCOUNTER — Other Ambulatory Visit: Payer: Self-pay | Admitting: Family Medicine

## 2020-08-01 DIAGNOSIS — E785 Hyperlipidemia, unspecified: Secondary | ICD-10-CM

## 2020-08-19 ENCOUNTER — Other Ambulatory Visit: Payer: Self-pay

## 2020-08-19 ENCOUNTER — Encounter: Payer: Self-pay | Admitting: Neurology

## 2020-08-19 ENCOUNTER — Ambulatory Visit (INDEPENDENT_AMBULATORY_CARE_PROVIDER_SITE_OTHER): Payer: Medicare HMO | Admitting: Neurology

## 2020-08-19 VITALS — BP 143/87 | HR 86 | Ht 63.0 in | Wt 171.0 lb

## 2020-08-19 DIAGNOSIS — E1142 Type 2 diabetes mellitus with diabetic polyneuropathy: Secondary | ICD-10-CM | POA: Diagnosis not present

## 2020-08-19 DIAGNOSIS — E1140 Type 2 diabetes mellitus with diabetic neuropathy, unspecified: Secondary | ICD-10-CM | POA: Diagnosis not present

## 2020-08-19 MED ORDER — AMITRIPTYLINE HCL 25 MG PO TABS: 25.0000 mg | ORAL_TABLET | Freq: Every day | ORAL | 3 refills | Status: DC

## 2020-08-19 MED ORDER — AMITRIPTYLINE HCL 100 MG PO TABS: ORAL_TABLET | ORAL | 3 refills | Status: DC

## 2020-08-19 NOTE — Patient Instructions (Signed)
Return to clinic in 1 year.

## 2020-08-19 NOTE — Progress Notes (Signed)
Asking for fungus meds for her toes

## 2020-08-19 NOTE — Progress Notes (Signed)
Follow-up Visit   Date: 08/19/20    Caroline Simmons MRN: [id number] DOB: Jan 22, 1956   Interim History: Caroline Simmons is a 65 y.o. right-handed African American female with type 2 diabetes mellitus complicated by stage III CKD and neuropathy, hypertension, and hypothyroidism returning to the clinic for follow-up of painful diabetic neuropathy.  The patient was accompanied to the clinic by self.  History of present illness: Starting around 2008, she started having numbness, burning, and tingling of the feet. Her pain is above the level of the ankles and constant.  She denies any imbalance and walks unassisted.  She has mild tingling in the left hand, which is intermittent.  She has noticed greater difficulty with opening jars and holding onto objects.   She is trying very hard to keep her diabetes controlled with diet, last HbA1c 6.4.   She was followed by Dr. Rema Jasmine at [facility].  Per  records, she had electrodiagnostic testing in March 2015 which did not show large fiber neuropathy.  She has previously tried Lyrica, gabapentin, cymbalta, lamictal, and dilantin.  She has the greatest benefit with amitriptyline 100 mg at bedtime and Tegretol 400 mg in the morning and 200 mg at bedtime.  She was on a higher dose of Tegretol in the past, however, due to her renal function and cognitive side effects, but the dose was reduced. For severe pain, she takes hydrocodone 1-2 times per week.  She did not wish to see pain management in the past, when it was recommended.    UPDATE 08/19/2020: She is here for 1 year follow-up and requesting refills for amitriptyline which she takes 25mg  in the morning and 100mg  at bedtime + carbamazepine 400mg  in the morning nad 200mg  at bedtime. This regimen continues to control her pain adequately.  There has not been any progression of neuropathy. She continues to have some imbalance and found that home PT significantly helped. She is compliant with  doing home exercises.   Medications:  Current Outpatient Medications on File Prior to Visit  Medication Sig Dispense Refill  . rosuvastatin (CRESTOR) 20 MG tablet Take 1 tablet (20 mg total) by mouth daily. 90 tablet 1  . ACCU-CHEK AVIVA PLUS test strip USE  1  TEST  STRIP EVERY DAY 100 each 5  . Alcohol Swabs (B-D SINGLE USE SWABS REGULAR) PADS USE  1  SWAB EVERY DAY 100 each 3  . amitriptyline (ELAVIL) 100 MG tablet TAKE 1 TABLET BY MOUTH EVERYDAY AT BEDTIME 90 tablet 0  . amitriptyline (ELAVIL) 25 MG tablet Take 1 tablet (25 mg total) by mouth daily. 90 tablet 0  . amLODipine (NORVASC) 2.5 MG tablet Take 1 tablet (2.5 mg total) by mouth daily. 90 tablet 3  . aspirin 81 MG tablet Take 81 mg by mouth daily.    . carbamazepine (TEGRETOL) 200 MG tablet TAKE 2 TABLETS BY MOUTH EVERY MORNING AND 1 TABLET IN THE EVENING 270 tablet 3  . levothyroxine (SYNTHROID) 50 MCG tablet Take 1 tablet (50 mcg total) by mouth daily. 90 tablet 3  . lisinopril (ZESTRIL) 20 MG tablet Take 1 tablet (20 mg total) by mouth daily. 90 tablet 3  . omeprazole (PRILOSEC) 20 MG capsule TAKE 1- 2 CAPSULES ONCE A DAY FOR NAUSEA AND REFLUX. USE AS NEEDED- DOES NOT HAVE TO TAKE EVERY DAY 180 capsule 1  . terbinafine (LAMISIL) 250 MG tablet TAKE 1 TABLET BY MOUTH EVERY DAY FOR 12 WEEKS 30 tablet 2   No current  facility-administered medications on file prior to visit.    Allergies:  Allergies  Allergen Reactions  . Codeine Nausea Only    Pt states she has taken this with no problems.  . Zoloft  [Sertraline Hcl]   . Lamotrigine Rash    Other reaction(s): RASH    Vital Signs:  BP (!) 143/87   Pulse 86   Ht 5\' 3"  (1.6 m)   Wt 171 lb (77.6 kg)   SpO2 100%   BMI 30.29 kg/m    Neurological Exam: MENTAL STATUS including orientation to time, place, person, recent and remote memory, attention span and concentration, language, and fund of knowledge is normal.  Speech is not dysarthric.  MOTOR:  Motor strength is 5/5  in all extremities, except 5-/5 distal hand and toe weakness.  No atrophy, fasciculations or abnormal movements.  No pronator drift.  Tone is normal.    MSRs:  Reflexes are 2+/4 throughout, except absent Achilles.  SENSORY:  Absent vibration distal to ankles bilaterally, normal at the knees and hands.   COORDINATION/GAIT:  Normal finger-to- nose-finger. Gait narrow based and stable.   Data: Lab Results  Component Value Date   TSH 1.43 02/15/2020   Lab Results  Component Value Date   HGBA1C 6.9 (H) 02/15/2020    IMPRESSION/PLAN: Painful diabetic neuropathy, small fiber - controlled on medication regimen  - Previously tried: Lyrica, gabapentin, Cymbalta, Lamictal, and Dilantin  - Continue amitriptyline 25mg  in the morning and 100mg  at bedtime  - Continue carbamazepine 400mg  in the morning and 200mg  at bedtime  - Consider pain management referral, if pain is refractory   Return to clinic in 1 year  Thank you for allowing me to participate in patient's care.  If I can answer any additional questions, I would be pleased to do so.    Sincerely,    Joniel Graumann K. Posey Pronto, DO

## 2020-09-09 ENCOUNTER — Other Ambulatory Visit: Payer: Self-pay | Admitting: Family Medicine

## 2020-09-09 DIAGNOSIS — L638 Other alopecia areata: Secondary | ICD-10-CM | POA: Diagnosis not present

## 2020-09-09 DIAGNOSIS — I10 Essential (primary) hypertension: Secondary | ICD-10-CM

## 2020-09-09 DIAGNOSIS — L669 Cicatricial alopecia, unspecified: Secondary | ICD-10-CM | POA: Diagnosis not present

## 2020-09-09 DIAGNOSIS — D485 Neoplasm of uncertain behavior of skin: Secondary | ICD-10-CM | POA: Diagnosis not present

## 2020-10-23 ENCOUNTER — Encounter: Payer: Self-pay | Admitting: Gastroenterology

## 2020-11-19 DIAGNOSIS — N1832 Chronic kidney disease, stage 3b: Secondary | ICD-10-CM | POA: Diagnosis not present

## 2020-11-20 DIAGNOSIS — N1832 Chronic kidney disease, stage 3b: Secondary | ICD-10-CM | POA: Diagnosis not present

## 2020-11-20 DIAGNOSIS — Z794 Long term (current) use of insulin: Secondary | ICD-10-CM | POA: Diagnosis not present

## 2020-11-20 DIAGNOSIS — I129 Hypertensive chronic kidney disease with stage 1 through stage 4 chronic kidney disease, or unspecified chronic kidney disease: Secondary | ICD-10-CM | POA: Diagnosis not present

## 2020-11-20 DIAGNOSIS — N2581 Secondary hyperparathyroidism of renal origin: Secondary | ICD-10-CM | POA: Diagnosis not present

## 2020-11-20 DIAGNOSIS — Z87891 Personal history of nicotine dependence: Secondary | ICD-10-CM | POA: Diagnosis not present

## 2020-11-20 DIAGNOSIS — E1122 Type 2 diabetes mellitus with diabetic chronic kidney disease: Secondary | ICD-10-CM | POA: Diagnosis not present

## 2020-11-28 NOTE — Progress Notes (Addendum)
[place] at [facility] [address] [place], [place] [address] [phone]  Date:  12/02/2020   Name:  Caroline Simmons   DOB:  20-Mar-1956   MRN:  [id number]  PCP:  Darreld Mclean, MD    Chief Complaint: Edema (Bilateral feet edema, pain with walking)   History of Present Illness:  Caroline Simmons is a 65 y.o. very pleasant female patient who presents with the following:  Seen today for a follow-up visit Most recent visit with myself August 2021 History of diabetes with neuropathy, hypertension, hyperlipidemia, chronic kidney disease  Her nephrologist is with [facility], Dr. Sheilah Mins recent visit earlier this month 1. Stage 3b chronic kidney disease ([place]) Renal Function Panel  25(OH) Vitamin D Total  2. Type 2 diabetes mellitus with stage 3b chronic kidney disease, with long-term current use of insulin ([place])  3. Primary hypertension  4. Secondary hyperparathyroidism of renal origin ([place]) Intact PTH   CKD stage III 2/2 DM, HTN with proteinuria. Baseline 1.4-1.6. - Cr better at 1.4/GFR 41 -Electrolytes acceptable -Blood pressure well controlled -Diabetes reasonably controlled with last A1c of 6.9 -Has lost 9 Ib in the last 6 months. Congratulated. -Continue lisinopril for proteinuria  She also sees neurology for diabetic neuropathy, most recent visit in February-Dr. Posey Pronto IMPRESSION/PLAN: Painful diabetic neuropathy, small fiber - controlled on medication regimen              - Previously tried: Lyrica, gabapentin, Cymbalta, Lamictal, and Dilantin              - Continue amitriptyline 25mg  in the morning and 100mg  at bedtime              - Continue carbamazepine 400mg  in the morning and 200mg  at bedtime              - Consider pain management referral, if pain is refractory   Eye exam- done in March of this year, no change per her report  Foot exam is due-update today A1c due-update today Colon cancer screening- pt  reports that her GI doctor contacted her about screening and she wanted to know if she should respond- I told her yes Shingles vaccine Can offer pneumococcal vaccine- will give today  COVID booster-she did one so far  Patient Active Problem List   Diagnosis Date Noted   Abnormal urinalysis 09/14/2019   Vitamin D deficiency 08/10/2017   Secondary hyperparathyroidism of renal origin ([place]) 07/30/2016   Chronic kidney disease, stage 3 ([place]) 04/08/2016   Dyslipidemia 04/02/2016   Proteinuria 04/02/2016   Fatigue 12/25/2013   Neuropathy 12/25/2013   Dyslipidemia associated with type 2 diabetes mellitus ([place]) 05/30/2013   Detached retina, left 11/25/2011   Depression with anxiety 11/25/2011   Neuropathy in diabetes ([place]) 09/15/2011   Insomnia, unspecified    Hypothyroidism    DIABETES MELLITUS, TYPE II 08/26/2010   Essential hypertension 08/26/2010   RECTAL PAIN 08/26/2010   Peripheral motor neuropathy 06/22/2009    Past Medical History:  Diagnosis Date   Chronic kidney disease (CKD), stage III (moderate) ([place])    Depression    Essential hypertension, benign    Hyperlipidemia    Hypothyroid    Insomnia, unspecified    Menopause    lmp 06/2007   Type II or unspecified type diabetes mellitus without mention of complication, not stated as uncontrolled     Past Surgical History:  Procedure Laterality Date   CESAREAN SECTION  1991 and [place] W/ SCLERAL BUCKLE LE  11/12/2011    Social History   Tobacco Use   Smoking status: Former    Packs/day: 1.00    Years: 3.00    Pack years: 3.00    Types: Cigarettes   Smokeless tobacco: Never   Tobacco comments:    quit 08/2003  Vaping Use   Vaping Use: Never used  Substance Use Topics   Alcohol use: Yes    Alcohol/week: 1.0 standard drink    Types: 1 Glasses of wine per week    Comment: rare-beer or wine   Drug use: No    Family History  Problem Relation Age of Onset   Diabetes  Mother        type 2  not sure onset   Diabetes Other    Hypertension Other     Allergies  Allergen Reactions   Codeine Nausea Only    Pt states she has taken this with no problems.   Zoloft  [Sertraline Hcl]    Lamotrigine Rash    Other reaction(s): RASH    Medication list has been reviewed and updated.  Current Outpatient Medications on File Prior to Visit  Medication Sig Dispense Refill   ACCU-CHEK AVIVA PLUS test strip USE  1  TEST  STRIP EVERY DAY 100 each 5   Alcohol Swabs (B-D SINGLE USE SWABS REGULAR) PADS USE  1  SWAB EVERY DAY 100 each 3   amitriptyline (ELAVIL) 100 MG tablet TAKE 1 TABLET BY MOUTH EVERYDAY AT BEDTIME 90 tablet 3   amitriptyline (ELAVIL) 25 MG tablet Take 1 tablet (25 mg total) by mouth daily. 90 tablet 3   amLODipine (NORVASC) 2.5 MG tablet Take 1 tablet (2.5 mg total) by mouth daily. 90 tablet 3   aspirin 81 MG tablet Take 81 mg by mouth daily.     carbamazepine (TEGRETOL) 200 MG tablet TAKE 2 TABLETS BY MOUTH EVERY MORNING AND 1 TABLET IN THE EVENING 270 tablet 3   finasteride (PROSCAR) 5 MG tablet      levothyroxine (SYNTHROID) 50 MCG tablet Take 1 tablet (50 mcg total) by mouth daily. 90 tablet 3   lisinopril (ZESTRIL) 20 MG tablet TAKE 1 TABLET BY MOUTH EVERY DAY 90 tablet 0   omeprazole (PRILOSEC) 20 MG capsule TAKE 1- 2 CAPSULES ONCE A DAY FOR NAUSEA AND REFLUX. USE AS NEEDED- DOES NOT HAVE TO TAKE EVERY DAY 180 capsule 1   rosuvastatin (CRESTOR) 20 MG tablet Take 1 tablet (20 mg total) by mouth daily. 90 tablet 1   No current facility-administered medications on file prior to visit.    Review of Systems:  As per HPI- otherwise negative.   Physical Examination: Vitals:   12/02/20 1129  BP: 126/80  Pulse: 82  Resp: 17  Temp: 97.7 F (36.5 C)  SpO2: 98%   Vitals:   12/02/20 1129  Weight: 163 lb (73.9 kg)  Height: 5\' 3"  (1.6 m)   Body mass index is 28.87 kg/m. Ideal Body Weight: Weight in (lb) to have BMI = 25: 140.8  GEN: no  acute distress.  Mild overweight, looks well HEENT: Atraumatic, Normocephalic.  Ears and Nose: No external deformity. CV: RRR, No M/G/R. No JVD. No thrill. No extra heart sounds. PULM: CTA B, no wheezes, crackles, rhonchi. No retractions. No resp. distress. No accessory muscle use. ABD: S, NT, ND No rebound. No HSM. EXTR: No c/c/e PSYCH: Normally interactive.  Conversant.  Foot exam: normal pulses. Pt says she can feel 0 monofilament sites -known history of diabetic neuropathy  Assessment and Plan: Controlled type 2 diabetes mellitus with diabetic polyneuropathy, without long-term current use of insulin (H[facility]) - Plan: Hemoglobin A1c  Fatigue, unspecified type - Plan: TSH, VITAMIN D 25 Hydroxy (Vit-D Deficiency, Fractures)  Hypothyroidism due to acquired atrophy of thyroid - Plan: TSH  Essential hypertension - Plan: CBC  Dyslipidemia - Plan: Lipid panel  Immunization due - Plan: Pneumococcal conjugate vaccine 20-valent (Prevnar 20)  Screening mammogram for breast cancer - Plan: MM 3D SCREEN BREAST BILATERAL  Estrogen deficiency - Plan: DG Bone Density Here today for routine follow-up visit.  We will check on her A1c and TSH, other labs as above Ordered mammogram and bone density Recommended routine colon cancer screening as indicated by the gastroenterologist Gave pneumonia booster today, recommend second COVID-19 booster If all is well, recheck 6 months Will plan further follow- up pending labs.  This visit occurred during the SARS-CoV-2 public health emergency.  Safety protocols were in place, including screening questions prior to the visit, additional usage of staff PPE, and extensive cleaning of exam room while observing appropriate contact time as indicated for disinfecting solutions.   Signed Lamar Blinks, MD  Received her labs as below- letter to pt  A1c is stable Results for orders placed or performed in visit on 12/02/20  Hemoglobin A1c  Result Value Ref Range    Hgb A1c MFr Bld 7.0 (H) 4.6 - 6.5 %  Lipid panel  Result Value Ref Range   Cholesterol 168 0 - 200 mg/dL   Triglycerides 196.0 (H) 0.0 - 149.0 mg/dL   HDL 56.10 >39.00 mg/dL   VLDL 39.2 0.0 - 40.0 mg/dL   LDL Cholesterol 73 0 - 99 mg/dL   Total CHOL/HDL Ratio 3    NonHDL 112.02   TSH  Result Value Ref Range   TSH 0.66 0.35 - 4.50 uIU/mL  VITAMIN D 25 Hydroxy (Vit-D Deficiency, Fractures)  Result Value Ref Range   VITD 41.94 30.00 - 100.00 ng/mL  CBC  Result Value Ref Range   WBC 5.8 4.0 - 10.5 K/uL   RBC 4.00 3.87 - 5.11 Mil/uL   Platelets 204.0 150.0 - 400.0 K/uL   Hemoglobin 12.2 12.0 - 15.0 g/dL   HCT 36.8 36.0 - 46.0 %   MCV 92.2 78.0 - 100.0 fl   MCHC 33.1 30.0 - 36.0 g/dL   RDW 12.7 11.5 - 15.5 %

## 2020-12-02 ENCOUNTER — Ambulatory Visit (INDEPENDENT_AMBULATORY_CARE_PROVIDER_SITE_OTHER): Payer: Medicare HMO | Admitting: Family Medicine

## 2020-12-02 ENCOUNTER — Encounter: Payer: Self-pay | Admitting: Family Medicine

## 2020-12-02 ENCOUNTER — Other Ambulatory Visit: Payer: Self-pay

## 2020-12-02 VITALS — BP 126/80 | HR 82 | Temp 97.7°F | Resp 17 | Ht 63.0 in | Wt 163.0 lb

## 2020-12-02 DIAGNOSIS — Z23 Encounter for immunization: Secondary | ICD-10-CM

## 2020-12-02 DIAGNOSIS — E1142 Type 2 diabetes mellitus with diabetic polyneuropathy: Secondary | ICD-10-CM | POA: Diagnosis not present

## 2020-12-02 DIAGNOSIS — I10 Essential (primary) hypertension: Secondary | ICD-10-CM

## 2020-12-02 DIAGNOSIS — E034 Atrophy of thyroid (acquired): Secondary | ICD-10-CM

## 2020-12-02 DIAGNOSIS — E785 Hyperlipidemia, unspecified: Secondary | ICD-10-CM | POA: Diagnosis not present

## 2020-12-02 DIAGNOSIS — R5383 Other fatigue: Secondary | ICD-10-CM

## 2020-12-02 DIAGNOSIS — E2839 Other primary ovarian failure: Secondary | ICD-10-CM

## 2020-12-02 DIAGNOSIS — Z1231 Encounter for screening mammogram for malignant neoplasm of breast: Secondary | ICD-10-CM

## 2020-12-02 LAB — LIPID PANEL
Cholesterol: 168 mg/dL (ref 0–200)
HDL: 56.1 mg/dL (ref >39.00)
LDL Cholesterol: 73 mg/dL (ref 0–99)
NonHDL: 112.02
Total CHOL/HDL Ratio: 3
Triglycerides: 196 mg/dL — ABNORMAL HIGH (ref 0.0–149.0)
VLDL: 39.2 mg/dL (ref 0.0–40.0)

## 2020-12-02 LAB — CBC
HCT: 36.8 % (ref 36.0–46.0)
Hemoglobin: 12.2 g/dL (ref 12.0–15.0)
MCHC: 33.1 g/dL (ref 30.0–36.0)
MCV: 92.2 fl (ref 78.0–100.0)
Platelets: 204 10*3/uL (ref 150.0–400.0)
RBC: 4 Mil/uL (ref 3.87–5.11)
RDW: 12.7 % (ref 11.5–15.5)
WBC: 5.8 10*3/uL (ref 4.0–10.5)

## 2020-12-02 LAB — VITAMIN D 25 HYDROXY (VIT D DEFICIENCY, FRACTURES): VITD: 41.94 ng/mL (ref 30.00–100.00)

## 2020-12-02 LAB — HEMOGLOBIN A1C: Hgb A1c MFr Bld: 7 % — ABNORMAL HIGH (ref 4.6–6.5)

## 2020-12-02 LAB — TSH: TSH: 0.66 u[IU]/mL (ref 0.35–4.50)

## 2020-12-02 NOTE — Patient Instructions (Signed)
Good to see you again today- I will be in touch with your labs asap We ordered a mammogram and bone density for you today I would recommend that you get 2 booster doses of covid 19 if not done already Pneumonia vaccine given today  Assuming all is well please see me in 6 months

## 2020-12-10 ENCOUNTER — Other Ambulatory Visit: Payer: Self-pay

## 2020-12-10 ENCOUNTER — Ambulatory Visit (HOSPITAL_BASED_OUTPATIENT_CLINIC_OR_DEPARTMENT_OTHER)
Admission: RE | Admit: 2020-12-10 | Discharge: 2020-12-10 | Disposition: A | Discharge status: Home / Self Care | Payer: Medicare HMO | Source: Ambulatory Visit | Attending: Family Medicine | Admitting: Family Medicine

## 2020-12-10 ENCOUNTER — Encounter: Payer: Self-pay | Admitting: Family Medicine

## 2020-12-10 ENCOUNTER — Encounter: Payer: Self-pay | Admitting: Gastroenterology

## 2020-12-10 ENCOUNTER — Encounter (HOSPITAL_BASED_OUTPATIENT_CLINIC_OR_DEPARTMENT_OTHER): Payer: Self-pay

## 2020-12-10 DIAGNOSIS — E2839 Other primary ovarian failure: Secondary | ICD-10-CM | POA: Insufficient documentation

## 2020-12-10 DIAGNOSIS — Z780 Asymptomatic menopausal state: Secondary | ICD-10-CM | POA: Diagnosis not present

## 2020-12-10 DIAGNOSIS — Z1231 Encounter for screening mammogram for malignant neoplasm of breast: Secondary | ICD-10-CM | POA: Insufficient documentation

## 2020-12-10 IMAGING — MG MM DIGITAL SCREENING BILAT W/ TOMO AND CAD
8 series · 8 of 24 positions shown · non-contrast
Comparison: Previous exam(s).

CLINICAL DATA: Screening.

EXAM:
DIGITAL SCREENING BILATERAL MAMMOGRAM WITH TOMOSYNTHESIS AND CAD
TECHNIQUE: Bilateral screening digital craniocaudal and mediolateral oblique
mammograms were obtained. Bilateral screening digital breast
tomosynthesis was performed. The images were evaluated with
computer-aided detection.

[L MLO synth-2D]
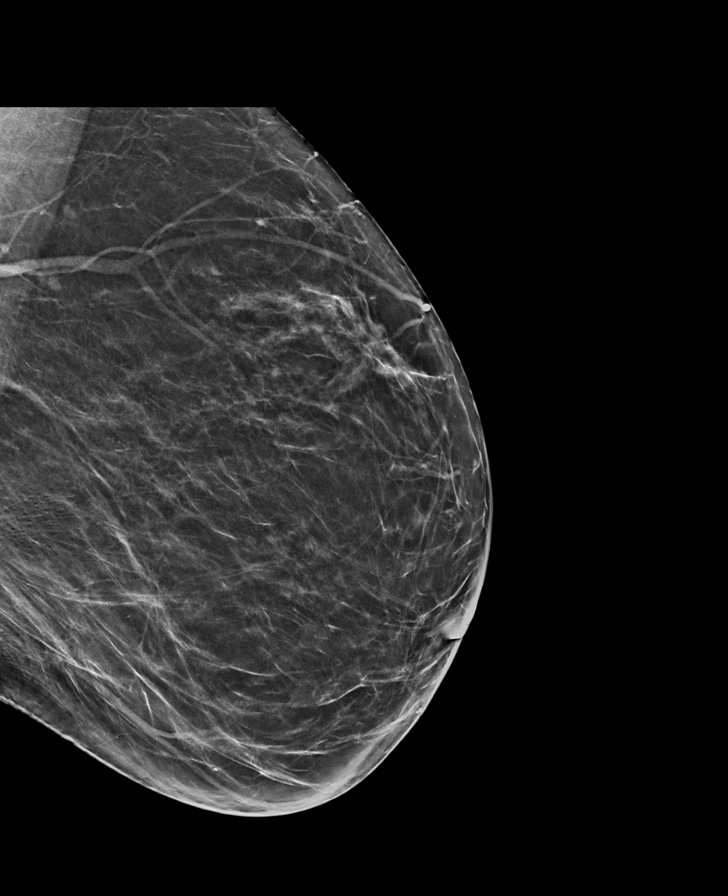

[R MLO synth-2D]
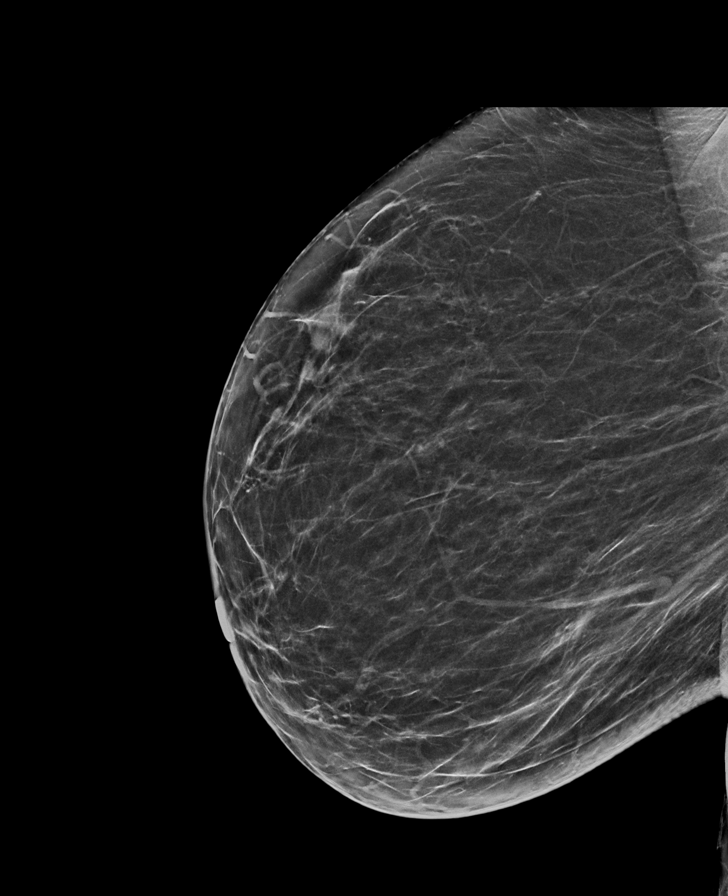

[R CC synth-2D]
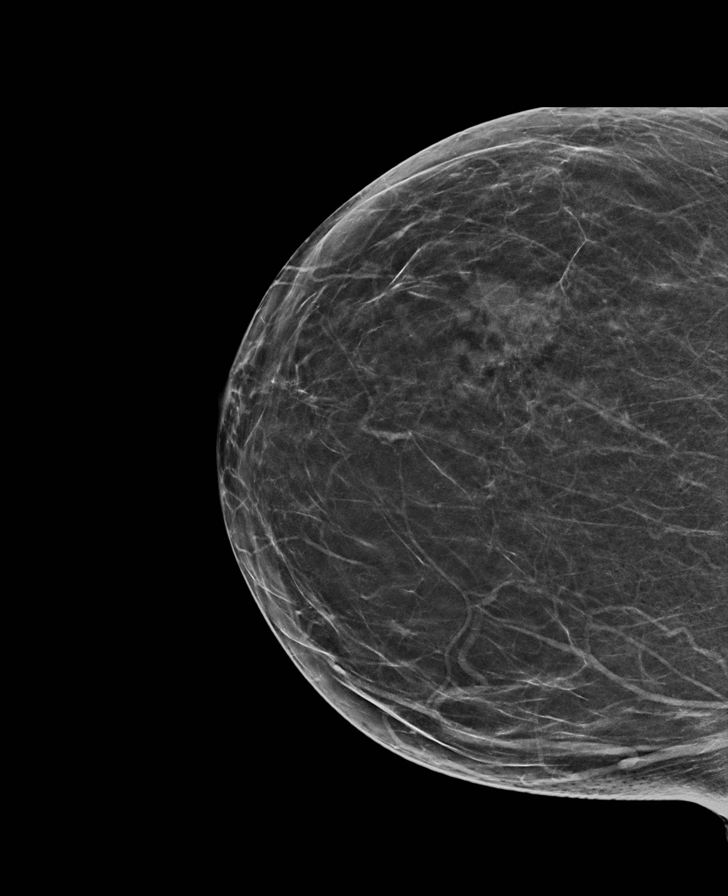

[L CC synth-2D]
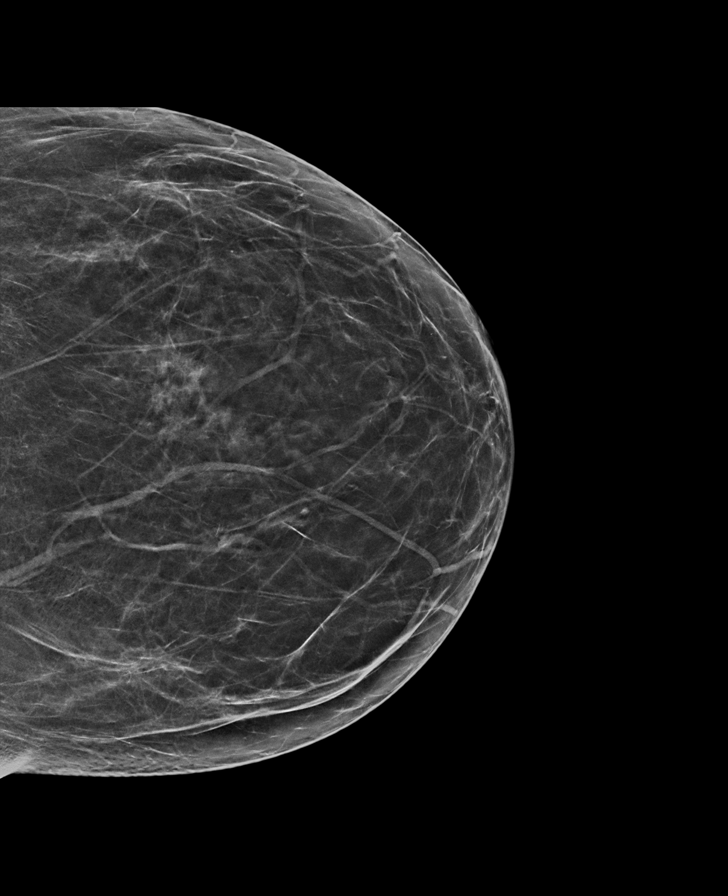

[L CC tomo · tomo slice 30/59.0]
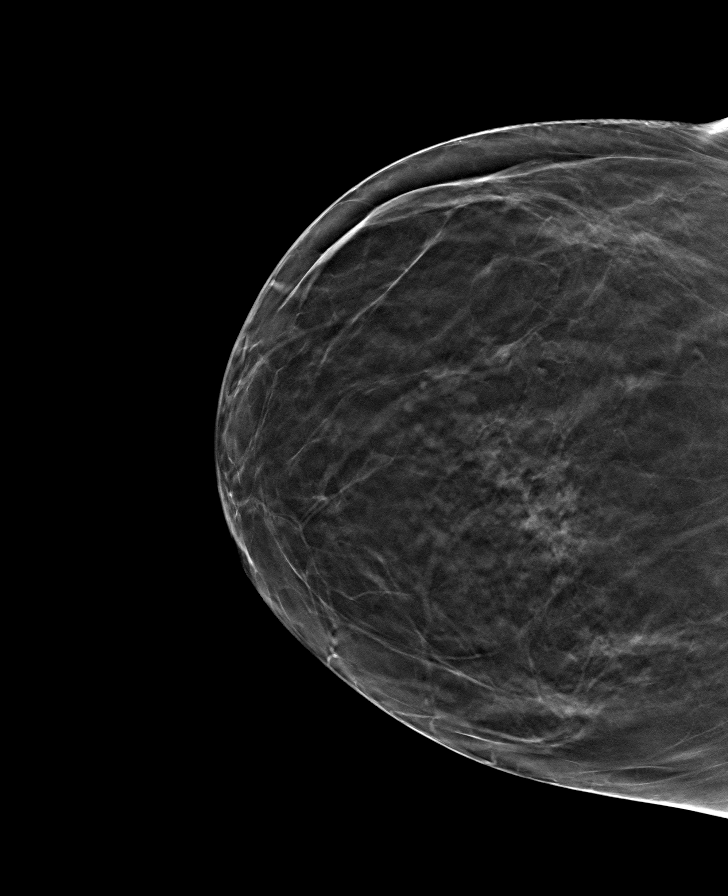

[L MLO tomo · tomo slice 31/61.0]
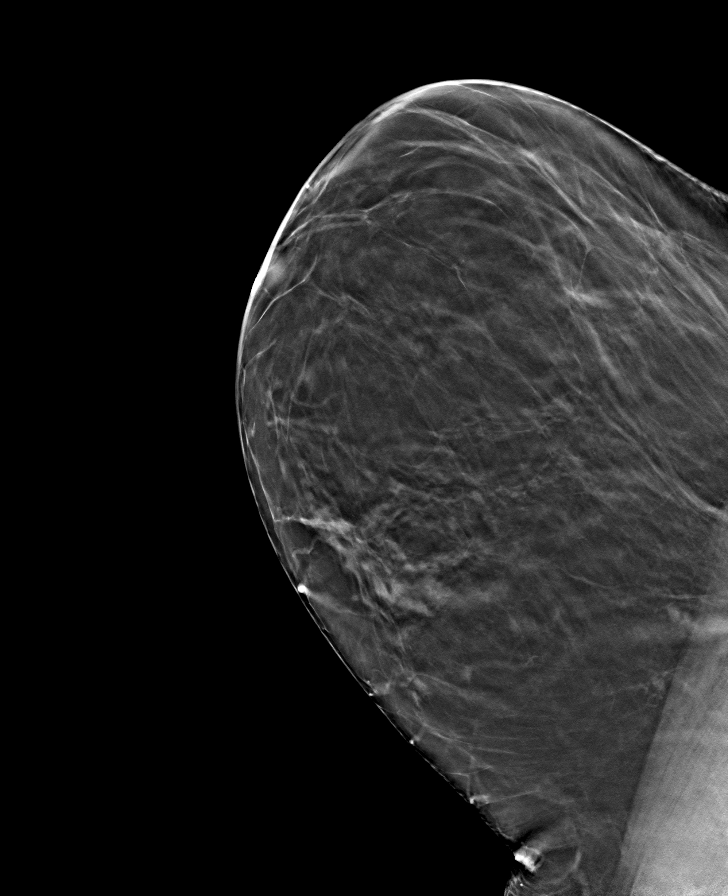

[R CC tomo · tomo slice 31/60.0]
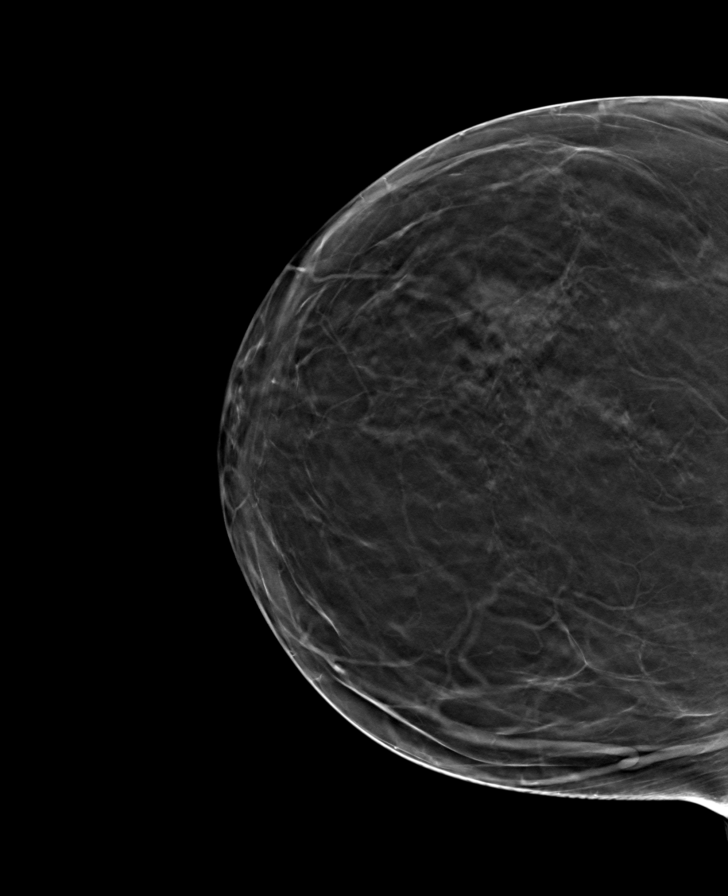

[R MLO tomo · tomo slice 33/65.0]
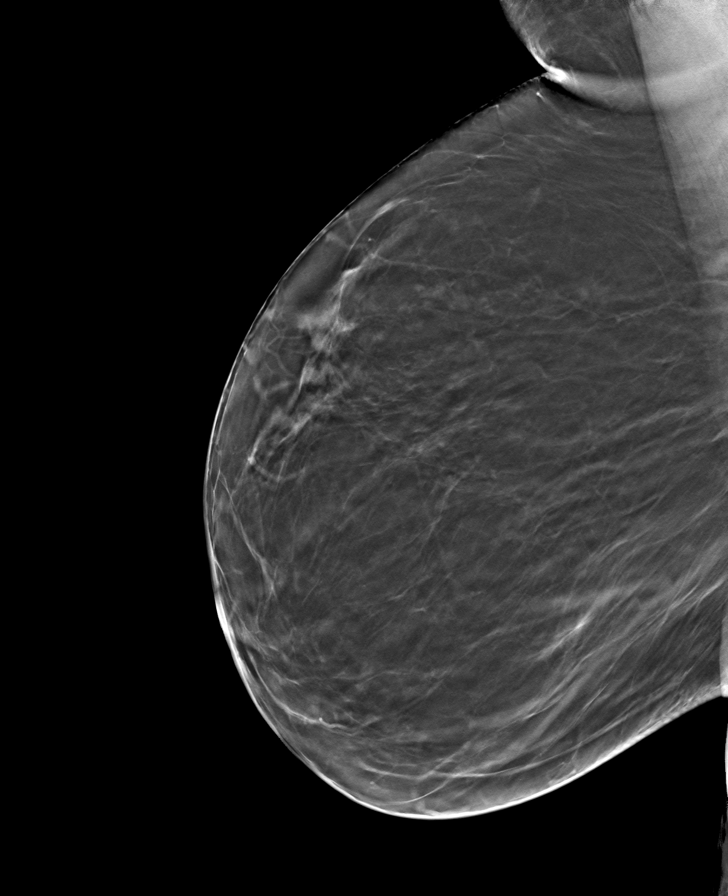

[8 of 24 positions shown; findings below may reference images not displayed]

ACR Breast Density Category b: There are scattered areas of
fibroglandular density.
FINDINGS: There are no findings suspicious for malignancy.
IMPRESSION: No mammographic evidence of malignancy. A result letter of this
screening mammogram will be mailed directly to the patient.

RECOMMENDATION:
Screening mammogram in one year. (Code:[BY])

BI-RADS CATEGORY  1: Negative.

## 2020-12-17 ENCOUNTER — Telehealth: Payer: Self-pay | Admitting: Family Medicine

## 2020-12-17 NOTE — Telephone Encounter (Signed)
Copied from [place] [phone]. Topic: Medicare AWV >> Dec 17, 2020  9:46 AM Harris-Coley, Hannah Beat wrote: Reason for CRM: Left message for patient to schedule Annual Wellness Visit.  Please schedule with Health Nurse Advisor Augustine Radar. at [facility].

## 2020-12-24 ENCOUNTER — Ambulatory Visit (INDEPENDENT_AMBULATORY_CARE_PROVIDER_SITE_OTHER): Payer: Medicare HMO

## 2020-12-24 VITALS — Ht 63.0 in | Wt 163.0 lb

## 2020-12-24 DIAGNOSIS — Z0000 Encounter for general adult medical examination without abnormal findings: Secondary | ICD-10-CM | POA: Diagnosis not present

## 2020-12-24 NOTE — Patient Instructions (Signed)
Caroline Simmons , Thank you for taking time to come for your Medicare Wellness Visit. I appreciate your ongoing commitment to your health goals. Please review the following plan we discussed and let me know if I can assist you in the future.   Screening recommendations/referrals: Colonoscopy: Scheduled for 01/2021 Mammogram: Completed 12/10/2020-Due 12/10/2021 Bone Density:  Completed 12/10/2020-Due 12/11/2022 Recommended yearly ophthalmology/optometry visit for glaucoma screening and checkup Recommended yearly dental visit for hygiene and checkup  Vaccinations: Influenza vaccine: Up to date Pneumococcal vaccine: Up to date Tdap vaccine: Up to date-Due 01/07/2027 Shingles vaccine:  Discuss with pharmacy Covid-19: Up to date  Advanced directives: Please bring a copy for your chart  Conditions/risks identified: See problem list  Next appointment: Follow up in one year for your annual wellness visit. 12/30/2020 @ 1:40  Preventive Care 40-64 Years, Female Preventive care refers to lifestyle choices and visits with your health care provider that can promote health and wellness. What does preventive care include? A yearly physical exam. This is also called an annual well check. Dental exams once or twice a year. Routine eye exams. Ask your health care provider how often you should have your eyes checked. Personal lifestyle choices, including: Daily care of your teeth and gums. Regular physical activity. Eating a healthy diet. Avoiding tobacco and drug use. Limiting alcohol use. Practicing safe sex. Taking low-dose aspirin daily starting at age 28. Taking vitamin and mineral supplements as recommended by your health care provider. What happens during an annual well check? The services and screenings done by your health care provider during your annual well check will depend on your age, overall health, lifestyle risk factors, and family history of disease. Counseling  Your health care provider  may ask you questions about your: Alcohol use. Tobacco use. Drug use. Emotional well-being. Home and relationship well-being. Sexual activity. Eating habits. Work and work Statistician. Method of birth control. Menstrual cycle. Pregnancy history. Screening  You may have the following tests or measurements: Height, weight, and BMI. Blood pressure. Lipid and cholesterol levels. These may be checked every 5 years, or more frequently if you are over 57 years old. Skin check. Lung cancer screening. You may have this screening every year starting at age 72 if you have a 30-pack-year history of smoking and currently smoke or have quit within the past 15 years. Fecal occult blood test (FOBT) of the stool. You may have this test every year starting at age 74. Flexible sigmoidoscopy or colonoscopy. You may have a sigmoidoscopy every 5 years or a colonoscopy every 10 years starting at age 51. Hepatitis C blood test. Hepatitis B blood test. Sexually transmitted disease (STD) testing. Diabetes screening. This is done by checking your blood sugar (glucose) after you have not eaten for a while (fasting). You may have this done every 1-3 years. Mammogram. This may be done every 1-2 years. Talk to your health care provider about when you should start having regular mammograms. This may depend on whether you have a family history of breast cancer. BRCA-related cancer screening. This may be done if you have a family history of breast, ovarian, tubal, or peritoneal cancers. Pelvic exam and Pap test. This may be done every 3 years starting at age 46. Starting at age 77, this may be done every 5 years if you have a Pap test in combination with an HPV test. Bone density scan. This is done to screen for osteoporosis. You may have this scan if you are at high risk for  osteoporosis. Discuss your test results, treatment options, and if necessary, the need for more tests with your health care provider. Vaccines   Your health care provider may recommend certain vaccines, such as: Influenza vaccine. This is recommended every year. Tetanus, diphtheria, and acellular pertussis (Tdap, Td) vaccine. You may need a Td booster every 10 years. Zoster vaccine. You may need this after age 49. Pneumococcal 13-valent conjugate (PCV13) vaccine. You may need this if you have certain conditions and were not previously vaccinated. Pneumococcal polysaccharide (PPSV23) vaccine. You may need one or two doses if you smoke cigarettes or if you have certain conditions. Talk to your health care provider about which screenings and vaccines you need and how often you need them. This information is not intended to replace advice given to you by your health care provider. Make sure you discuss any questions you have with your health care provider. Document Released: 07/05/2015 Document Revised: 02/26/2016 Document Reviewed: 04/09/2015 Elsevier Interactive Patient Education  2017 [place] Prevention in the Home Falls can cause injuries. They can happen to people of all ages. There are many things you can do to make your home safe and to help prevent falls. What can I do on the outside of my home? Regularly fix the edges of walkways and driveways and fix any cracks. Remove anything that might make you trip as you walk through a door, such as a raised step or threshold. Trim any bushes or trees on the path to your home. Use bright outdoor lighting. Clear any walking paths of anything that might make someone trip, such as rocks or tools. Regularly check to see if handrails are loose or broken. Make sure that both sides of any steps have handrails. Any raised decks and porches should have guardrails on the edges. Have any leaves, snow, or ice cleared regularly. Use sand or salt on walking paths during winter. Clean up any spills in your garage right away. This includes oil or grease spills. What can I do in the  bathroom? Use night lights. Install grab bars by the toilet and in the tub and shower. Do not use towel bars as grab bars. Use non-skid mats or decals in the tub or shower. If you need to sit down in the shower, use a plastic, non-slip stool. Keep the floor dry. Clean up any water that spills on the floor as soon as it happens. Remove soap buildup in the tub or shower regularly. Attach bath mats securely with double-sided non-slip rug tape. Do not have throw rugs and other things on the floor that can make you trip. What can I do in the bedroom? Use night lights. Make sure that you have a light by your bed that is easy to reach. Do not use any sheets or blankets that are too big for your bed. They should not hang down onto the floor. Have a firm chair that has side arms. You can use this for support while you get dressed. Do not have throw rugs and other things on the floor that can make you trip. What can I do in the kitchen? Clean up any spills right away. Avoid walking on wet floors. Keep items that you use a lot in easy-to-reach places. If you need to reach something above you, use a strong step stool that has a grab bar. Keep electrical cords out of the way. Do not use floor polish or wax that makes floors slippery. If you must use  wax, use non-skid floor wax. Do not have throw rugs and other things on the floor that can make you trip. What can I do with my stairs? Do not leave any items on the stairs. Make sure that there are handrails on both sides of the stairs and use them. Fix handrails that are broken or loose. Make sure that handrails are as long as the stairways. Check any carpeting to make sure that it is firmly attached to the stairs. Fix any carpet that is loose or worn. Avoid having throw rugs at the top or bottom of the stairs. If you do have throw rugs, attach them to the floor with carpet tape. Make sure that you have a light switch at the top of the stairs and the  bottom of the stairs. If you do not have them, ask someone to add them for you. What else can I do to help prevent falls? Wear shoes that: Do not have high heels. Have rubber bottoms. Are comfortable and fit you well. Are closed at the toe. Do not wear sandals. If you use a stepladder: Make sure that it is fully opened. Do not climb a closed stepladder. Make sure that both sides of the stepladder are locked into place. Ask someone to hold it for you, if possible. Clearly mark and make sure that you can see: Any grab bars or handrails. First and last steps. Where the edge of each step is. Use tools that help you move around (mobility aids) if they are needed. These include: Canes. Walkers. Scooters. Crutches. Turn on the lights when you go into a dark area. Replace any light bulbs as soon as they burn out. Set up your furniture so you have a clear path. Avoid moving your furniture around. If any of your floors are uneven, fix them. If there are any pets around you, be aware of where they are. Review your medicines with your doctor. Some medicines can make you feel dizzy. This can increase your chance of falling. Ask your doctor what other things that you can do to help prevent falls. This information is not intended to replace advice given to you by your health care provider. Make sure you discuss any questions you have with your health care provider. Document Released: 04/04/2009 Document Revised: 11/14/2015 Document Reviewed: 07/13/2014 Elsevier Interactive Patient Education  2017 [employer].

## 2020-12-24 NOTE — Progress Notes (Addendum)
Subjective:   Caroline Simmons is a 65 y.o. female who presents for Medicare Annual (Subsequent) preventive examination.  I connected with Nayelli today by telephone and verified that I am speaking with the correct person using two identifiers. Location patient: home Location provider: work Persons participating in the virtual visit: patient, Marine scientist.    I discussed the limitations, risks, security and privacy concerns of performing an evaluation and management service by telephone and the availability of in person appointments. I also discussed with the patient that there may be a patient responsible charge related to this service. The patient expressed understanding and verbally consented to this telephonic visit.    Interactive audio and video telecommunications were attempted between this provider and patient, however failed, due to patient having technical difficulties OR patient did not have access to video capability.  We continued and completed visit with audio only.  Some vital signs may be absent or patient reported.   Time Spent with patient on telephone encounter: 20 minutes   Review of Systems     Cardiac Risk Factors include: diabetes mellitus;dyslipidemia;hypertension     Objective:    Today's Vitals   12/24/20 1258 12/24/20 1259  Weight: 163 lb (73.9 kg)   Height: 5\' 3"  (1.6 m)   PainSc:  10-Worst pain ever   Body mass index is 28.87 kg/m.  Advanced Directives 12/24/2020 08/19/2020 09/14/2019 08/24/2019 08/19/2018 05/27/2016 03/13/2015  Does Patient Have a Medical Advance Directive? Yes Yes Yes No Yes Yes Yes  Type of Advance Directive Living will Living will - - [place];Living will Living will -  Does patient want to make changes to medical advance directive? - - - - No - Patient declined - -  Copy of [place] in Chart? - - - - No - copy requested - No - copy requested  Would patient like information on creating a medical advance  directive? - - - No - Patient declined - - -    Current Medications (verified) Outpatient Encounter Medications as of 12/24/2020  Medication Sig   ACCU-CHEK AVIVA PLUS test strip USE  1  TEST  STRIP EVERY DAY   Alcohol Swabs (B-D SINGLE USE SWABS REGULAR) PADS USE  1  SWAB EVERY DAY   amitriptyline (ELAVIL) 100 MG tablet TAKE 1 TABLET BY MOUTH EVERYDAY AT BEDTIME   amitriptyline (ELAVIL) 25 MG tablet Take 1 tablet (25 mg total) by mouth daily.   amLODipine (NORVASC) 2.5 MG tablet Take 1 tablet (2.5 mg total) by mouth daily.   aspirin 81 MG tablet Take 81 mg by mouth daily.   carbamazepine (TEGRETOL) 200 MG tablet TAKE 2 TABLETS BY MOUTH EVERY MORNING AND 1 TABLET IN THE EVENING   finasteride (PROSCAR) 5 MG tablet    levothyroxine (SYNTHROID) 50 MCG tablet Take 1 tablet (50 mcg total) by mouth daily.   lisinopril (ZESTRIL) 20 MG tablet TAKE 1 TABLET BY MOUTH EVERY DAY   omeprazole (PRILOSEC) 20 MG capsule TAKE 1- 2 CAPSULES ONCE A DAY FOR NAUSEA AND REFLUX. USE AS NEEDED- DOES NOT HAVE TO TAKE EVERY DAY   rosuvastatin (CRESTOR) 20 MG tablet Take 1 tablet (20 mg total) by mouth daily.   No facility-administered encounter medications on file as of 12/24/2020.    Allergies (verified) Codeine, Zoloft  [sertraline hcl], and Lamotrigine   History: Past Medical History:  Diagnosis Date   Chronic kidney disease (CKD), stage III (moderate) ([place])    Depression    Essential  hypertension, benign    Hyperlipidemia    Hypothyroid    Insomnia, unspecified    Menopause    lmp 06/2007   Type II or unspecified type diabetes mellitus without mention of complication, not stated as uncontrolled    Past Surgical History:  Procedure Laterality Date   CESAREAN SECTION  1991 and [place] W/ SCLERAL BUCKLE LE  11/12/2011   Family History  Problem Relation Age of Onset   Diabetes Mother        type 2  not sure onset   Diabetes Other    Hypertension Other     Social History   Socioeconomic History   Marital status: Divorced    Spouse name: Not on file   Number of children: 3   Years of education: 14   Highest education level: Not on file  Occupational History   Occupation: disabled  Tobacco Use   Smoking status: Former    Packs/day: 1.00    Years: 3.00    Pack years: 3.00    Types: Cigarettes   Smokeless tobacco: Never   Tobacco comments:    quit 08/2003  Vaping Use   Vaping Use: Never used  Substance and Sexual Activity   Alcohol use: Yes    Alcohol/week: 1.0 standard drink    Types: 1 Glasses of wine per week    Comment: rare-beer or wine   Drug use: No   Sexual activity: Not Currently  Other Topics Concern   Not on file  Social History Narrative   Lives alone in a 2 story home.  Has 3 children.  On disability.  Did work for [employer] for 29 years.  Education: some college.       Right handed    Social Determinants of Health   Financial Resource Strain: Medium Risk   Difficulty of Paying Living Expenses: Somewhat hard  Food Insecurity: Food Insecurity Present   Worried About Running Out of Food in the Last Year: Often true   Arboriculturist in the Last Year: Never true  Transportation Needs: No Transportation Needs   Lack of Transportation (Medical): No   Lack of Transportation (Non-Medical): No  Physical Activity: Insufficiently Active   Days of Exercise per Week: 5 days   Minutes of Exercise per Session: 20 min  Stress: No Stress Concern Present   Feeling of Stress : Only a little  Social Connections: Socially Isolated   Frequency of Communication with Friends and Family: More than three times a week   Frequency of Social Gatherings with Friends and Family: More than three times a week   Attends Religious Services: Never   Marine scientist or Organizations: No   Attends Archivist Meetings: Never   Marital Status: Divorced    Tobacco Counseling Counseling given: Not  Answered Tobacco comments: quit 08/2003   Clinical Intake:  Pre-visit preparation completed: Yes  Pain : 0-10 Pain Score: 10-Worst pain ever Pain Type: Chronic pain Pain Location: Foot Pain Orientation: Right, Left Pain Descriptors / Indicators: Burning Pain Onset: More than a month ago Pain Frequency: Constant     Nutritional Status: BMI 25 -29 Overweight Nutritional Risks: None Diabetes: Yes CBG done?: No Did pt. bring in CBG monitor from home?: No (phone visit)  How often do you need to have someone help you when you read instructions, pamphlets, or other written materials from your doctor or pharmacy?: 1 -  Never  Diabetes:  Is the patient diabetic?  Yes  If diabetic, was a CBG obtained today?  No  Did the patient bring in their glucometer from home?  No phone visit How often do you monitor your CBG's? Several times per week.   Financial Strains and Diabetes Management:  Are you having any financial strains with the device, your supplies or your medication? No .  Does the patient want to be seen by Chronic Care Management for management of their diabetes?  No  Would the patient like to be referred to a Nutritionist or for Diabetic Management?  No   Diabetic Exams:  Diabetic Eye Exam: Completed 08/2020. Awaiting notes  Diabetic Foot Exam: Pt has been advised about the importance in completing this exam. To be completed by PCP.    Interpreter Needed?: No  Information entered by :: Caroleen Hamman LPN   Activities of Daily Living In your present state of health, do you have any difficulty performing the following activities: 12/24/2020  Hearing? N  Vision? N  Difficulty concentrating or making decisions? N  Walking or climbing stairs? N  Dressing or bathing? N  Doing errands, shopping? N  Preparing Food and eating ? N  Using the Toilet? N  In the past six months, have you accidently leaked urine? N  Do you have problems with loss of bowel control? N   Managing your Medications? N  Managing your Finances? N  Housekeeping or managing your Housekeeping? N  Some recent data might be hidden    Patient Care Team: Copland, Gay Filler, MD as PCP - General (Family Medicine) Newton Pigg, MD as Consulting Physician (Obstetrics and Gynecology) Biagio Quint, MD as Referring Physician (Nephrology) Alda Berthold, DO as Consulting Physician (Neurology)  Indicate any recent Medical Services you may have received from other than [facility] providers in the past year (date may be approximate).     Assessment:   This is a routine wellness examination for Caroline Simmons.  Hearing/Vision screen Hearing Screening - Comments:: C/o mild hearing loss Vision Screening - Comments:: Wears glasses Last eye exam-08/2020  Dietary issues and exercise activities discussed: Current Exercise Habits: Home exercise routine, Type of exercise: walking, Time (Minutes): 15, Frequency (Times/Week): 5, Weekly Exercise (Minutes/Week): 75, Intensity: Mild, Exercise limited by: Other - see comments (Neuropathy in both feet)   Goals Addressed             This Visit's Progress    Decrease foot pain   Not on track    Patient Stated       Try to do more walking as tolerated        Depression Screen PHQ 2/9 Scores 12/24/2020 12/02/2020 08/24/2019 08/19/2018 05/27/2016 05/01/2015 03/13/2015  PHQ - 2 Score 1 0 0 0 3 0 2  PHQ- 9 Score - - - - 11 0 8    Fall Risk Fall Risk  12/24/2020 12/02/2020 08/19/2020 09/14/2019 08/24/2019  Falls in the past year? 1 1 1 1 1   Number falls in past yr: 1 0 1 1 0  Injury with Fall? 0 0 0 - 0  Comment - - - - -  Risk for fall due to : History of fall(s) - - - -  Follow up Falls prevention discussed - - - Education provided;Falls prevention discussed    FALL RISK PREVENTION PERTAINING TO THE HOME:  Any stairs in or around the home? Yes  If so, are there any without handrails? No  Home free of  loose throw rugs in walkways, pet beds, electrical cords,  etc? Yes  Adequate lighting in your home to reduce risk of falls? Yes   ASSISTIVE DEVICES UTILIZED TO PREVENT FALLS:  Life alert? No  Use of a cane, walker or w/c? No  Grab bars in the bathroom? No  Shower chair or bench in shower? No  Elevated toilet seat or a handicapped toilet? No   TIMED UP AND GO:  Was the test performed? No . Phone visit   Cognitive Function:Normal cognitive status assessed by  this Nurse Health Advisor. No abnormalities found.   MMSE - Mini Mental State Exam 08/19/2018 05/27/2016  Orientation to time 5 5  Orientation to Place 5 5  Registration 3 3  Attention/ Calculation 5 4  Recall 1 2  Language- name 2 objects 2 2  Language- repeat 0 1  Language- follow 3 step command 3 3  Language- read & follow direction 1 1  Write a sentence 1 1  Copy design 1 1  Total score 27 28        Immunizations Immunization History  Administered Date(s) Administered   Influenza Split 05/13/2012   Influenza,inj,Quad PF,6+ Mos 05/30/2013, 04/05/2014, 03/13/2015, 02/19/2016, 02/16/2018, 03/06/2019   Influenza-Unspecified 04/08/2011   PFIZER(Purple Top)SARS-COV-2 Vaccination 09/18/2019, 10/11/2019, 05/24/2020   PNEUMOCOCCAL CONJUGATE-20 12/02/2020   Pneumococcal Polysaccharide-23 05/01/2015   Td 09/20/2006   Tdap 01/06/2017    TDAP status: Up to date  Flu Vaccine status: Up to date  Pneumococcal vaccine status: Up to date  Covid-19 vaccine status: Completed vaccines  Qualifies for Shingles Vaccine? Yes   Zostavax completed No   Shingrix Completed?: No.    Education has been provided regarding the importance of this vaccine. Patient has been advised to call insurance company to determine out of pocket expense if they have not yet received this vaccine. Advised may also receive vaccine at local pharmacy or Health Dept. Verbalized acceptance and understanding.  Screening Tests Health Maintenance  Topic Date Due   Pneumococcal Vaccine 52-37 Years old (1 - PCV)  Never done   Zoster Vaccines- Shingrix (1 of 2) Never done   OPHTHALMOLOGY EXAM  03/19/2020   COLONOSCOPY (Pts 45-31yrs Insurance coverage will need to be confirmed)  06/22/2020   COVID-19 Vaccine (4 - Booster for Pfizer series) 08/22/2020   FOOT EXAM  09/10/2020   INFLUENZA VACCINE  01/20/2021   HEMOGLOBIN A1C  06/03/2021   PAP SMEAR-Modifier  03/05/2022   MAMMOGRAM  12/11/2022   TETANUS/TDAP  01/07/2027   Hepatitis C Screening  Completed   HIV Screening  Completed   HPV VACCINES  Aged Out    Health Maintenance  Health Maintenance Due  Topic Date Due   Pneumococcal Vaccine 69-10 Years old (1 - PCV) Never done   Zoster Vaccines- Shingrix (1 of 2) Never done   OPHTHALMOLOGY EXAM  03/19/2020   COLONOSCOPY (Pts 45-58yrs Insurance coverage will need to be confirmed)  06/22/2020   COVID-19 Vaccine (4 - Booster for [place] series) 08/22/2020   FOOT EXAM  09/10/2020    Colorectal cancer screening: Scheduled for 01/2021  Mammogram status: Completed Bilateral 12/10/2020. Repeat every year  Bone Density status; Completed 12/10/2020. Results-Normal. Repeat in 2 years.   Lung Cancer Screening: (Low Dose CT Chest recommended if Age 22-80 years, 30 pack-year currently smoking OR have quit w/in 15years.) does not qualify.     Additional Screening:  Hepatitis C Screening:  Completed 03/13/2015  Vision Screening: Recommended annual ophthalmology exams for early detection  of glaucoma and other disorders of the eye. Is the patient up to date with their annual eye exam?  Yes  Who is the provider or what is the name of the office in which the patient attends annual eye exams? Pt unsure of name   Dental Screening: Recommended annual dental exams for proper oral hygiene  Community Resource Referral / Chronic Care Management: CRR required this visit?  Yes  For food assistance  CCM required this visit?  No      Plan:     I have personally reviewed and noted the following in the patient's  chart:   Medical and social history Use of alcohol, tobacco or illicit drugs  Current medications and supplements including opioid prescriptions.  Functional ability and status Nutritional status Physical activity Advanced directives List of other physicians Hospitalizations, surgeries, and ER visits in previous 12 months Vitals Screenings to include cognitive, depression, and falls Referrals and appointments  In addition, I have reviewed and discussed with patient certain preventive protocols, quality metrics, and best practice recommendations. A written personalized care plan for preventive services as well as general preventive health recommendations were provided to patient.   Due to this being a telephonic visit, the after visit summary with patients personalized plan was offered to patient via mail or my-chart. Patient declined at this time.   Marta Antu, LPN   [id number]/0/9811  Nurse Health Advisor  Nurse Notes: None    Medical screening examination/treatment/procedure(s) were performed by non-physician practitioner and as supervising provider I was immediately available for consultation/collaboration.  I agree with above. Marrian Salvage, FNP

## 2021-01-23 ENCOUNTER — Ambulatory Visit: Payer: Medicare HMO

## 2021-01-23 ENCOUNTER — Other Ambulatory Visit: Payer: Self-pay

## 2021-01-23 VITALS — Ht 63.0 in | Wt 171.0 lb

## 2021-01-23 DIAGNOSIS — Z1211 Encounter for screening for malignant neoplasm of colon: Secondary | ICD-10-CM

## 2021-01-23 MED ORDER — NA SULFATE-K SULFATE-MG SULF 17.5-3.13-1.6 GM/177ML PO SOLN: 1.0000 | Freq: Once | ORAL | 0 refills | Status: AC

## 2021-01-23 NOTE — Progress Notes (Signed)
No egg or soy allergy known to patient  No issues with past sedation with any surgeries or procedures Patient denies ever being told they had issues or difficulty with intubation  No FH of Malignant Hyperthermia No diet pills per patient No home 02 use per patient  No blood thinners per patient  Pt denies issues with constipation at this time; advised patient to increase fluids and eat fruits/veggies and exercise; pt reports she takes Dulcolax No A fib or A flutter   COVID 19 guidelines implemented in PV today with Pt and RN  Pt is fully vaccinated for Covid x 2+ booster;  NO PA's for preps discussed with pt In PV today  Discussed with pt there will be an out-of-pocket cost for prep and that varies from $0 to 70 dollars   Due to the COVID-19 pandemic we are asking patients to follow certain guidelines.  Pt aware of COVID protocols and [facility] guidelines

## 2021-01-29 ENCOUNTER — Other Ambulatory Visit: Payer: Self-pay | Admitting: Family Medicine

## 2021-01-29 DIAGNOSIS — E785 Hyperlipidemia, unspecified: Secondary | ICD-10-CM

## 2021-02-05 ENCOUNTER — Encounter: Payer: Self-pay | Admitting: Gastroenterology

## 2021-02-05 ENCOUNTER — Ambulatory Visit (AMBULATORY_SURGERY_CENTER): Payer: Medicare HMO | Admitting: Gastroenterology

## 2021-02-05 ENCOUNTER — Other Ambulatory Visit: Payer: Self-pay

## 2021-02-05 VITALS — BP 175/89 | HR 64 | Temp 98.0°F | Resp 11 | Ht 63.0 in | Wt 171.0 lb

## 2021-02-05 DIAGNOSIS — D125 Benign neoplasm of sigmoid colon: Secondary | ICD-10-CM

## 2021-02-05 DIAGNOSIS — D124 Benign neoplasm of descending colon: Secondary | ICD-10-CM | POA: Diagnosis not present

## 2021-02-05 DIAGNOSIS — D123 Benign neoplasm of transverse colon: Secondary | ICD-10-CM

## 2021-02-05 DIAGNOSIS — Z1211 Encounter for screening for malignant neoplasm of colon: Secondary | ICD-10-CM | POA: Diagnosis not present

## 2021-02-05 DIAGNOSIS — N183 Chronic kidney disease, stage 3 unspecified: Secondary | ICD-10-CM | POA: Diagnosis not present

## 2021-02-05 DIAGNOSIS — I129 Hypertensive chronic kidney disease with stage 1 through stage 4 chronic kidney disease, or unspecified chronic kidney disease: Secondary | ICD-10-CM | POA: Diagnosis not present

## 2021-02-05 DIAGNOSIS — E1122 Type 2 diabetes mellitus with diabetic chronic kidney disease: Secondary | ICD-10-CM | POA: Diagnosis not present

## 2021-02-05 DIAGNOSIS — K5730 Diverticulosis of large intestine without perforation or abscess without bleeding: Secondary | ICD-10-CM | POA: Diagnosis not present

## 2021-02-05 MED ORDER — SODIUM CHLORIDE 0.9 % IV SOLN: 500.0000 mL | Freq: Once | INTRAVENOUS | Status: DC

## 2021-02-05 NOTE — Progress Notes (Signed)
Pt's states no medical or surgical changes since previsit or office visit. VS assessed by C.W 

## 2021-02-05 NOTE — Patient Instructions (Signed)
Thank you for allowing [place] to care for you today!  Await final biopsy results, approximately 2 weeks.  Resume previous diet and medications today.  Return to your normal daily activities tomorrow..   YOU HAD AN ENDOSCOPIC PROCEDURE TODAY AT [facility]:   Refer to the procedure report that was given to you for any specific questions about what was found during the examination.  If the procedure report does not answer your questions, please call your gastroenterologist to clarify.  If you requested that your care partner not be given the details of your procedure findings, then the procedure report has been included in a sealed envelope for you to review at your convenience later.  YOU SHOULD EXPECT: Some feelings of bloating in the abdomen. Passage of more gas than usual.  Walking can help get rid of the air that was put into your GI tract during the procedure and reduce the bloating. If you had a lower endoscopy (such as a colonoscopy or flexible sigmoidoscopy) you may notice spotting of blood in your stool or on the toilet paper. If you underwent a bowel prep for your procedure, you may not have a normal bowel movement for a few days.  Please Note:  You might notice some irritation and congestion in your nose or some drainage.  This is from the oxygen used during your procedure.  There is no need for concern and it should clear up in a day or so.  SYMPTOMS TO REPORT IMMEDIATELY:  Following lower endoscopy (colonoscopy or flexible sigmoidoscopy):  Excessive amounts of blood in the stool  Significant tenderness or worsening of abdominal pains  Swelling of the abdomen that is new, acute  Fever of 100F or higher   For urgent or emergent issues, a gastroenterologist can be reached at any hour by calling [phone]. Do not use MyChart messaging for urgent concerns.    DIET:  We do recommend a small meal at first, but then you may proceed to your regular diet.  Drink  plenty of fluids but you should avoid alcoholic beverages for 24 hours.  ACTIVITY:  You should plan to take it easy for the rest of today and you should NOT DRIVE or use heavy machinery until tomorrow (because of the sedation medicines used during the test).    FOLLOW UP: Our staff will call the number listed on your records 48-72 hours following your procedure to check on you and address any questions or concerns that you may have regarding the information given to you following your procedure. If we do not reach you, we will leave a message.  We will attempt to reach you two times.  During this call, we will ask if you have developed any symptoms of COVID 19. If you develop any symptoms (ie: fever, flu-like symptoms, shortness of breath, cough etc.) before then, please call [phone].  If you test positive for Covid 19 in the 2 weeks post procedure, please call and report this information to [place].    If any biopsies were taken you will be contacted by phone or by letter within the next 1-3 weeks.  Please call us at [phone] if you have not heard about the biopsies in 3 weeks.    SIGNATURES/CONFIDENTIALITY: You and/or your care partner have signed paperwork which will be entered into your electronic medical record.  These signatures attest to the fact that that the information above on your After Visit Summary has been reviewed and is understood.  Full responsibility of the confidentiality of this discharge information lies with you and/or your care-partner.  

## 2021-02-05 NOTE — Op Note (Signed)
[facility] Patient Name: Caroline Simmons Procedure Date: 02/05/2021 9:56 AM MRN: [id number] Endoscopist: Gerrit Heck , MD Age: 65 Referring MD:  Date of Birth: 09-11-1955 Gender: Female Account #: [id number] Procedure:                Colonoscopy Indications:              Screening for colorectal malignant neoplasm (last                            colonoscopy was 10 years ago) Medicines:                Monitored Anesthesia Care Procedure:                Pre-Anesthesia Assessment:                           - Prior to the procedure, a History and Physical                            was performed, and patient medications and                            allergies were reviewed. The patient's tolerance of                            previous anesthesia was also reviewed. The risks                            and benefits of the procedure and the sedation                            options and risks were discussed with the patient.                            All questions were answered, and informed consent                            was obtained. Prior Anticoagulants: The patient has                            taken no previous anticoagulant or antiplatelet                            agents. ASA Grade Assessment: III - A patient with                            severe systemic disease. After reviewing the risks                            and benefits, the patient was deemed in                            satisfactory condition to undergo the procedure.  After obtaining informed consent, the colonoscope                            was passed under direct vision. Throughout the                            procedure, the patient's blood pressure, pulse, and                            oxygen saturations were monitored continuously. The                            CF [id number] was introduced through the anus                            and advanced to the the  cecum, identified by the                            appendiceal orifice, ileocecal valve and palpation.                            The colonoscopy was performed without difficulty.                            The patient tolerated the procedure well. The                            quality of the bowel preparation was good. The                            ileocecal valve, appendiceal orifice, and rectum                            were photographed. Scope In: 10:02:25 AM Scope Out: 10:26:04 AM Scope Withdrawal Time: 0 hours 17 minutes 29 seconds  Total Procedure Duration: 0 hours 23 minutes 39 seconds  Findings:                 Hemorrhoids were found on perianal exam.                           Two sessile polyps were found in the descending                            colon and transverse colon. The polyps were 4 to 8                            mm in size. These polyps were removed with a cold                            snare. Resection and retrieval were complete.                            Estimated blood loss was minimal.  Two sessile polyps were found in the sigmoid colon.                            The polyps were 4 to 6 mm in size. These polyps                            were removed with a cold snare. Resection and                            retrieval were complete. Estimated blood loss was                            minimal.                           A few small-mouthed diverticula were found in the                            sigmoid colon and cecum.                           The retroflexed view of the distal rectum and anal                            verge was normal and showed no anal or rectal                            abnormalities. Complications:            No immediate complications. Estimated Blood Loss:     Estimated blood loss was minimal. Impression:               - Hemorrhoids found on perianal exam.                           - Two 4 to 8 mm  polyps in the descending colon and                            in the transverse colon, removed with a cold snare.                            Resected and retrieved.                           - Two 4 to 6 mm polyps in the sigmoid colon,                            removed with a cold snare. Resected and retrieved.                           - Diverticulosis in the sigmoid colon and in the                            cecum.                           -  The distal rectum and anal verge are normal on                            retroflexion view. Recommendation:           - Patient has a contact number available for                            emergencies. The signs and symptoms of potential                            delayed complications were discussed with the                            patient. Return to normal activities tomorrow.                            Written discharge instructions were provided to the                            patient.                           - Resume previous diet.                           - Continue present medications.                           - Await pathology results.                           - Repeat colonoscopy in 3 - 5 years for                            surveillance based on pathology results.                           - Return to GI office PRN. Gerrit Heck, MD 02/05/2021 10:31:53 AM

## 2021-02-05 NOTE — Progress Notes (Signed)
Called to room to assist during endoscopic procedure.  Patient ID and intended procedure confirmed with present staff. Received instructions for my participation in the procedure from the performing physician.  

## 2021-02-05 NOTE — Progress Notes (Signed)
GASTROENTEROLOGY PROCEDURE H&P NOTE   Primary Care Physician: Darreld Mclean, MD    Reason for Procedure:   Colon cancer screening   Plan:    Colonoscopy  Patient is appropriate for endoscopic procedure(s) in the ambulatory ([facility]) setting.  The nature of the procedure, as well as the risks, benefits, and alternatives were carefully and thoroughly reviewed with the patient. Ample time for discussion and questions allowed. The patient understood, was satisfied, and agreed to proceed.     HPI: Caroline Simmons is a 64 y.o. female who presents for ongoing CRC screening. Last colonoscopy was 2012 and normal. No recent GI sxs.   Past Medical History:  Diagnosis Date   Anxiety    on meds   Arthritis    back   Cataract 2011   bilateral removal   Chronic kidney disease (CKD), stage III (moderate) ([place])    Depression    on meds   Essential hypertension, benign    on meds   GERD (gastroesophageal reflux disease)    on meds   Hyperlipidemia    on meds   Hypothyroid    on meds   Insomnia, unspecified    Menopause    lmp 06/2007   Neuropathy    per pt   Type II or unspecified type diabetes mellitus without mention of complication, not stated as uncontrolled    on meds   Vitamin D deficiency     Past Surgical History:  Procedure Laterality Date   CATARACT EXTRACTION, BILATERAL     CESAREAN SECTION     1982/1991   COLONOSCOPY  2012   [place]-F/V-movi(exc)-HPP   POLYPECTOMY  2012   HPP   RETINAL DETACHMENT REPAIR W/ SCLERAL BUCKLE LE  11/12/2011   WISDOM TOOTH EXTRACTION      Prior to Admission medications   Medication Sig Start Date End Date Taking? Authorizing Provider  ACCU-CHEK AVIVA PLUS test strip USE  1  TEST  STRIP EVERY DAY 10/07/18  Yes Copland, Gay Filler, MD  Alcohol Swabs (B-D SINGLE USE SWABS REGULAR) PADS USE  1  SWAB EVERY DAY 10/14/17  Yes Copland, Gay Filler, MD  amitriptyline (ELAVIL) 100 MG tablet TAKE 1 TABLET BY MOUTH EVERYDAY AT BEDTIME  08/19/20  Yes Patel, Donika K, DO  amitriptyline (ELAVIL) 25 MG tablet Take 1 tablet (25 mg total) by mouth daily. 08/19/20  Yes Patel, Donika K, DO  amLODipine (NORVASC) 2.5 MG tablet Take 1 tablet (2.5 mg total) by mouth daily. 12/27/19  Yes Copland, Gay Filler, MD  aspirin 81 MG tablet Take 81 mg by mouth daily.   Yes [provider]  carbamazepine (TEGRETOL) 200 MG tablet TAKE 2 TABLETS BY MOUTH EVERY MORNING AND 1 TABLET IN THE EVENING 09/14/19  Yes Narda Amber K, DO  finasteride (PROSCAR) 5 MG tablet  04/03/20  Yes [provider]  levothyroxine (SYNTHROID) 50 MCG tablet Take 1 tablet (50 mcg total) by mouth daily. 12/27/19  Yes Copland, Gay Filler, MD  lisinopril (ZESTRIL) 20 MG tablet TAKE 1 TABLET BY MOUTH EVERY DAY 09/10/20  Yes Copland, Gay Filler, MD  omeprazole (PRILOSEC) 20 MG capsule TAKE 1- 2 CAPSULES ONCE A DAY FOR NAUSEA AND REFLUX. USE AS NEEDED- DOES NOT HAVE TO TAKE EVERY DAY 03/04/20  Yes Copland, Gay Filler, MD  rosuvastatin (CRESTOR) 20 MG tablet TAKE 1 TABLET EVERY DAY 01/29/21  Yes Copland, Gay Filler, MD    Current Outpatient Medications  Medication Sig Dispense Refill   ACCU-CHEK AVIVA  PLUS test strip USE  1  TEST  STRIP EVERY DAY 100 each 5   Alcohol Swabs (B-D SINGLE USE SWABS REGULAR) PADS USE  1  SWAB EVERY DAY 100 each 3   amitriptyline (ELAVIL) 100 MG tablet TAKE 1 TABLET BY MOUTH EVERYDAY AT BEDTIME 90 tablet 3   amitriptyline (ELAVIL) 25 MG tablet Take 1 tablet (25 mg total) by mouth daily. 90 tablet 3   amLODipine (NORVASC) 2.5 MG tablet Take 1 tablet (2.5 mg total) by mouth daily. 90 tablet 3   aspirin 81 MG tablet Take 81 mg by mouth daily.     carbamazepine (TEGRETOL) 200 MG tablet TAKE 2 TABLETS BY MOUTH EVERY MORNING AND 1 TABLET IN THE EVENING 270 tablet 3   finasteride (PROSCAR) 5 MG tablet      levothyroxine (SYNTHROID) 50 MCG tablet Take 1 tablet (50 mcg total) by mouth daily. 90 tablet 3   lisinopril (ZESTRIL) 20 MG tablet TAKE 1 TABLET BY  MOUTH EVERY DAY 90 tablet 0   omeprazole (PRILOSEC) 20 MG capsule TAKE 1- 2 CAPSULES ONCE A DAY FOR NAUSEA AND REFLUX. USE AS NEEDED- DOES NOT HAVE TO TAKE EVERY DAY 180 capsule 1   rosuvastatin (CRESTOR) 20 MG tablet TAKE 1 TABLET EVERY DAY 90 tablet 1   Current Facility-Administered Medications  Medication Dose Route Frequency Provider Last Rate Last Admin   0.9 %  sodium chloride infusion  500 mL Intravenous Once Clytie Shetley V, DO        Allergies as of 02/05/2021 - Review Complete 02/05/2021  Allergen Reaction Noted   Codeine Nausea Only 08/11/2011   Zoloft  [sertraline hcl]     Lamotrigine Rash 12/21/2013    Family History  Problem Relation Age of Onset   Diabetes Mother        type 2  not sure onset   Diabetes Other    Hypertension Other    Colon cancer Neg Hx    Colon polyps Neg Hx    Esophageal cancer Neg Hx    Stomach cancer Neg Hx    Rectal cancer Neg Hx     Social History   Socioeconomic History   Marital status: Divorced    Spouse name: Not on file   Number of children: 3   Years of education: 14   Highest education level: Not on file  Occupational History   Occupation: disabled  Tobacco Use   Smoking status: Former    Packs/day: 1.00    Years: 3.00    Pack years: 3.00    Types: Cigarettes   Smokeless tobacco: Never   Tobacco comments:    quit 08/2003  Vaping Use   Vaping Use: Never used  Substance and Sexual Activity   Alcohol use: Not Currently   Drug use: No   Sexual activity: Not Currently  Other Topics Concern   Not on file  Social History Narrative   Lives alone in a 2 story home.  Has 3 children.  On disability.  Did work for [employer] for 29 years.  Education: some college.       Right handed    Social Determinants of Health   Financial Resource Strain: Medium Risk   Difficulty of Paying Living Expenses: Somewhat hard  Food Insecurity: Food Insecurity Present   Worried About Running Out of Food in the Last Year: Often  true   Ran Out of Food in the Last Year: Never true  Transportation Needs: No Transportation Needs  Lack of Transportation (Medical): No   Lack of Transportation (Non-Medical): No  Physical Activity: Insufficiently Active   Days of Exercise per Week: 5 days   Minutes of Exercise per Session: 20 min  Stress: No Stress Concern Present   Feeling of Stress : Only a little  Social Connections: Socially Isolated   Frequency of Communication with Friends and Family: More than three times a week   Frequency of Social Gatherings with Friends and Family: More than three times a week   Attends Religious Services: Never   Marine scientist or Organizations: No   Attends Music therapist: Never   Marital Status: Divorced  Human resources officer Violence: Not At Risk   Fear of Current or Ex-Partner: No   Emotionally Abused: No   Physically Abused: No   Sexually Abused: No    Physical Exam: Vital signs in last 24 hours: '@BP'$  (!) 171/89   Pulse 64   Temp 98 F (36.7 C) (Skin)   Resp 16   Ht '5\' 3"'$  (1.6 m)   Wt 171 lb (77.6 kg)   SpO2 100%   BMI 30.29 kg/m  GEN: NAD EYE: Sclerae anicteric ENT: MMM CV: Non-tachycardic Pulm: CTA b/l GI: Soft, NT/ND NEURO:  Alert & Oriented x [place], DO, [facility]   02/05/2021 9:56 AM

## 2021-02-05 NOTE — Progress Notes (Signed)
Report to PACU, RN, vss, BBS= Clear.  

## 2021-02-06 ENCOUNTER — Telehealth: Payer: Self-pay | Admitting: Family Medicine

## 2021-02-06 DIAGNOSIS — I10 Essential (primary) hypertension: Secondary | ICD-10-CM

## 2021-02-06 DIAGNOSIS — E034 Atrophy of thyroid (acquired): Secondary | ICD-10-CM

## 2021-02-06 MED ORDER — LEVOTHYROXINE SODIUM 50 MCG PO TABS: 50.0000 ug | ORAL_TABLET | Freq: Every day | ORAL | 1 refills | Status: DC

## 2021-02-06 MED ORDER — LISINOPRIL 20 MG PO TABS: 20.0000 mg | ORAL_TABLET | Freq: Every day | ORAL | 1 refills | Status: DC

## 2021-02-06 NOTE — Telephone Encounter (Signed)
Medication: levothyroxine (SYNTHROID) 50 MCG    lisinopril (ZESTRIL) 20 MG tablet   Has the patient contacted their pharmacy? Yes.   (If no, request that the patient contact the pharmacy for the refill.) (If yes, when and what did the pharmacy advise?)  Preferred Pharmacy (with phone number or street name):   [facility] - [address], [address]  [place], [place] [address]  Phone:  [phone]  Fax:  [phone]   Agent: Please be advised that RX refills may take up to 3 business days. We ask that you follow-up with your pharmacy.

## 2021-02-06 NOTE — Telephone Encounter (Signed)
Medication sent to pharmacy  

## 2021-02-07 ENCOUNTER — Telehealth: Payer: Self-pay | Admitting: *Deleted

## 2021-02-07 NOTE — Telephone Encounter (Signed)
Follow up call made. 

## 2021-02-07 NOTE — Telephone Encounter (Signed)
  Follow up Call-  Call back number 02/05/2021  Post procedure Call Back phone  # [phone]  Permission to leave phone message Yes  Some recent data might be hidden    no answer, no voicemail came up

## 2021-02-10 ENCOUNTER — Encounter: Payer: Self-pay | Admitting: Gastroenterology

## 2021-02-20 ENCOUNTER — Telehealth: Payer: Self-pay | Admitting: Family Medicine

## 2021-02-20 NOTE — Telephone Encounter (Signed)
   Telephone encounter was:  Unsuccessful.  02/20/2021 Name: Caroline Simmons MRN: [id number] DOB: December 15, 1955  Unsuccessful outbound call made today to assist with:  Food Insecurity  Outreach Attempt:  1st Attempt  A HIPAA compliant voice message was left requesting a return call.  Instructed patient to call back at [phone].  April Green Care Guide, Embedded Care Coordination [place], Care Management Phone: [phone] Email: [email or web address]

## 2021-03-03 DIAGNOSIS — L638 Other alopecia areata: Secondary | ICD-10-CM | POA: Diagnosis not present

## 2021-03-14 ENCOUNTER — Telehealth: Payer: Self-pay | Admitting: Family Medicine

## 2021-03-14 NOTE — Telephone Encounter (Signed)
   Telephone encounter was:  Unsuccessful.  03/14/2021 Name: Caroline Simmons MRN: [id number] DOB: 1956/03/01  Unsuccessful outbound call made today to assist with:  Food Insecurity  Outreach Attempt:  2nd Attempt  A HIPAA compliant voice message was left requesting a return call.  Instructed patient to call back at [phone].  April Green Care Guide, Embedded Care Coordination [place], Care Management Phone: [phone] Email: [email or web address]

## 2021-03-14 NOTE — Telephone Encounter (Signed)
Pt called PCP's office back stating someone had called her and left a message for her.  I advised patient of this information and gave her contact name and number to return call.

## 2021-03-14 NOTE — Telephone Encounter (Signed)
   Telephone encounter was:  Successful.  03/14/2021 Name: Niani Mourer MRN: [id number] DOB: 20-Feb-1956  Tiffannie Sloss is a 65 y.o. year old female who is a primary care patient of Copland, Gay Filler, MD . The community resource team was consulted for assistance with [place] guide performed the following interventions: Patient provided with information about care guide support team and interviewed to confirm resource needs Sent email, per patient, of local food resources .  Follow Up Plan:  No further follow up planned at this time. The patient has been provided with needed resources.  April Green Care Guide, Embedded Care Coordination [place], Care Management Phone: [phone] Email: [email or web address]

## 2021-03-20 DIAGNOSIS — N1832 Chronic kidney disease, stage 3b: Secondary | ICD-10-CM | POA: Diagnosis not present

## 2021-03-25 DIAGNOSIS — E1122 Type 2 diabetes mellitus with diabetic chronic kidney disease: Secondary | ICD-10-CM | POA: Diagnosis not present

## 2021-03-25 DIAGNOSIS — N1832 Chronic kidney disease, stage 3b: Secondary | ICD-10-CM | POA: Diagnosis not present

## 2021-03-25 DIAGNOSIS — Z87891 Personal history of nicotine dependence: Secondary | ICD-10-CM | POA: Diagnosis not present

## 2021-03-25 DIAGNOSIS — Z794 Long term (current) use of insulin: Secondary | ICD-10-CM | POA: Diagnosis not present

## 2021-03-25 DIAGNOSIS — I129 Hypertensive chronic kidney disease with stage 1 through stage 4 chronic kidney disease, or unspecified chronic kidney disease: Secondary | ICD-10-CM | POA: Diagnosis not present

## 2021-03-25 DIAGNOSIS — E1140 Type 2 diabetes mellitus with diabetic neuropathy, unspecified: Secondary | ICD-10-CM | POA: Diagnosis not present

## 2021-05-30 NOTE — Progress Notes (Addendum)
[facility] at [facility] [place], [place], [place] [address] [phone]  Date:  06/04/2021   Name:  Caroline Simmons   DOB:  [birth year]   MRN:  [id number]  PCP:  Darreld Mclean, MD    Chief Complaint: 6 month follow up (Concerns/ questions: some dizziness /Flu shot today: yes/)   History of Present Illness:  Caroline Simmons is a 65 y.o. very pleasant female patient who presents with the following:  Pt seen today for follow-up Last visit with myself in June History of diet controlled diabetes with neuropathy, hypertension, hyperlipidemia, chronic kidney disease  Nephrologist is Dr Audie Clear at [facility] She is also seen by neurology for neuropathy - will be seen next month  [facility] Eye exam-remind her to get annual eye exams Covid booster Foot exam due  Flu shot - do today  A1c is due, CMP due  Colonoscopy done in August, she was given 3 year follow-up   She is trying to lose weight but this is hard She is riding her exercise bike about 15 minutes a day  She does have significant neuropathy in both feet  Wt Readings from Last 3 Encounters:  06/04/21 170 lb 3.2 oz (77.2 kg)  02/05/21 171 lb (77.6 kg)  01/23/21 171 lb (77.6 kg)     Patient Active Problem List   Diagnosis Date Noted   Abnormal urinalysis 09/14/2019   Vitamin D deficiency 08/10/2017   Secondary hyperparathyroidism of renal origin ([place]) 07/30/2016   Chronic kidney disease, stage 3 ([place]) 04/08/2016   Dyslipidemia 04/02/2016   Proteinuria 04/02/2016   Fatigue 12/25/2013   Neuropathy 12/25/2013   Dyslipidemia associated with type 2 diabetes mellitus ([place]) 05/30/2013   Detached retina, left 11/25/2011   Depression with anxiety 11/25/2011   Neuropathy in diabetes ([place]) 09/15/2011   Insomnia, unspecified    Hypothyroidism    DIABETES MELLITUS, TYPE II 08/26/2010   Essential hypertension 08/26/2010   RECTAL PAIN 08/26/2010   Peripheral motor neuropathy  06/22/2009    Past Medical History:  Diagnosis Date   Anxiety    on meds   Arthritis    back   Cataract 2011   bilateral removal   Chronic kidney disease (CKD), stage III (moderate) ([place])    Depression    on meds   Essential hypertension, benign    on meds   GERD (gastroesophageal reflux disease)    on meds   Hyperlipidemia    on meds   Hypothyroid    on meds   Insomnia, unspecified    Menopause    lmp 06/2007   Neuropathy    per pt   Type II or unspecified type diabetes mellitus without mention of complication, not stated as uncontrolled    on meds   Vitamin D deficiency     Past Surgical History:  Procedure Laterality Date   CATARACT EXTRACTION, BILATERAL     CESAREAN SECTION     1982/1991   COLONOSCOPY  2012   [place]-F/V-movi(exc)-HPP   POLYPECTOMY  2012   HPP   RETINAL DETACHMENT REPAIR W/ SCLERAL BUCKLE LE  11/12/2011   WISDOM TOOTH EXTRACTION      Social History   Tobacco Use   Smoking status: Former    Packs/day: 1.00    Years: 3.00    Pack years: 3.00    Types: Cigarettes   Smokeless tobacco: Never   Tobacco comments:    quit 08/2003  Vaping Use  Vaping Use: Never used  Substance Use Topics   Alcohol use: Not Currently   Drug use: No    Family History  Problem Relation Age of Onset   Diabetes Mother        type 2  not sure onset   Diabetes Other    Hypertension Other    Colon cancer Neg Hx    Colon polyps Neg Hx    Esophageal cancer Neg Hx    Stomach cancer Neg Hx    Rectal cancer Neg Hx     Allergies  Allergen Reactions   Codeine Nausea Only    Pt states she has taken this with no problems.   Zoloft  [Sertraline Hcl]    Lamotrigine Rash    Other reaction(s): RASH    Medication list has been reviewed and updated.  Current Outpatient Medications on File Prior to Visit  Medication Sig Dispense Refill   ACCU-CHEK AVIVA PLUS test strip USE  1  TEST  STRIP EVERY DAY 100 each 5   Alcohol Swabs (B-D SINGLE USE SWABS  REGULAR) PADS USE  1  SWAB EVERY DAY 100 each 3   amitriptyline (ELAVIL) 100 MG tablet TAKE 1 TABLET BY MOUTH EVERYDAY AT BEDTIME 90 tablet 3   amitriptyline (ELAVIL) 25 MG tablet Take 1 tablet (25 mg total) by mouth daily. 90 tablet 3   aspirin 81 MG tablet Take 81 mg by mouth daily.     carbamazepine (TEGRETOL) 200 MG tablet TAKE 2 TABLETS BY MOUTH EVERY MORNING AND 1 TABLET IN THE EVENING 270 tablet 3   finasteride (PROSCAR) 5 MG tablet      omeprazole (PRILOSEC) 20 MG capsule TAKE 1- 2 CAPSULES ONCE A DAY FOR NAUSEA AND REFLUX. USE AS NEEDED- DOES NOT HAVE TO TAKE EVERY DAY 180 capsule 1   rosuvastatin (CRESTOR) 20 MG tablet TAKE 1 TABLET EVERY DAY 90 tablet 1   No current facility-administered medications on file prior to visit.    Review of Systems:  As per HPI- otherwise negative.   Physical Examination: Vitals:   06/04/21 1317  BP: 124/76  Pulse: 95  Resp: 18  Temp: 97.6 F (36.4 C)  SpO2: 100%   Vitals:   06/04/21 1317  Weight: 170 lb 3.2 oz (77.2 kg)  Height: 5\' 3"  (1.6 m)   Body mass index is 30.15 kg/m. Ideal Body Weight: Weight in (lb) to have BMI = 25: 140.8  GEN: no acute distress.  Mild obesity, looks well and her normal self HEENT: Atraumatic, Normocephalic.  Bilateral TM wnl, oropharynx normal.  PEERL,EOMI.   Ears and Nose: No external deformity. CV: RRR, No M/G/R. No JVD. No thrill. No extra heart sounds. PULM: CTA B, no wheezes, crackles, rhonchi. No retractions. No resp. distress. No accessory muscle use. ABD: S, NT, ND, +BS. No rebound. No HSM. EXTR: No c/c/e PSYCH: Normally interactive. Conversant.  Foot exam: normal pulses but cannot sense any monofilament   Assessment and Plan: Hypothyroidism due to acquired atrophy of thyroid - Plan: levothyroxine (SYNTHROID) 50 MCG tablet, TSH  Essential hypertension - Plan: lisinopril (ZESTRIL) 20 MG tablet, amLODipine (NORVASC) 2.5 MG tablet, CBC, Comprehensive metabolic panel  Controlled type 2 diabetes  mellitus with diabetic polyneuropathy, without long-term current use of insulin (HCC) - Plan: Hemoglobin A1c  Dyslipidemia  Need for influenza vaccination - Plan: Flu Vaccine QUAD 6+ mos PF IM (Fluarix Quad PF)  Patient seen today for follow-up.  Refill levothyroxine, check TSH Blood pressure under good control.  Check labs, continue medications which were refilled Diabetes is currently diet controlled.  She does have neuropathy but her diabetes has been under good control.  Check A1c    Signed Lamar Blinks, MD  Addendum 12/15, received labs as below.  Letter to patient Results for orders placed or performed in visit on 06/04/21  CBC  Result Value Ref Range   WBC 6.0 4.0 - 10.5 K/uL   RBC 4.09 3.87 - 5.11 Mil/uL   Platelets 228.0 150.0 - 400.0 K/uL   Hemoglobin 12.5 12.0 - 15.0 g/dL   HCT 37.6 36.0 - 46.0 %   MCV 92.0 78.0 - 100.0 fl   MCHC 33.3 30.0 - 36.0 g/dL   RDW 13.3 11.5 - 15.5 %  Comprehensive metabolic panel  Result Value Ref Range   Sodium 139 135 - 145 mEq/L   Potassium 4.6 3.5 - 5.1 mEq/L   Chloride 105 96 - 112 mEq/L   CO2 25 19 - 32 mEq/L   Glucose, Bld 152 (H) 70 - 99 mg/dL   BUN 26 (H) 6 - 23 mg/dL   Creatinine, Ser 1.55 (H) 0.40 - 1.20 mg/dL   Total Bilirubin 0.3 0.2 - 1.2 mg/dL   Alkaline Phosphatase 76 39 - 117 U/L   AST 18 0 - 37 U/L   ALT 18 0 - 35 U/L   Total Protein 7.0 6.0 - 8.3 g/dL   Albumin 4.4 3.5 - 5.2 g/dL   GFR 35.13 (L) >60.00 mL/min   Calcium 9.4 8.4 - 10.5 mg/dL  Hemoglobin A1c  Result Value Ref Range   Hgb A1c MFr Bld 7.0 (H) 4.6 - 6.5 %  TSH  Result Value Ref Range   TSH 1.83 0.35 - 5.50 uIU/mL

## 2021-06-04 ENCOUNTER — Ambulatory Visit (INDEPENDENT_AMBULATORY_CARE_PROVIDER_SITE_OTHER): Payer: Medicare HMO | Admitting: Family Medicine

## 2021-06-04 ENCOUNTER — Encounter: Payer: Self-pay | Admitting: Family Medicine

## 2021-06-04 VITALS — BP 124/76 | HR 95 | Temp 97.6°F | Resp 18 | Ht 63.0 in | Wt 170.2 lb

## 2021-06-04 DIAGNOSIS — Z23 Encounter for immunization: Secondary | ICD-10-CM

## 2021-06-04 DIAGNOSIS — E1142 Type 2 diabetes mellitus with diabetic polyneuropathy: Secondary | ICD-10-CM

## 2021-06-04 DIAGNOSIS — I10 Essential (primary) hypertension: Secondary | ICD-10-CM | POA: Diagnosis not present

## 2021-06-04 DIAGNOSIS — E785 Hyperlipidemia, unspecified: Secondary | ICD-10-CM

## 2021-06-04 DIAGNOSIS — E034 Atrophy of thyroid (acquired): Secondary | ICD-10-CM

## 2021-06-04 LAB — COMPREHENSIVE METABOLIC PANEL
ALT: 18 U/L (ref 0–35)
AST: 18 U/L (ref 0–37)
Albumin: 4.4 g/dL (ref 3.5–5.2)
Alkaline Phosphatase: 76 U/L (ref 39–117)
BUN: 26 mg/dL — ABNORMAL HIGH (ref 6–23)
CO2: 25 mEq/L (ref 19–32)
Calcium: 9.4 mg/dL (ref 8.4–10.5)
Chloride: 105 mEq/L (ref 96–112)
Creatinine, Ser: 1.55 mg/dL — ABNORMAL HIGH (ref 0.40–1.20)
GFR: 35.13 mL/min — ABNORMAL LOW (ref >60.00)
Glucose, Bld: 152 mg/dL — ABNORMAL HIGH (ref 70–99)
Potassium: 4.6 mEq/L (ref 3.5–5.1)
Sodium: 139 mEq/L (ref 135–145)
Total Bilirubin: 0.3 mg/dL (ref 0.2–1.2)
Total Protein: 7 g/dL (ref 6.0–8.3)

## 2021-06-04 LAB — CBC
HCT: 37.6 % (ref 36.0–46.0)
Hemoglobin: 12.5 g/dL (ref 12.0–15.0)
MCHC: 33.3 g/dL (ref 30.0–36.0)
MCV: 92 fl (ref 78.0–100.0)
Platelets: 228 10*3/uL (ref 150.0–400.0)
RBC: 4.09 Mil/uL (ref 3.87–5.11)
RDW: 13.3 % (ref 11.5–15.5)
WBC: 6 10*3/uL (ref 4.0–10.5)

## 2021-06-04 LAB — HEMOGLOBIN A1C: Hgb A1c MFr Bld: 7 % — ABNORMAL HIGH (ref 4.6–6.5)

## 2021-06-04 MED ORDER — AMLODIPINE BESYLATE 2.5 MG PO TABS: 2.5000 mg | ORAL_TABLET | Freq: Every day | ORAL | 3 refills | Status: DC

## 2021-06-04 MED ORDER — LISINOPRIL 20 MG PO TABS: 20.0000 mg | ORAL_TABLET | Freq: Every day | ORAL | 3 refills | Status: DC

## 2021-06-04 MED ORDER — LEVOTHYROXINE SODIUM 50 MCG PO TABS
50.0000 ug | ORAL_TABLET | Freq: Every day | ORAL | 3 refills | Status: DC
Start: 2021-06-04 — End: 2022-06-04

## 2021-06-04 NOTE — Patient Instructions (Signed)
It was nice to see you today- assuming all is well please see me in about 6 months  Flu shot given I recommend getting a covid booster and shingles vaccine series at your pharmacy if not done already Also remember you annual eye exam!    I will be in touch with your labs asap

## 2021-06-05 LAB — TSH: TSH: 1.83 u[IU]/mL (ref 0.35–5.50)

## 2021-06-28 ENCOUNTER — Other Ambulatory Visit: Payer: Self-pay | Admitting: Neurology

## 2021-07-25 DIAGNOSIS — N1832 Chronic kidney disease, stage 3b: Secondary | ICD-10-CM | POA: Diagnosis not present

## 2021-07-30 DIAGNOSIS — I129 Hypertensive chronic kidney disease with stage 1 through stage 4 chronic kidney disease, or unspecified chronic kidney disease: Secondary | ICD-10-CM | POA: Diagnosis not present

## 2021-07-30 DIAGNOSIS — Z794 Long term (current) use of insulin: Secondary | ICD-10-CM | POA: Diagnosis not present

## 2021-07-30 DIAGNOSIS — E1122 Type 2 diabetes mellitus with diabetic chronic kidney disease: Secondary | ICD-10-CM | POA: Diagnosis not present

## 2021-07-30 DIAGNOSIS — N1832 Chronic kidney disease, stage 3b: Secondary | ICD-10-CM | POA: Diagnosis not present

## 2021-07-30 DIAGNOSIS — Z87891 Personal history of nicotine dependence: Secondary | ICD-10-CM | POA: Diagnosis not present

## 2021-07-30 DIAGNOSIS — E872 Acidosis, unspecified: Secondary | ICD-10-CM | POA: Diagnosis not present

## 2021-08-22 ENCOUNTER — Ambulatory Visit: Payer: Medicare HMO | Admitting: Neurology

## 2021-08-22 ENCOUNTER — Encounter: Payer: Self-pay | Admitting: Neurology

## 2021-08-22 ENCOUNTER — Other Ambulatory Visit: Payer: Self-pay

## 2021-08-22 DIAGNOSIS — E1142 Type 2 diabetes mellitus with diabetic polyneuropathy: Secondary | ICD-10-CM

## 2021-08-22 MED ORDER — CARBAMAZEPINE 200 MG PO TABS: 200.0000 mg | ORAL_TABLET | Freq: Two times a day (BID) | ORAL | 3 refills | Status: DC

## 2021-08-22 MED ORDER — AMITRIPTYLINE HCL 25 MG PO TABS: 25.0000 mg | ORAL_TABLET | Freq: Every day | ORAL | 3 refills | Status: DC

## 2021-08-22 MED ORDER — AMITRIPTYLINE HCL 100 MG PO TABS: ORAL_TABLET | ORAL | 3 refills | Status: DC

## 2021-08-22 NOTE — Progress Notes (Addendum)
Therapist, music at [employer] ?[address], [address] ?[place], [place] [address] ?336 [phone] ?Fax [phone] ? ?Date:  08/25/2021  ? ?Name:  Caroline Simmons   DOB:  1956-04-24   MRN:  [id number] ? ?PCP:  Darreld Mclean, MD  ? ? ?Chief Complaint: right ankle swelling  (Couple months and worse 1 week) ? ? ?History of Present Illness: ? ?Caroline Simmons is a 66 y.o. very pleasant female patient who presents with the following: ? ?Pt seen today with an ankle issue ?Last seen by myself in December  ?History of diet controlled diabetes with neuropathy, hypertension, hyperlipidemia, chronic kidney disease ? ?Seen by her neurologist Dr Posey Pronto last week: ?IMPRESSION/PLAN: ?Painful diabetic neuropathy, small fiber - controlled on medication ?             - Previously tried: Lyrica, gabapentin, Cymbalta, Lamictal, and Dilantin ?             - Continue amitriptyline 25mg  in the morning and 100mg  at bedtime ?             - Reduce carbamazepine to 200mg  twice daily ?             - Fall precautions discussed ? ? ?Eye exam-patient to keep up-to-date ?Lab Results  ?Component Value Date  ? HGBA1C 7.0 (H) 06/04/2021  ? ?She notes right ankle pain for about 2 months ?The left is ok ?NKI that she can recall. ?She thinks it is getting worse ?It is worse after she walks on it- she does try to walk for exercise 15- 20 minutes a day.  However, this exercise makes her ankle hurt worse after she completes her walk ?She has tried some Tylenol which does seemed to help.  She is not aware of any history of gout.  She has noted some lateral ankle swelling  ? ?BP Readings from Last 3 Encounters:  ?08/25/21 (!) 152/100  ?08/22/21 (!) 147/86  ?06/04/21 124/76  ? ? ?Wt Readings from Last 3 Encounters:  ?08/25/21 172 lb (78 kg)  ?08/22/21 175 lb (79.4 kg)  ?06/04/21 170 lb 3.2 oz (77.2 kg)  ? ? ? ?Patient Active Problem List  ? Diagnosis Date Noted  ? Abnormal urinalysis 09/14/2019  ? Vitamin D deficiency 08/10/2017  ? Secondary  hyperparathyroidism of renal origin ([place]) 07/30/2016  ? Chronic kidney disease, stage 3 ([place]) 04/08/2016  ? Dyslipidemia 04/02/2016  ? Proteinuria 04/02/2016  ? Fatigue 12/25/2013  ? Neuropathy 12/25/2013  ? Dyslipidemia associated with type 2 diabetes mellitus ([place]) 05/30/2013  ? Detached retina, left 11/25/2011  ? Depression with anxiety 11/25/2011  ? Neuropathy in diabetes ([place]) 09/15/2011  ? Insomnia, unspecified   ? Hypothyroidism   ? DIABETES MELLITUS, TYPE II 08/26/2010  ? Essential hypertension 08/26/2010  ? RECTAL PAIN 08/26/2010  ? Peripheral motor neuropathy 06/22/2009  ? ? ?Past Medical History:  ?Diagnosis Date  ? Alopecia   ? Anxiety   ? on meds  ? Arthritis   ? back  ? Cataract 2011  ? bilateral removal  ? Chronic kidney disease (CKD), stage III (moderate) (HCC)   ? Depression   ? on meds  ? Essential hypertension, benign   ? on meds  ? GERD (gastroesophageal reflux disease)   ? on meds  ? Hyperlipidemia   ? on meds  ? Hypothyroid   ? on meds  ? Insomnia, unspecified   ? Menopause   ? lmp 06/2007  ? Neuropathy   ?  per pt  ? Type II or unspecified type diabetes mellitus without mention of complication, not stated as uncontrolled   ? on meds  ? Vitamin D deficiency   ? ? ?Past Surgical History:  ?Procedure Laterality Date  ? CATARACT EXTRACTION, BILATERAL    ? CESAREAN SECTION    ? 1982/1991  ? COLONOSCOPY  2012  ? [facility]-F/V-movi(exc)-HPP  ? POLYPECTOMY  2012  ? HPP  ? RETINAL DETACHMENT REPAIR W/ SCLERAL BUCKLE LE  11/12/2011  ? WISDOM TOOTH EXTRACTION    ? ? ?Social History  ? ?Tobacco Use  ? Smoking status: Former  ?  Packs/day: 1.00  ?  Years: 3.00  ?  Pack years: 3.00  ?  Types: Cigarettes  ? Smokeless tobacco: Never  ? Tobacco comments:  ?  quit 08/2003  ?Vaping Use  ? Vaping Use: Never used  ?Substance Use Topics  ? Alcohol use: Not Currently  ? Drug use: No  ? ? ?Family History  ?Problem Relation Age of Onset  ? Diabetes Mother   ?     type 2  not sure onset  ? Diabetes Other   ?  Hypertension Other   ? Colon cancer Neg Hx   ? Colon polyps Neg Hx   ? Esophageal cancer Neg Hx   ? Stomach cancer Neg Hx   ? Rectal cancer Neg Hx   ? ? ?Allergies  ?Allergen Reactions  ? Codeine Nausea Only  ?  Pt states she has taken this with no problems.  ? Zoloft  [Sertraline Hcl]   ? Lamotrigine Rash  ?  Other reaction(s): RASH  ? ? ?Medication list has been reviewed and updated. ? ?Current Outpatient Medications on File Prior to Visit  ?Medication Sig Dispense Refill  ? ACCU-CHEK AVIVA PLUS test strip USE  1  TEST  STRIP EVERY DAY 100 each 5  ? Alcohol Swabs (B-D SINGLE USE SWABS REGULAR) PADS USE  1  SWAB EVERY DAY 100 each 3  ? amitriptyline (ELAVIL) 100 MG tablet TAKE 1 TABLET BY MOUTH EVERYDAY AT BEDTIME 90 tablet 3  ? amitriptyline (ELAVIL) 25 MG tablet Take 1 tablet (25 mg total) by mouth daily. 90 tablet 3  ? amLODipine (NORVASC) 2.5 MG tablet Take 1 tablet (2.5 mg total) by mouth daily. 90 tablet 3  ? aspirin 81 MG tablet Take 81 mg by mouth daily.    ? carbamazepine (TEGRETOL) 200 MG tablet Take 1 tablet (200 mg total) by mouth in the morning and at bedtime. 180 tablet 3  ? finasteride (PROSCAR) 5 MG tablet     ? levothyroxine (SYNTHROID) 50 MCG tablet Take 1 tablet (50 mcg total) by mouth daily. 90 tablet 3  ? lisinopril (ZESTRIL) 20 MG tablet Take 1 tablet (20 mg total) by mouth daily. 90 tablet 3  ? omeprazole (PRILOSEC) 20 MG capsule TAKE 1- 2 CAPSULES ONCE A DAY FOR NAUSEA AND REFLUX. USE AS NEEDED- DOES NOT HAVE TO TAKE EVERY DAY 180 capsule 1  ? rosuvastatin (CRESTOR) 20 MG tablet TAKE 1 TABLET EVERY DAY 90 tablet 1  ? sodium bicarbonate 650 MG tablet Take 650 mg by mouth 2 (two) times daily.    ? ?No current facility-administered medications on file prior to visit.  ? ? ?Review of Systems: ? ?As per HPI- otherwise negative. ? ? ?Physical Examination: ?Vitals:  ? 08/25/21 1341  ?BP: (!) 152/100  ?Pulse: 92  ?Temp: 98.4 ?F (36.9 ?C)  ?SpO2: 97%  ? ?Vitals:  ?  08/25/21 1341  ?Weight: 172 lb (78  kg)  ?Height: 5\' 3"  (1.6 m)  ? ?Body mass index is 30.47 kg/m?. ?Ideal Body Weight: Weight in (lb) to have BMI = 25: 140.8 ? ?GEN: no acute distress.  Mildly obese, looks well and her normal self ?HEENT: Atraumatic, Normocephalic.  ?Ears and Nose: No external deformity. ?CV: RRR, No M/G/R. No JVD. No thrill. No extra heart sounds. ?PULM: CTA B, no wheezes, crackles, rhonchi. No retractions. No resp. distress. No accessory muscle use. ?EXTR: No c/c/e ?PSYCH: Normally interactive. Conversant.  ?Tight Achilles tendons are noted bilaterally.  She has minimal swelling at the right lateral ankle.  She is mildly tender over the lateral ankle, but not as tender as I would have otherwise expected.  Normal pulses and circulation of both feet.  No skin breakdown or ulcerations ? ?Assessment and Plan: ?Acute right ankle pain - Plan: DG Ankle Complete Right, Uric acid ? ?Patient is seen today with concern of right ankle pain.  She cannot recall any particular injury, she thinks perhaps she sprained it but cannot really remember.  She has noted pain for 2 to 3 weeks, worse after exercise.  Her exam seems most consistent with a sprain.  Also consider gout, will check uric acid ?We will also obtain an ankle film ?Suggest compression, continue Tylenol as needed ?I will be in touch with her pending her results, orthopedics referral may be warranted ? ?Signed ?Lamar Blinks, MD ? ?Addnd 3/7- received labs and x-ray report as below ?Called patient-gave her results as below.  Referral made to [facility] medicine at [facility] ? ?DG Ankle Complete Right ? ?Result Date: 08/25/2021 ?CLINICAL DATA:  Right ankle pain, primarily at lateral malleolus. EXAM: RIGHT ANKLE - COMPLETE 3+ VIEW COMPARISON:  None. FINDINGS: There is no evidence of fracture, dislocation, or joint effusion. There is no evidence of arthropathy or other focal bone abnormality. Soft tissues are unremarkable. IMPRESSION: Negative. Electronically Signed   By: Donavan Foil M.D.    On: 08/25/2021 23:37   ? ?Results for orders placed or performed in visit on 08/25/21  ?Uric acid  ?Result Value Ref Range  ? Uric Acid, Serum 5.5 2.4 - 7.0 mg/dL  ? ? ? ?

## 2021-08-22 NOTE — Progress Notes (Signed)
? ? ?Follow-up Visit ? ? ?Date: 08/22/21 ?  ? ?Caroline Simmons ?MRN: [id number] ?DOB: 13-May-1956 ? ? ?Interim History: ?Caroline Simmons is a 66 y.o. right-handed African American female with type 2 diabetes mellitus complicated by stage III CKD and neuropathy, hypertension, and hypothyroidism returning to the clinic for follow-up of painful diabetic neuropathy.  The patient was accompanied to the clinic by self. ? ?History of present illness: ?Starting around 2008, she started having numbness, burning, and tingling of the feet. Her pain is above the level of the ankles and constant.  She denies any imbalance and walks unassisted.  She has mild tingling in the left hand, which is intermittent.  She has noticed greater difficulty with opening jars and holding onto objects.   She is trying very hard to keep her diabetes controlled with diet, last HbA1c 6.4.  ?  ?She was followed by Dr. Rema Jasmine at [facility].  Per  records, she had electrodiagnostic testing in March 2015 which did not show large fiber neuropathy.  She has previously tried Lyrica, gabapentin, cymbalta, lamictal, and dilantin.  She has the greatest benefit with amitriptyline 100 mg at bedtime and Tegretol 400 mg in the morning and 200 mg at bedtime.  She was on a higher dose of Tegretol in the past, however, due to her renal function and cognitive side effects, but the dose was reduced. For severe pain, she takes hydrocodone 1-2 times per week.  She did not wish to see pain management in the past, when it was recommended.   ? ?UPDATE 08/19/2020: She is here for 1 year follow-up and requesting refills for amitriptyline which she takes 25mg  in the morning and 100mg  at bedtime + carbamazepine 400mg  in the morning nad 200mg  at bedtime. This regimen continues to control her pain adequately.  There has not been any progression of neuropathy. She continues to have some imbalance and found that home PT significantly helped. She is compliant with  doing home exercises. ? ?UPDATE 08/22/2021:  She is here for follow-up visit.  She reports that her neuropathy has been stable and pain is well-controlled on carbamazepine and amitriptyline.  Sometimes, she only takes carbamazepine 200mg  twice daily (not 400mg  in the morning) and feels that pain is still controlled.  She is trying to walk more and balance is good.  She had two falls last year after, one from tripping over house plant. Last HbA1c 7.0. ? ?Medications:  ?Current Outpatient Medications on File Prior to Visit  ?Medication Sig Dispense Refill  ? ACCU-CHEK AVIVA PLUS test strip USE  1  TEST  STRIP EVERY DAY 100 each 5  ? Alcohol Swabs (B-D SINGLE USE SWABS REGULAR) PADS USE  1  SWAB EVERY DAY 100 each 3  ? amLODipine (NORVASC) 2.5 MG tablet Take 1 tablet (2.5 mg total) by mouth daily. 90 tablet 3  ? aspirin 81 MG tablet Take 81 mg by mouth daily.    ? finasteride (PROSCAR) 5 MG tablet     ? levothyroxine (SYNTHROID) 50 MCG tablet Take 1 tablet (50 mcg total) by mouth daily. 90 tablet 3  ? lisinopril (ZESTRIL) 20 MG tablet Take 1 tablet (20 mg total) by mouth daily. 90 tablet 3  ? omeprazole (PRILOSEC) 20 MG capsule TAKE 1- 2 CAPSULES ONCE A DAY FOR NAUSEA AND REFLUX. USE AS NEEDED- DOES NOT HAVE TO TAKE EVERY DAY 180 capsule 1  ? rosuvastatin (CRESTOR) 20 MG tablet TAKE 1 TABLET EVERY DAY 90 tablet 1  ? ?  No current facility-administered medications on file prior to visit.  ? ? ?Allergies:  ?Allergies  ?Allergen Reactions  ? Codeine Nausea Only  ?  Pt states she has taken this with no problems.  ? Zoloft  [Sertraline Hcl]   ? Lamotrigine Rash  ?  Other reaction(s): RASH  ? ? ?Vital Signs:  ?BP (!) 147/86   Pulse 85   Ht 5\' 3"  (1.6 m)   Wt 175 lb (79.4 kg)   SpO2 98%   BMI 31.00 kg/m?  ? ? ?Neurological Exam: ?MENTAL STATUS including orientation to time, place, person, recent and remote memory, attention span and concentration, language, and fund of knowledge is normal.  Speech is not  dysarthric. ? ?MOTOR:  Motor strength is 5/5 in all extremities, except 5-/5 distal hand and toe weakness.  No atrophy, fasciculations or abnormal movements.  No pronator drift.  Tone is normal.   ? ?MSRs:  Reflexes are 2+/4 throughout, except absent Achilles. ? ?SENSORY:  Absent vibration distal to ankles bilaterally, normal at the knees and hands.  ? ?COORDINATION/GAIT:  Normal finger-to- nose-finger. Gait narrow based and stable, slow.  ? ?Data: ?Lab Results  ?Component Value Date  ? TSH 1.83 06/04/2021  ? ?Lab Results  ?Component Value Date  ? HGBA1C 7.0 (H) 06/04/2021  ? ? ?IMPRESSION/PLAN: ?Painful diabetic neuropathy, small fiber - controlled on medication ? - Previously tried: Lyrica, gabapentin, Cymbalta, Lamictal, and Dilantin ? - Continue amitriptyline 25mg  in the morning and 100mg  at bedtime ? - Reduce carbamazepine to 200mg  twice daily ? - Fall precautions discussed ? ?Return to clinic in 1 year   ? ?Thank you for allowing me to participate in patient's care.  If I can answer any additional questions, I would be pleased to do so.   ? ?Sincerely, ? ? ? ?Johann Gascoigne K. Posey Pronto, DO ? ? ?

## 2021-08-22 NOTE — Patient Instructions (Signed)
Reduce carbamazepine to 200mg  twice daily ? ?Continue amitriptyline 25mg  in the morning and 100mg  at bedtime ? ?Return to clinic in 1 year ?

## 2021-08-22 NOTE — Patient Instructions (Addendum)
Good to see you again today  ?I will be in touch with your x-rays and your uric acid asap  ?We will likely need to have you see a sports med doctor or orthopedist for your ankle ?Tylenol is ok to use as needed!   ?You might also want to try an ankle compression sleeve for support  ?

## 2021-08-25 ENCOUNTER — Encounter: Payer: Self-pay | Admitting: Family Medicine

## 2021-08-25 ENCOUNTER — Other Ambulatory Visit: Payer: Self-pay

## 2021-08-25 ENCOUNTER — Ambulatory Visit (INDEPENDENT_AMBULATORY_CARE_PROVIDER_SITE_OTHER): Payer: Medicare HMO | Admitting: Family Medicine

## 2021-08-25 ENCOUNTER — Ambulatory Visit (HOSPITAL_BASED_OUTPATIENT_CLINIC_OR_DEPARTMENT_OTHER)
Admission: RE | Admit: 2021-08-25 | Discharge: 2021-08-25 | Disposition: A | Discharge status: Home / Self Care | Payer: Medicare HMO | Source: Ambulatory Visit | Attending: Family Medicine | Admitting: Family Medicine

## 2021-08-25 VITALS — BP 152/100 | HR 92 | Temp 98.4°F | Ht 63.0 in | Wt 172.0 lb

## 2021-08-25 DIAGNOSIS — M25571 Pain in right ankle and joints of right foot: Secondary | ICD-10-CM

## 2021-08-25 IMAGING — DX DG ANKLE COMPLETE 3+V*R*
3 series · 3 of 3 positions shown · non-contrast
Comparison: None.

CLINICAL DATA: Right ankle pain, primarily at lateral malleolus.

EXAM:
RIGHT ANKLE - COMPLETE 3+ VIEW

[ankle ap]
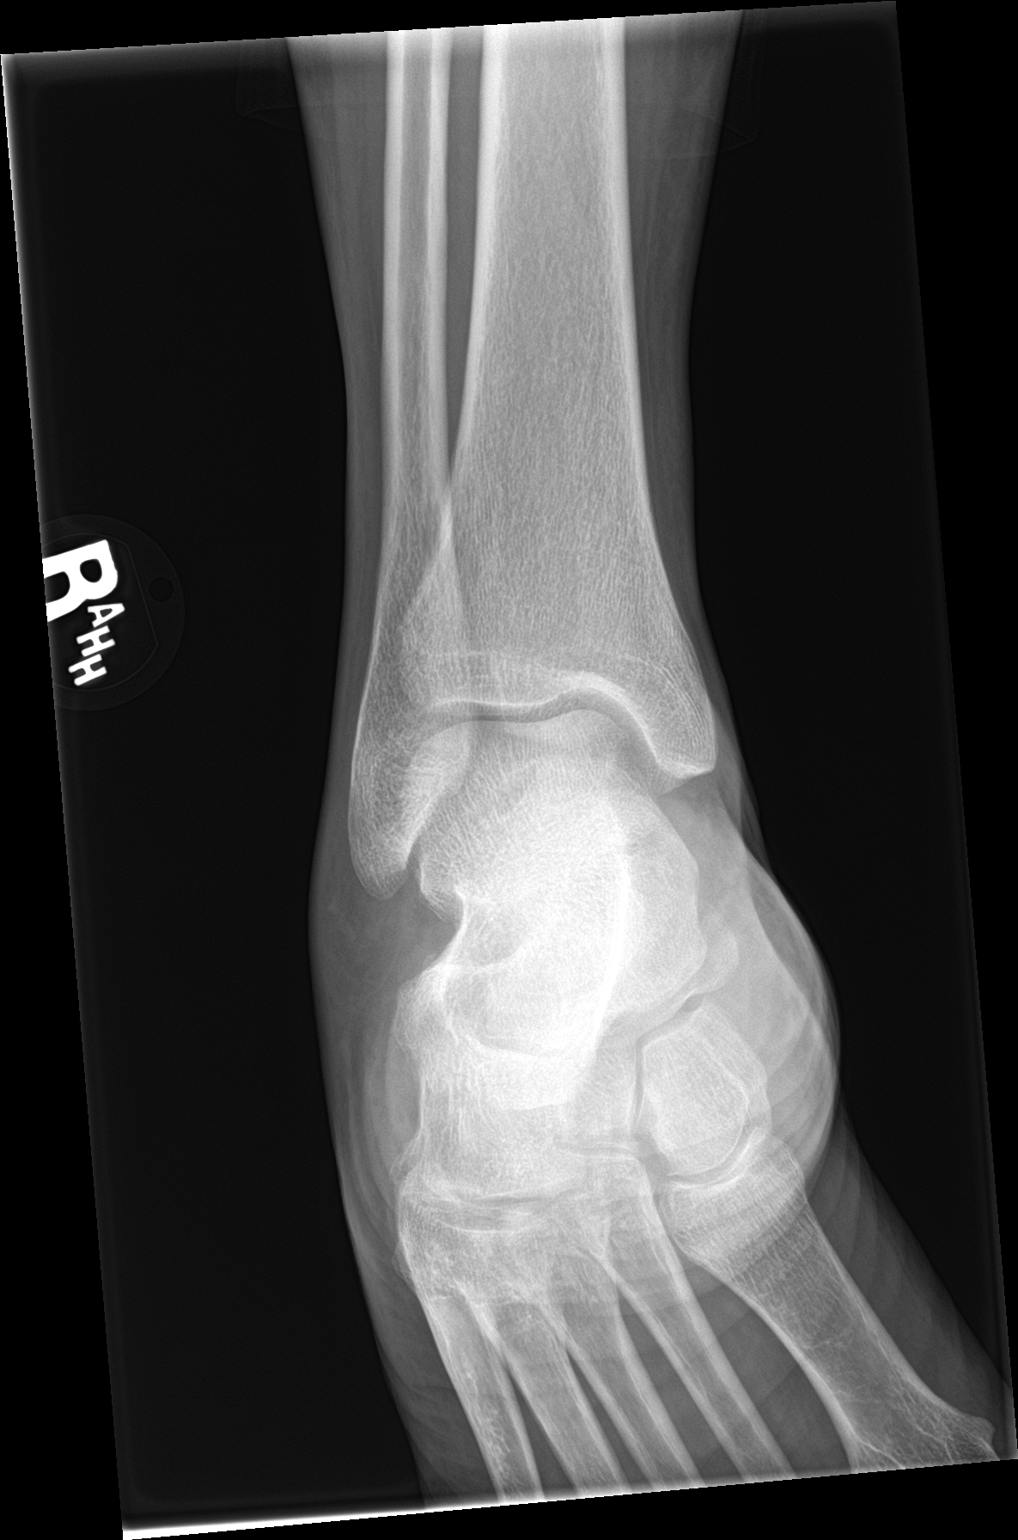

[ankle obl]
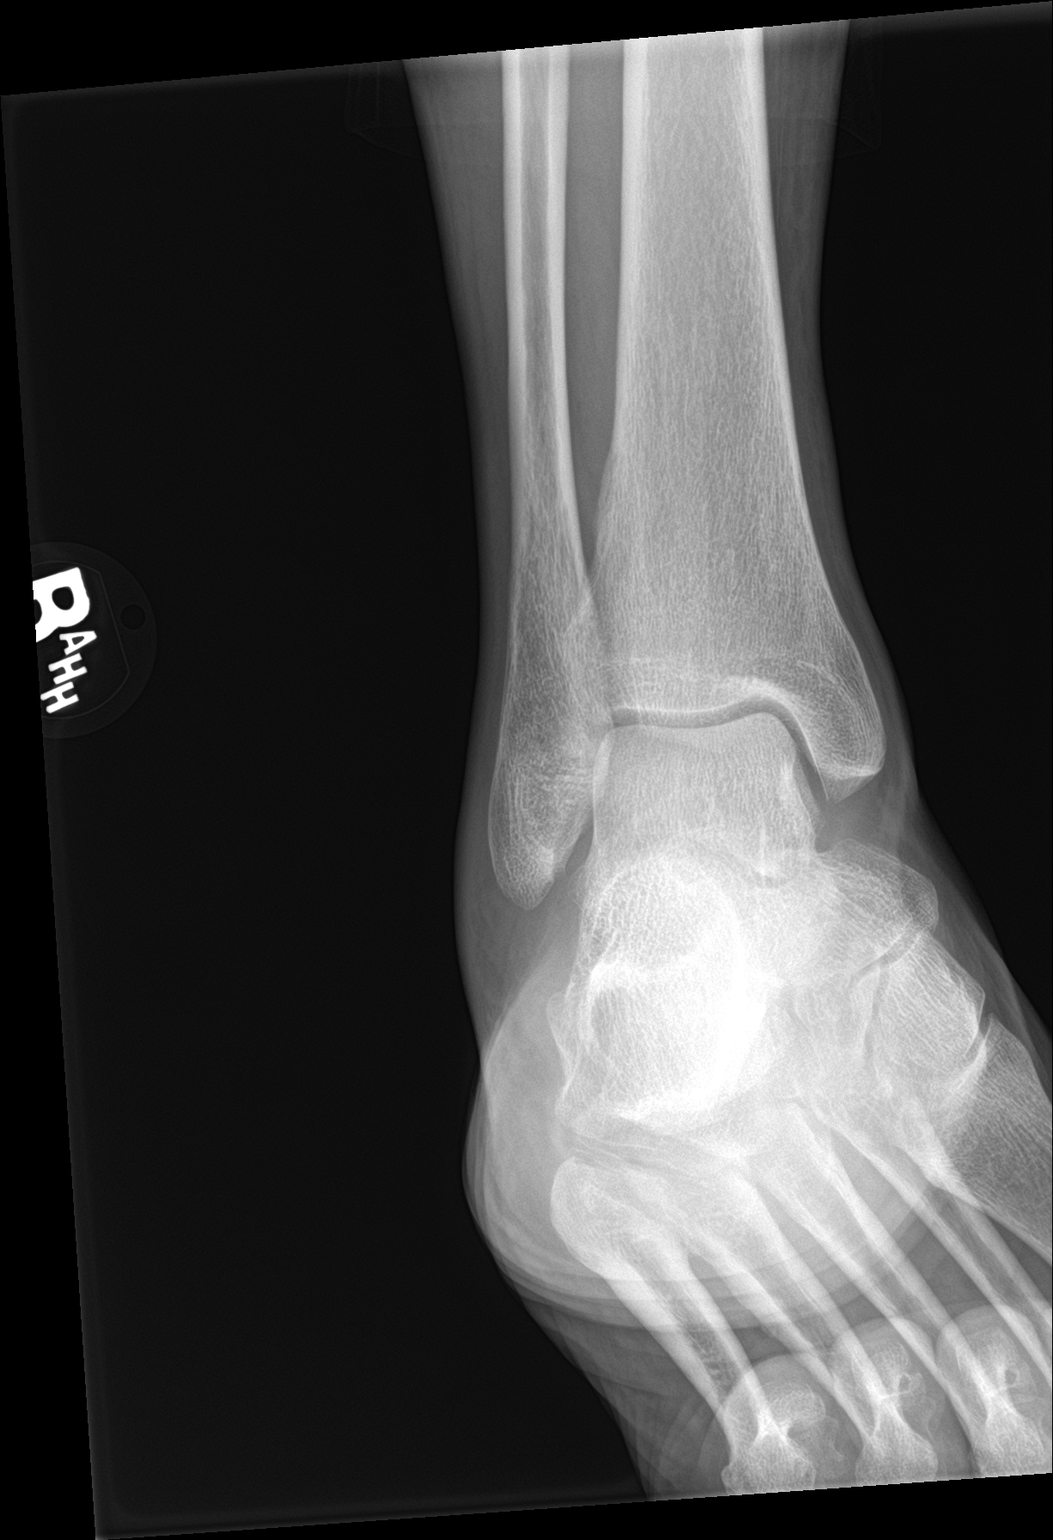

[ankle lat]
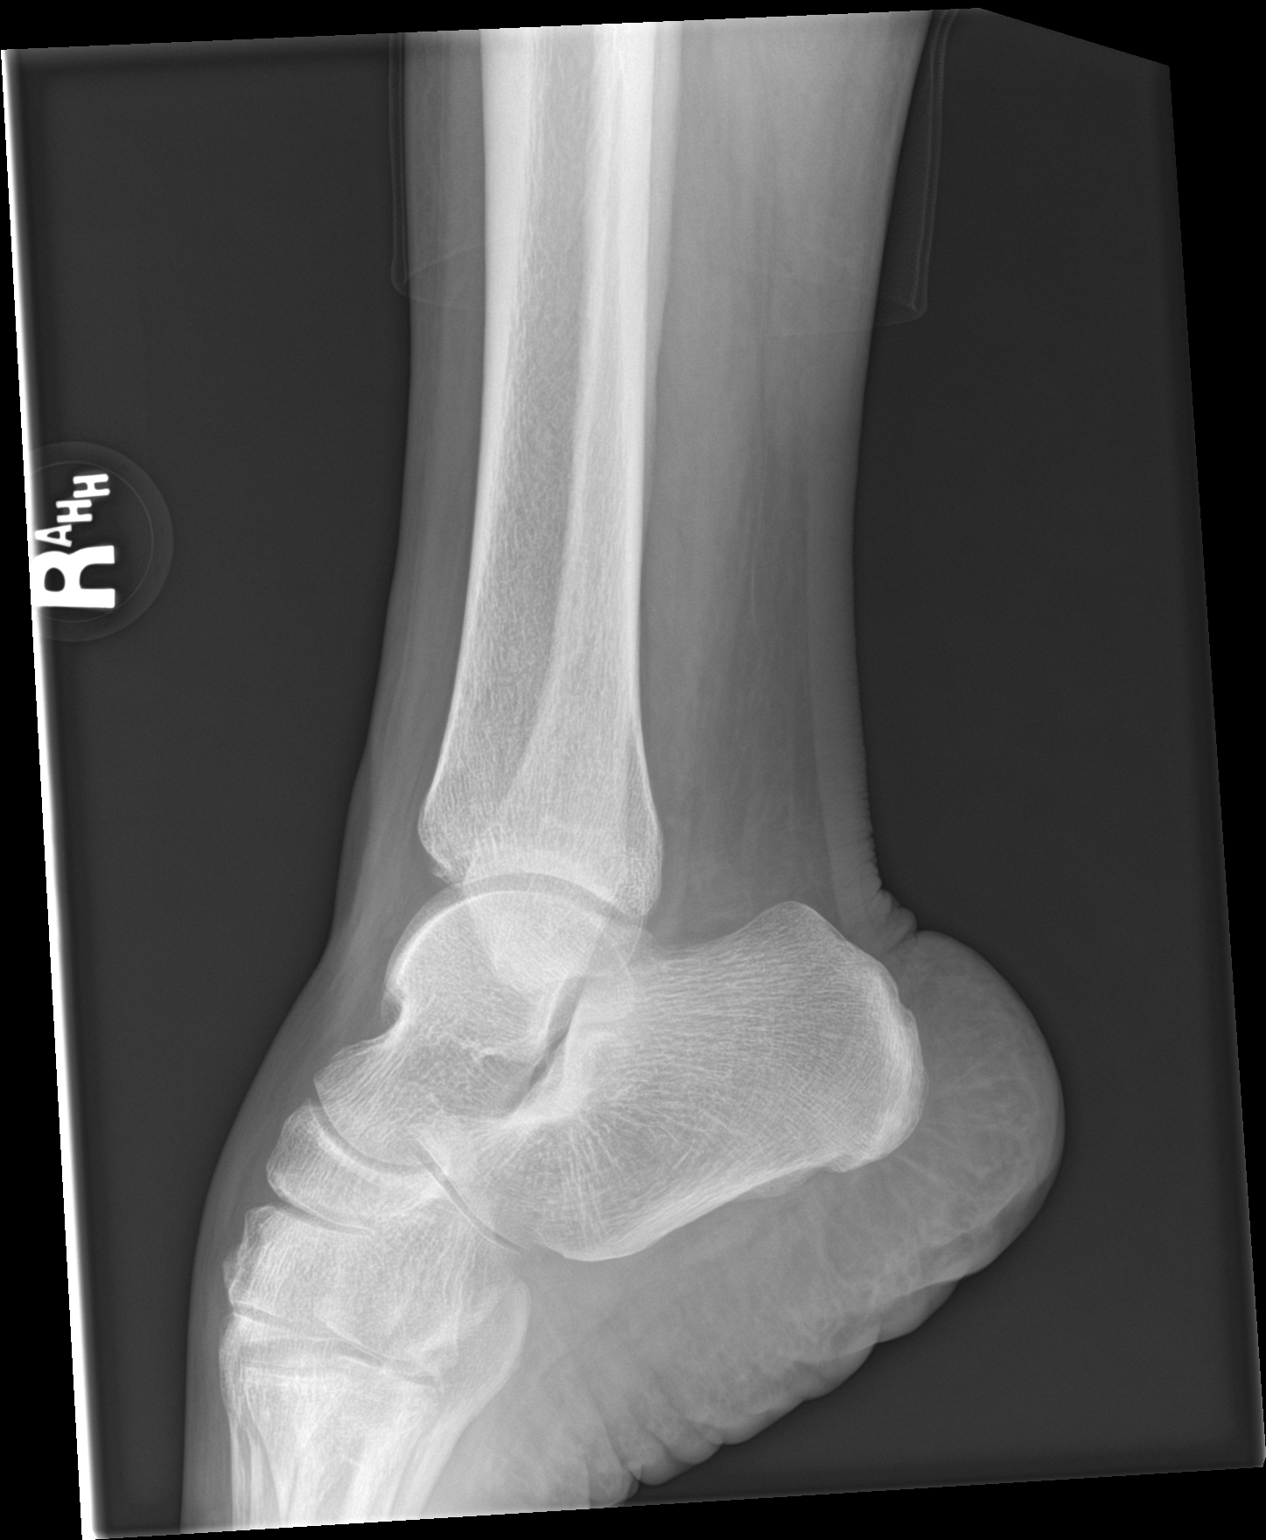

[3 of 3 positions shown; findings below may reference images not displayed]

FINDINGS: There is no evidence of fracture, dislocation, or joint effusion.
There is no evidence of arthropathy or other focal bone abnormality.
Soft tissues are unremarkable.
IMPRESSION: Negative.

## 2021-08-26 LAB — URIC ACID: Uric Acid, Serum: 5.5 mg/dL (ref 2.4–7.0)

## 2021-08-26 NOTE — Addendum Note (Signed)
Addended by: Lamar Blinks C on: 08/26/2021 01:54 PM ? ? Modules accepted: Orders ? ?

## 2021-09-02 ENCOUNTER — Ambulatory Visit (INDEPENDENT_AMBULATORY_CARE_PROVIDER_SITE_OTHER): Payer: Medicare HMO | Admitting: Family Medicine

## 2021-09-02 ENCOUNTER — Ambulatory Visit: Payer: Self-pay

## 2021-09-02 ENCOUNTER — Encounter: Payer: Self-pay | Admitting: Family Medicine

## 2021-09-02 VITALS — BP 128/90 | Ht 63.0 in | Wt 172.0 lb

## 2021-09-02 DIAGNOSIS — Q6671 Congenital pes cavus, right foot: Secondary | ICD-10-CM

## 2021-09-02 DIAGNOSIS — M84376A Stress fracture, unspecified foot, initial encounter for fracture: Secondary | ICD-10-CM | POA: Insufficient documentation

## 2021-09-02 DIAGNOSIS — M25571 Pain in right ankle and joints of right foot: Secondary | ICD-10-CM

## 2021-09-02 DIAGNOSIS — Q6672 Congenital pes cavus, left foot: Secondary | ICD-10-CM

## 2021-09-02 DIAGNOSIS — M7671 Peroneal tendinitis, right leg: Secondary | ICD-10-CM | POA: Diagnosis not present

## 2021-09-02 NOTE — Progress Notes (Signed)
?  Caroline Simmons - 66 y.o. female MRN [id number]  Date of birth: 10/21/1955 ? ?SUBJECTIVE:  Including CC & ROS.  ?No chief complaint on file. ? ? ?Caroline Simmons is a 66 y.o. female that is presenting with acute on chronic right lateral ankle pain.  The pain occurs after she walks.  She has significant swelling.  She has a history of diabetes with diabetic neuropathy.  She has taken medications for this. ? ?Review of the note on 3/6 shows an x-ray uric acid level was obtained. ?Independent review of the right ankle x-ray from 3/6 shows no acute changes. ?Review of the uric acid from 3/6 shows a normal value. ? ?Review of Systems ?See HPI  ? ?HISTORY: Past Medical, Surgical, Social, and Family History Reviewed & Updated per EMR.   ?Pertinent Historical Findings include: ? ?Past Medical History:  ?Diagnosis Date  ? Alopecia   ? Anxiety   ? on meds  ? Arthritis   ? back  ? Cataract 2011  ? bilateral removal  ? Chronic kidney disease (CKD), stage III (moderate) (HCC)   ? Depression   ? on meds  ? Essential hypertension, benign   ? on meds  ? GERD (gastroesophageal reflux disease)   ? on meds  ? Hyperlipidemia   ? on meds  ? Hypothyroid   ? on meds  ? Insomnia, unspecified   ? Menopause   ? lmp 06/2007  ? Neuropathy   ? per pt  ? Type II or unspecified type diabetes mellitus without mention of complication, not stated as uncontrolled   ? on meds  ? Vitamin D deficiency   ? ? ?Past Surgical History:  ?Procedure Laterality Date  ? CATARACT EXTRACTION, BILATERAL    ? CESAREAN SECTION    ? 1982/1991  ? COLONOSCOPY  2012  ? Patterson-F/V-movi(exc)-HPP  ? POLYPECTOMY  2012  ? HPP  ? RETINAL DETACHMENT REPAIR W/ SCLERAL BUCKLE LE  11/12/2011  ? WISDOM TOOTH EXTRACTION    ? ? ? ?PHYSICAL EXAM:  ?VS: BP 128/90 (BP Location: Left Arm, Patient Position: Sitting)   Ht '5\' 3"'$  (1.6 m)   Wt 172 lb (78 kg)   BMI 30.47 kg/m?  ?Physical Exam ?Gen: NAD, alert, cooperative with exam, well-appearing ?MSK:  ?Neurovascularly intact    ? ?Limited ultrasound: Right ankle and foot: ? ?Effusion within the peroneal tendon sheath. ?Hyperemia appreciated within the peroneal tendon sheath. ?Normal insertional peroneal brevis into the base of the fifth metatarsal. ?No ankle effusion. ? ?Summary: Peroneal tendinitis ? ?Ultrasound and interpretation by Clearance Coots, MD ? ? ? ?ASSESSMENT & PLAN:  ? ?Peroneal tendinitis, right ?Acute on chronic in nature.  Having instability that causes a recurrent pain in the lateral component. ?-Counseled on home exercise therapy and supportive care. ?-Counseled on compression. ?-Counseled on Voltaren. ?-Could consider injection or physical therapy. ? ?Pes cavus of both feet ?Has a fairly high longitudinal arch with some loss of the transverse arch.  Has neuropathy that is likely related to her ongoing symptoms. ?-Green sport insoles with lateral plane posting on the right side.  Could consider metatarsal pads ? ? ? ? ?

## 2021-09-02 NOTE — Patient Instructions (Signed)
Nice to meet you  ?Please use ice as needed  ?Please use the insoles  ?Please consider compression  ?Please try the exercises  ?Please try voltaren  ?Please send me a message in MyChart with any questions or updates.  ?Please see me back in 3-4 weeks.  ? ?--Dr. Raeford Razor ? ?

## 2021-09-02 NOTE — Assessment & Plan Note (Signed)
Acute on chronic in nature.  Having instability that causes a recurrent pain in the lateral component. ?-Counseled on home exercise therapy and supportive care. ?-Counseled on compression. ?-Counseled on Voltaren. ?-Could consider injection or physical therapy. ?

## 2021-09-02 NOTE — Assessment & Plan Note (Signed)
Has a fairly high longitudinal arch with some loss of the transverse arch.  Has neuropathy that is likely related to her ongoing symptoms. ?-Green sport insoles with lateral plane posting on the right side.  Could consider metatarsal pads ?

## 2021-09-26 ENCOUNTER — Other Ambulatory Visit: Payer: Self-pay | Admitting: Neurology

## 2021-10-06 ENCOUNTER — Telehealth: Payer: Medicare HMO | Admitting: Family Medicine

## 2021-10-06 ENCOUNTER — Telehealth: Payer: Self-pay | Admitting: Family Medicine

## 2021-10-06 NOTE — Telephone Encounter (Signed)
Per pt she is in [place] w/sick relative & her feet as very painful she can barely walk. ? Patient has used all Volteran rub, it did help a little & Pt now request Rx for foot pain . ? ? If any questions call her at [phone]. ? ?--Patient ask for Rx to be sent to :  [facility] [address] ?Store [phone] ?[facility] -Call [phone] ? ?--Forwarding request to  med asst for review with provider. ? ?glh ? ?

## 2021-10-07 MED ORDER — PREDNISONE 5 MG PO TABS: ORAL_TABLET | ORAL | 0 refills | Status: DC

## 2021-10-20 ENCOUNTER — Other Ambulatory Visit: Payer: Self-pay | Admitting: Family Medicine

## 2021-10-20 DIAGNOSIS — E785 Hyperlipidemia, unspecified: Secondary | ICD-10-CM

## 2021-10-21 DIAGNOSIS — N1832 Chronic kidney disease, stage 3b: Secondary | ICD-10-CM | POA: Diagnosis not present

## 2021-10-27 ENCOUNTER — Ambulatory Visit (INDEPENDENT_AMBULATORY_CARE_PROVIDER_SITE_OTHER): Payer: Medicare HMO | Admitting: Family Medicine

## 2021-10-27 ENCOUNTER — Other Ambulatory Visit (HOSPITAL_BASED_OUTPATIENT_CLINIC_OR_DEPARTMENT_OTHER): Payer: Self-pay

## 2021-10-27 ENCOUNTER — Ambulatory Visit (HOSPITAL_BASED_OUTPATIENT_CLINIC_OR_DEPARTMENT_OTHER)
Admission: RE | Admit: 2021-10-27 | Discharge: 2021-10-27 | Disposition: A | Discharge status: Home / Self Care | Payer: Medicare HMO | Source: Ambulatory Visit | Attending: Family Medicine | Admitting: Family Medicine

## 2021-10-27 ENCOUNTER — Encounter: Payer: Self-pay | Admitting: Family Medicine

## 2021-10-27 VITALS — BP 138/82 | Ht 63.0 in | Wt 172.0 lb

## 2021-10-27 DIAGNOSIS — M25475 Effusion, left foot: Secondary | ICD-10-CM | POA: Insufficient documentation

## 2021-10-27 DIAGNOSIS — M25572 Pain in left ankle and joints of left foot: Secondary | ICD-10-CM | POA: Diagnosis not present

## 2021-10-27 DIAGNOSIS — M7989 Other specified soft tissue disorders: Secondary | ICD-10-CM | POA: Diagnosis not present

## 2021-10-27 DIAGNOSIS — M7671 Peroneal tendinitis, right leg: Secondary | ICD-10-CM | POA: Diagnosis not present

## 2021-10-27 DIAGNOSIS — M19072 Primary osteoarthritis, left ankle and foot: Secondary | ICD-10-CM | POA: Insufficient documentation

## 2021-10-27 DIAGNOSIS — M79672 Pain in left foot: Secondary | ICD-10-CM | POA: Diagnosis not present

## 2021-10-27 DIAGNOSIS — M79671 Pain in right foot: Secondary | ICD-10-CM | POA: Diagnosis not present

## 2021-10-27 IMAGING — DX DG FOOT COMPLETE 3+V*R*
3 series · 3 of 3 positions shown · non-contrast
Comparison: X-ray ankle [DATE].

CLINICAL DATA: Peroneal tendonitis.  Right foot pain.

EXAM:
RIGHT FOOT COMPLETE - 3+ VIEW

[foot ap]
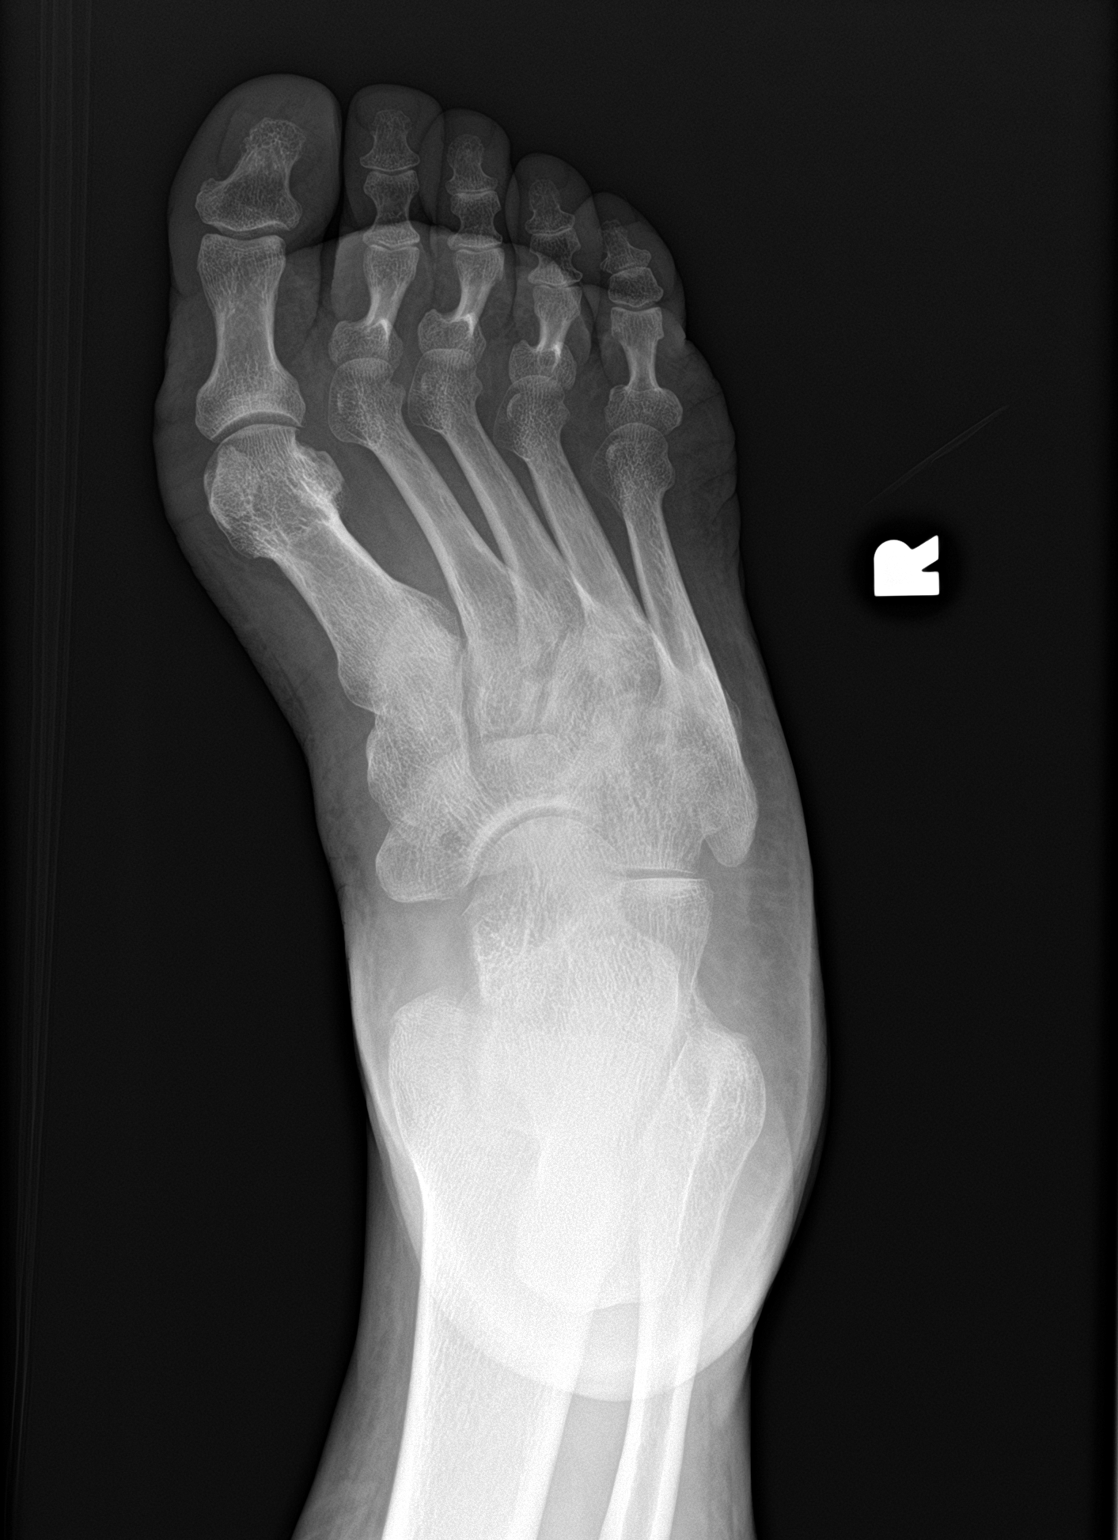

[foot obl]
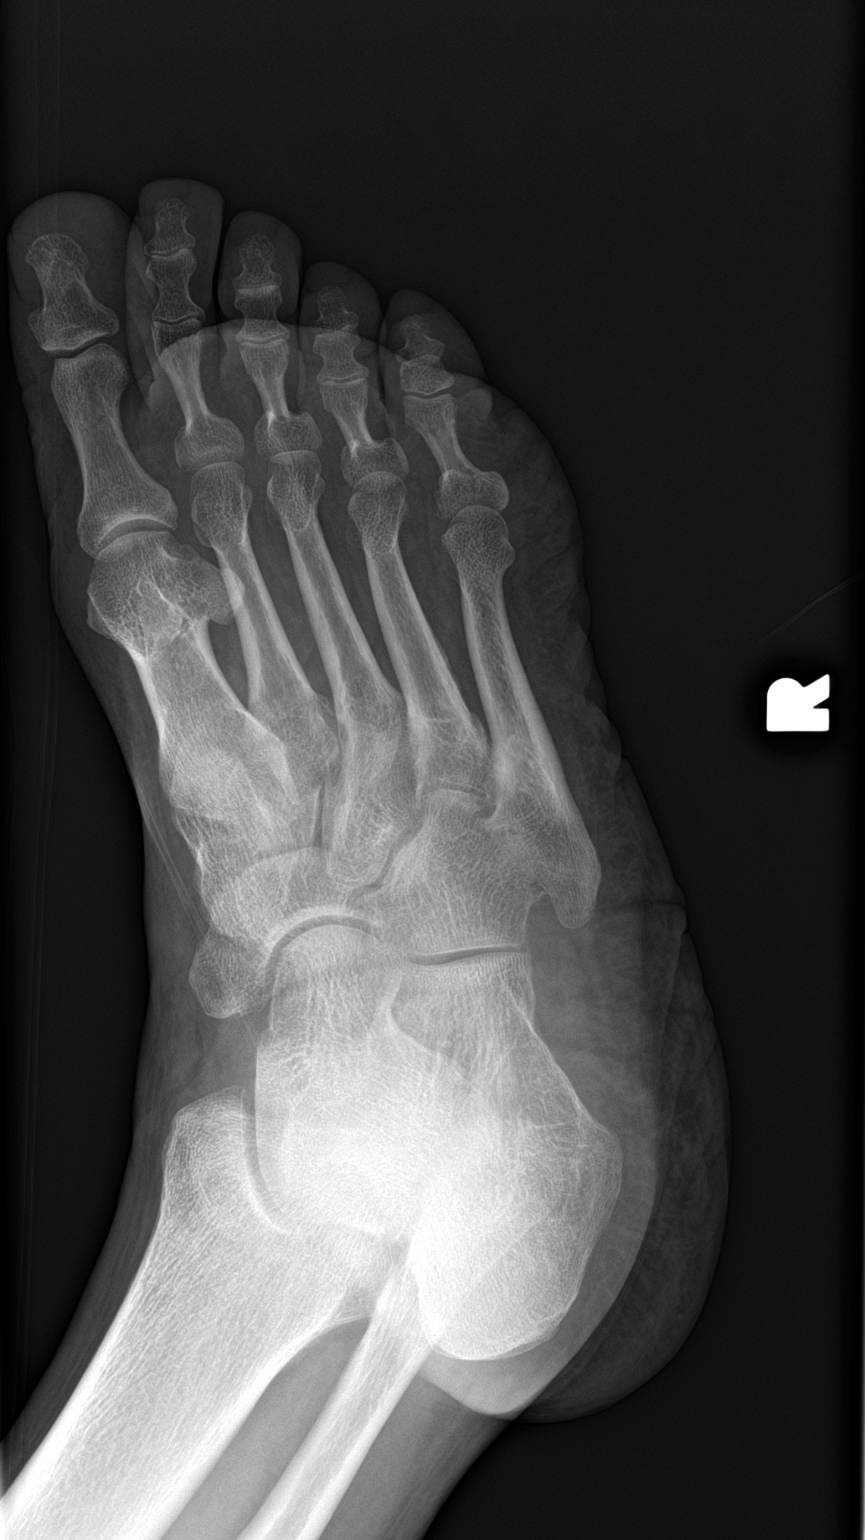

[foot lat]
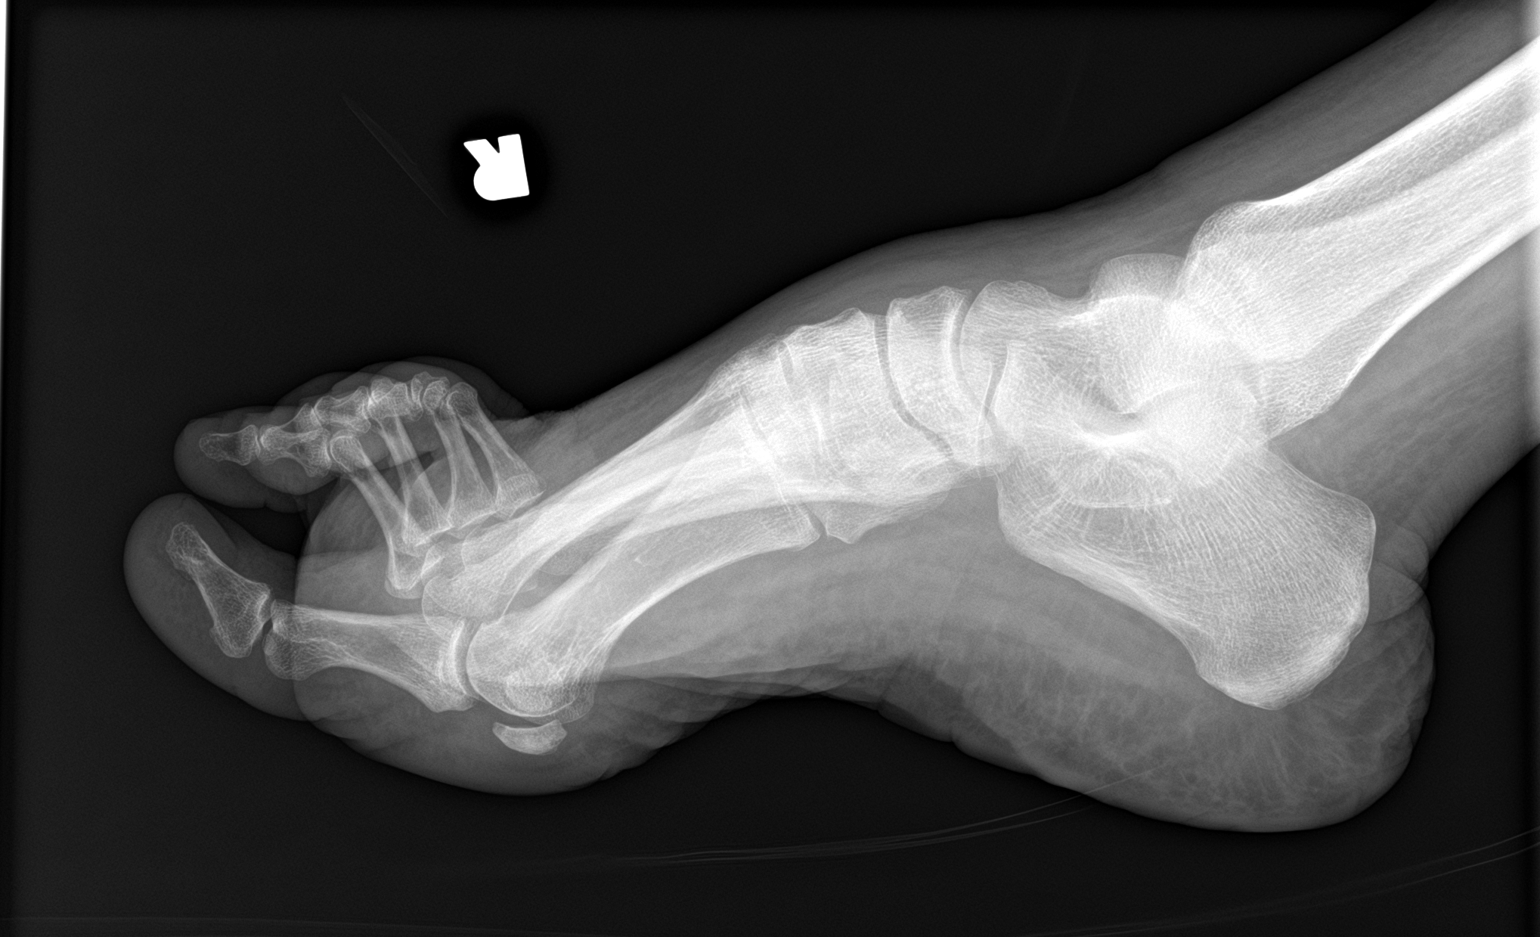

[3 of 3 positions shown; findings below may reference images not displayed]

FINDINGS: No fracture or malalignment. Mild degenerative changes at the first
MTP joint with slight hallux valgus deformity.
IMPRESSION: Mild degenerative changes at the first MTP joint

## 2021-10-27 IMAGING — DX DG FOOT COMPLETE 3+V*L*
3 series · 3 of 3 positions shown · non-contrast
Comparison: Left ankle radiograph dated [DATE].

CLINICAL DATA: Left foot pain.

EXAM:
LEFT FOOT - COMPLETE 3+ VIEW

[foot ap]
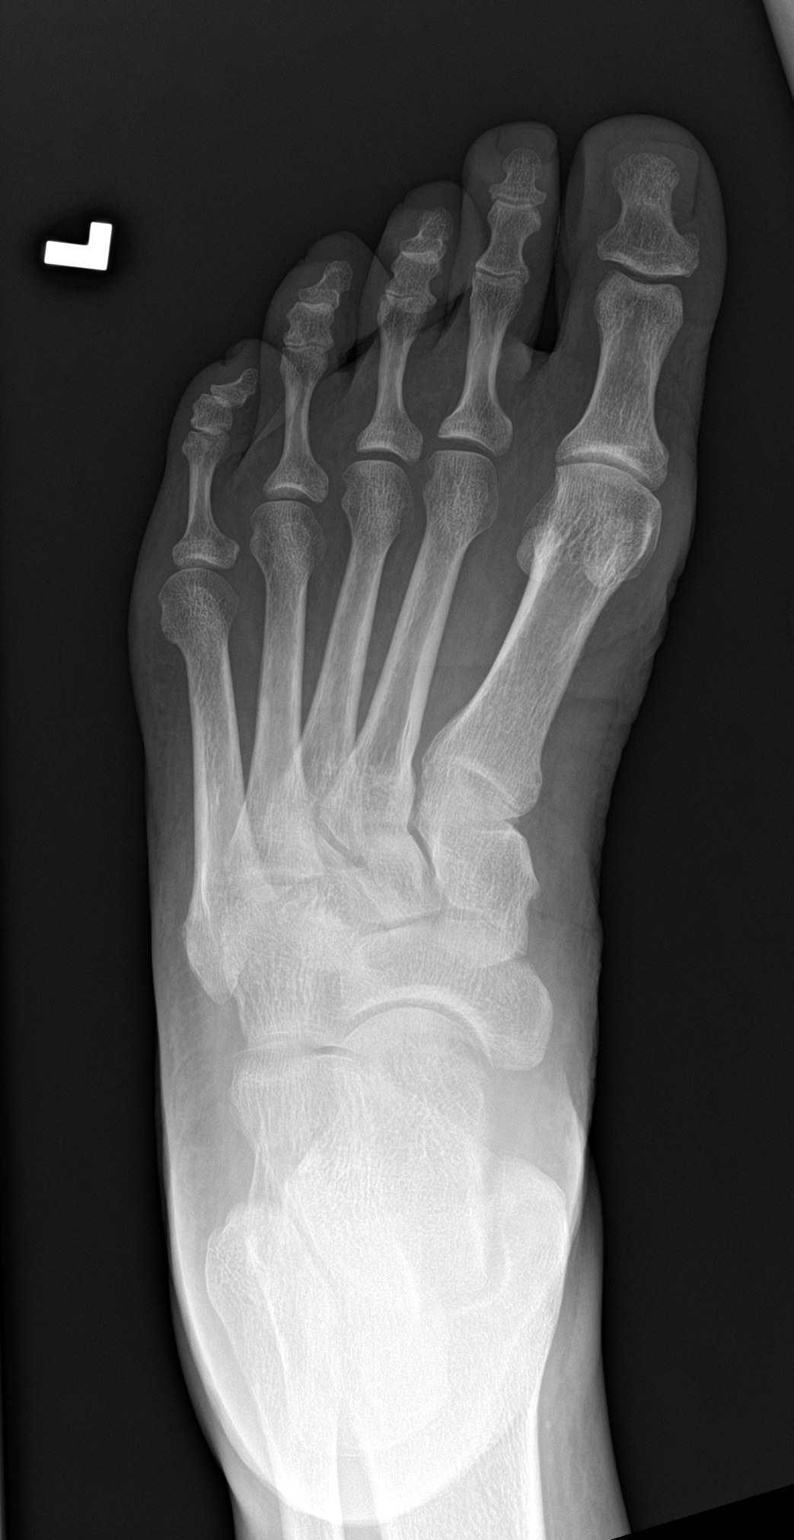

[foot obl]
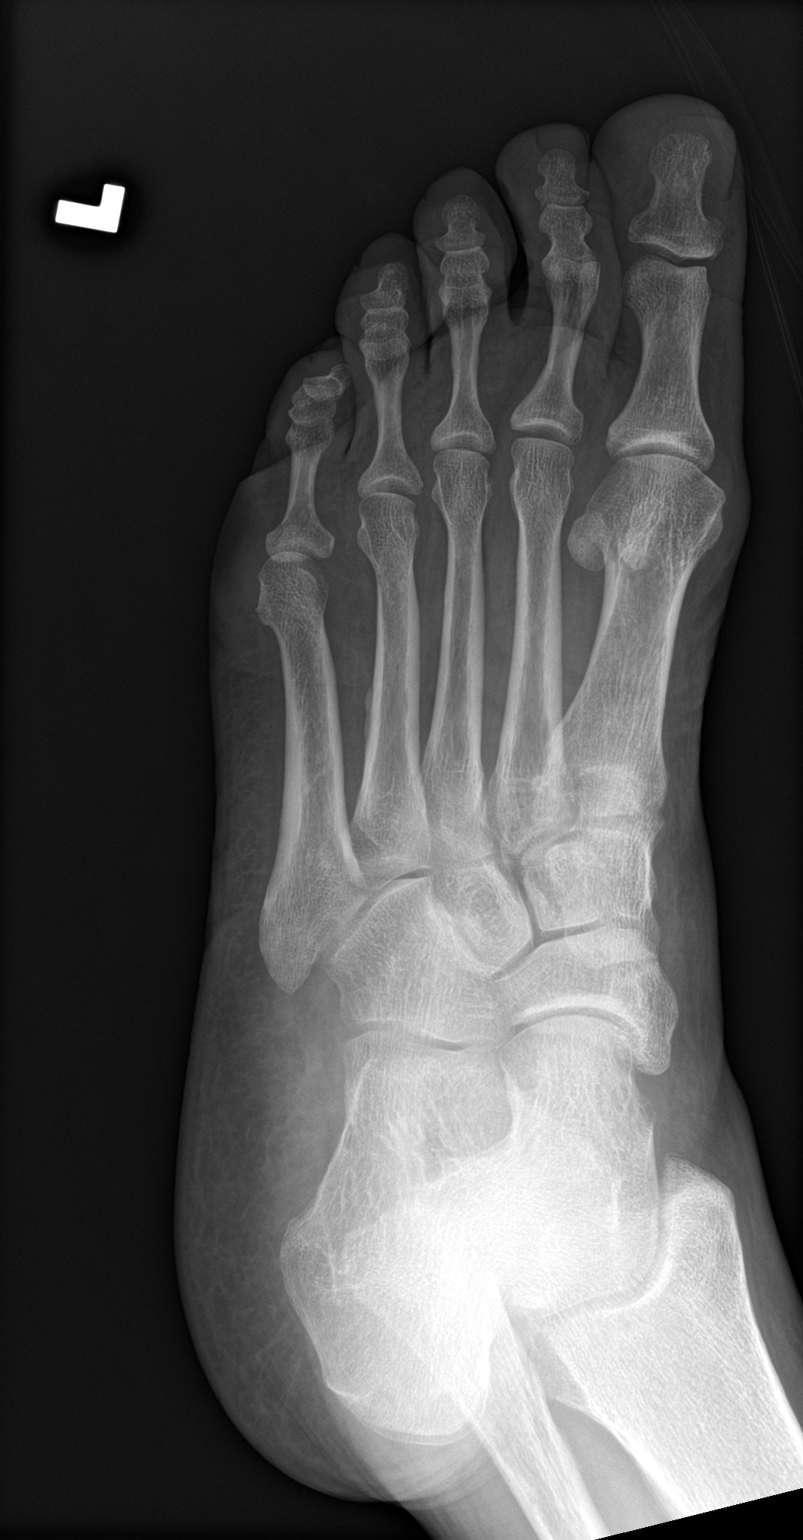

[foot lat]
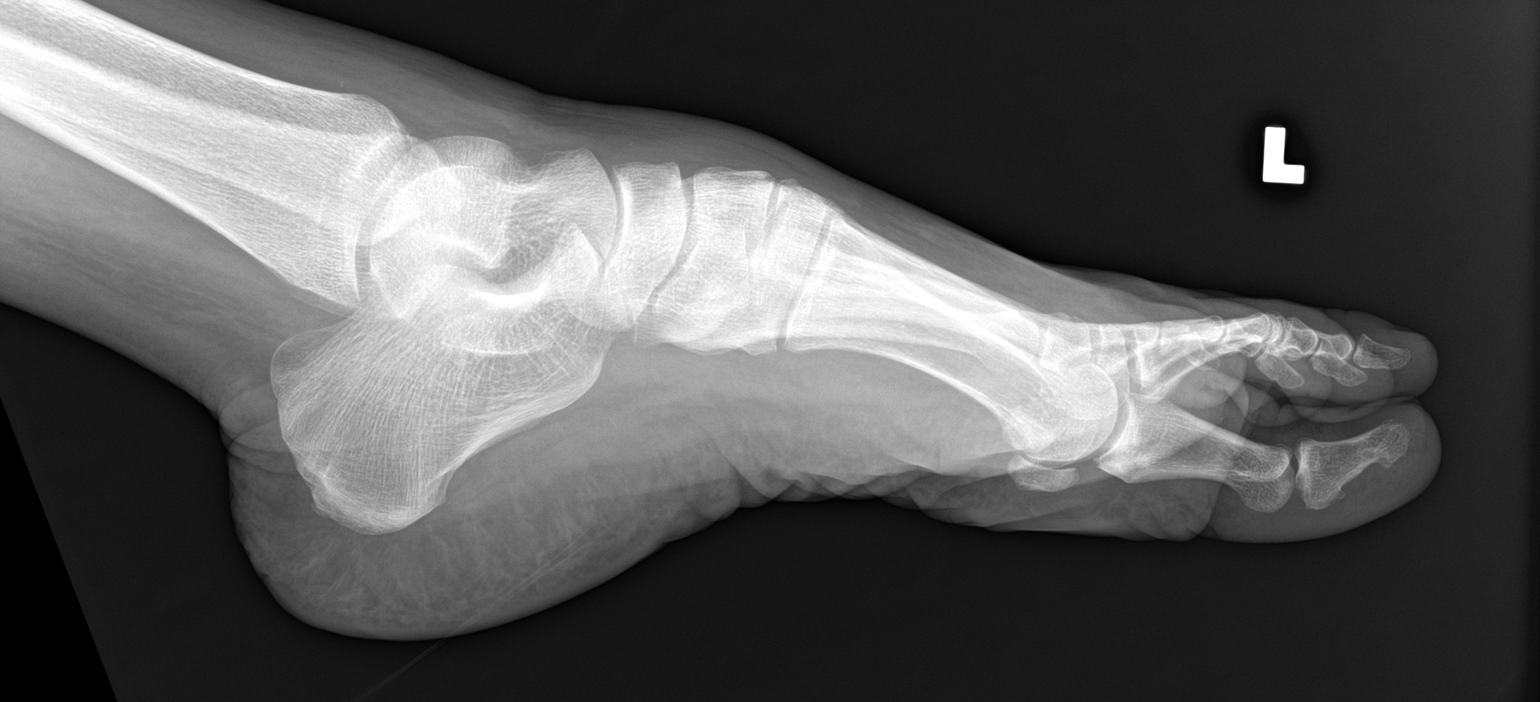

[3 of 3 positions shown; findings below may reference images not displayed]

FINDINGS: There is no evidence of fracture or dislocation. There is no
evidence of arthropathy or other focal bone abnormality. Soft
tissues are unremarkable.
IMPRESSION: Negative.

## 2021-10-27 IMAGING — DX DG ANKLE COMPLETE 3+V*L*
4 series · 4 of 4 positions shown · non-contrast
Comparison: None Available.

CLINICAL DATA: Left ankle pain radiates to foot with swelling.

EXAM:
LEFT ANKLE COMPLETE - 3+ VIEW

[ankle ap]
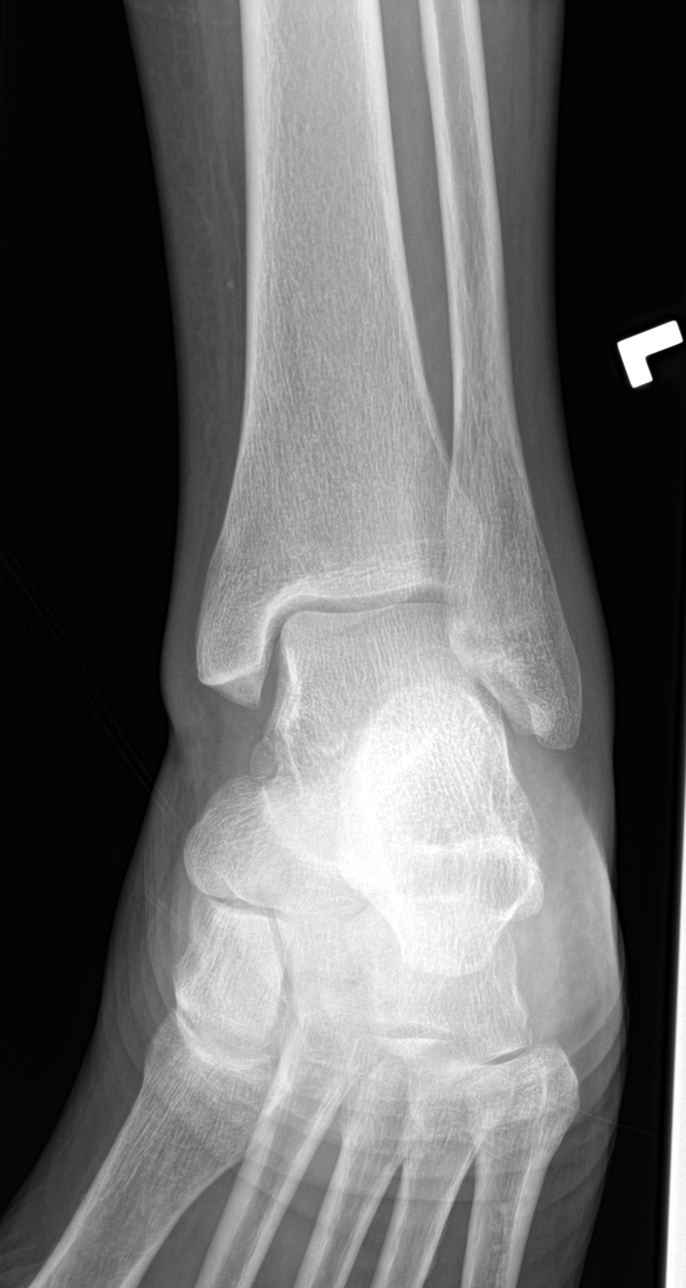

[ankle obl (1 of 2)]
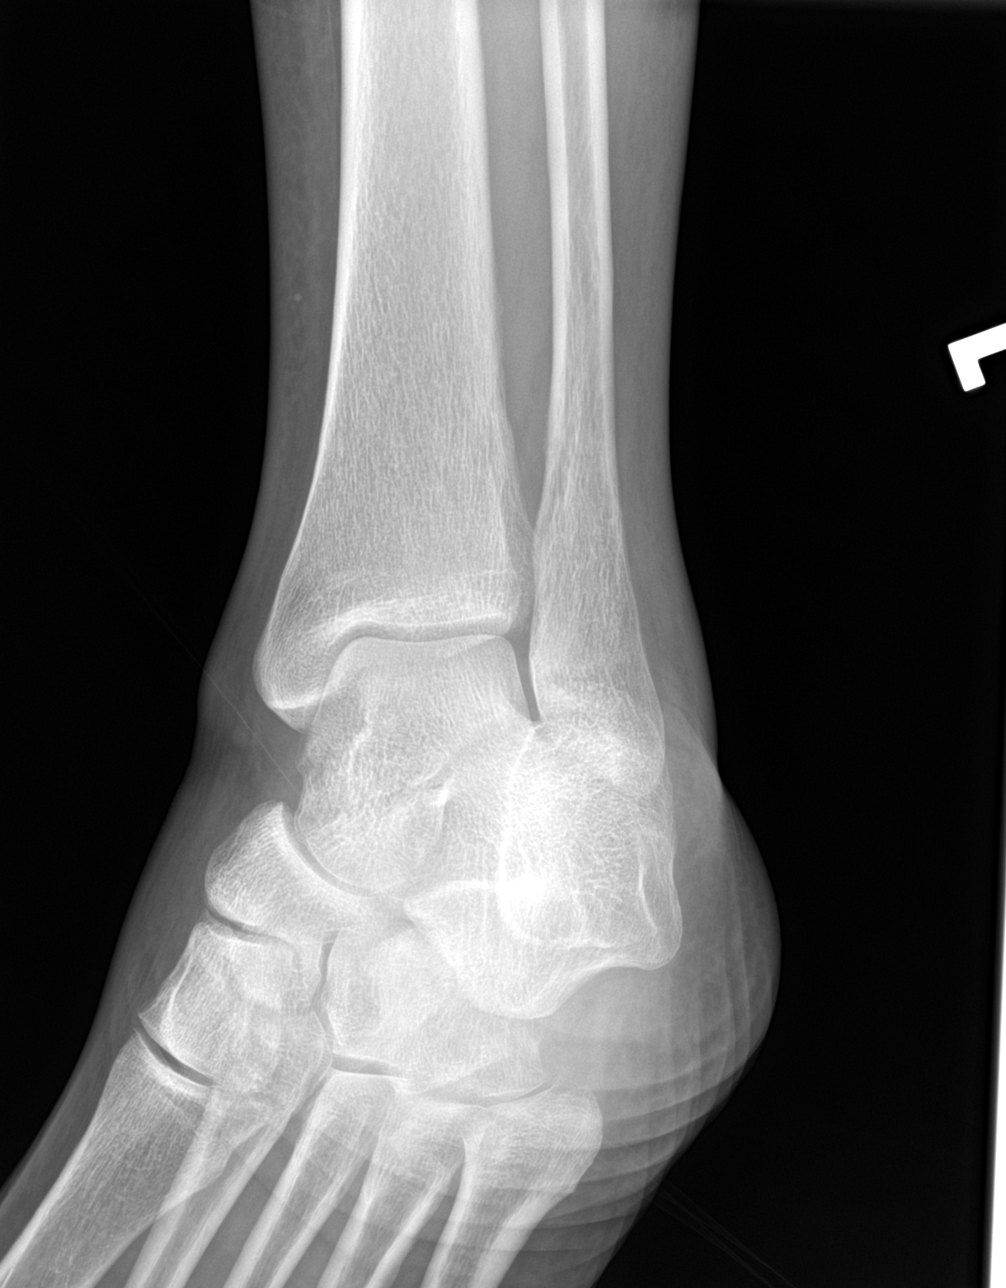

[ankle lat]
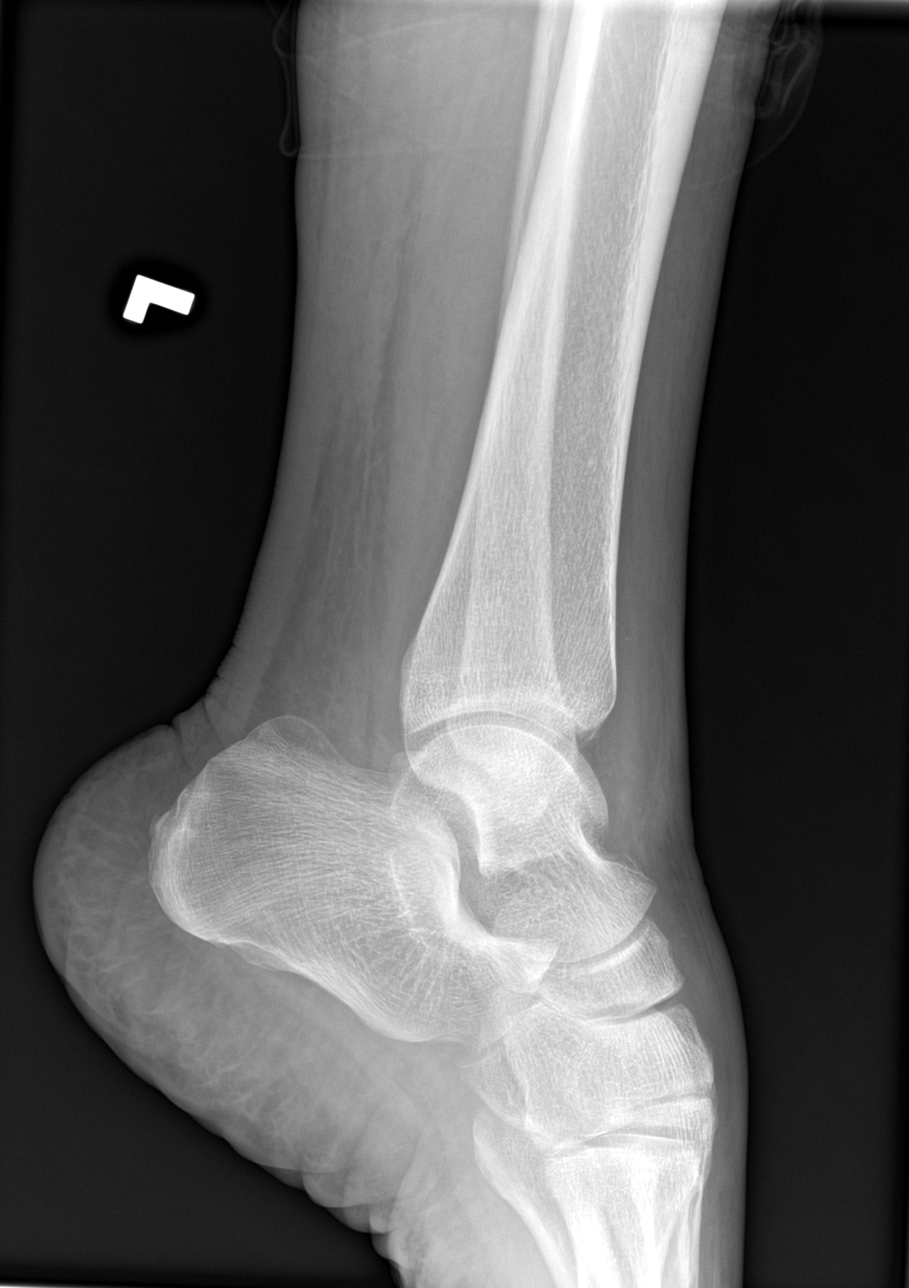

[ankle obl (2 of 2)]
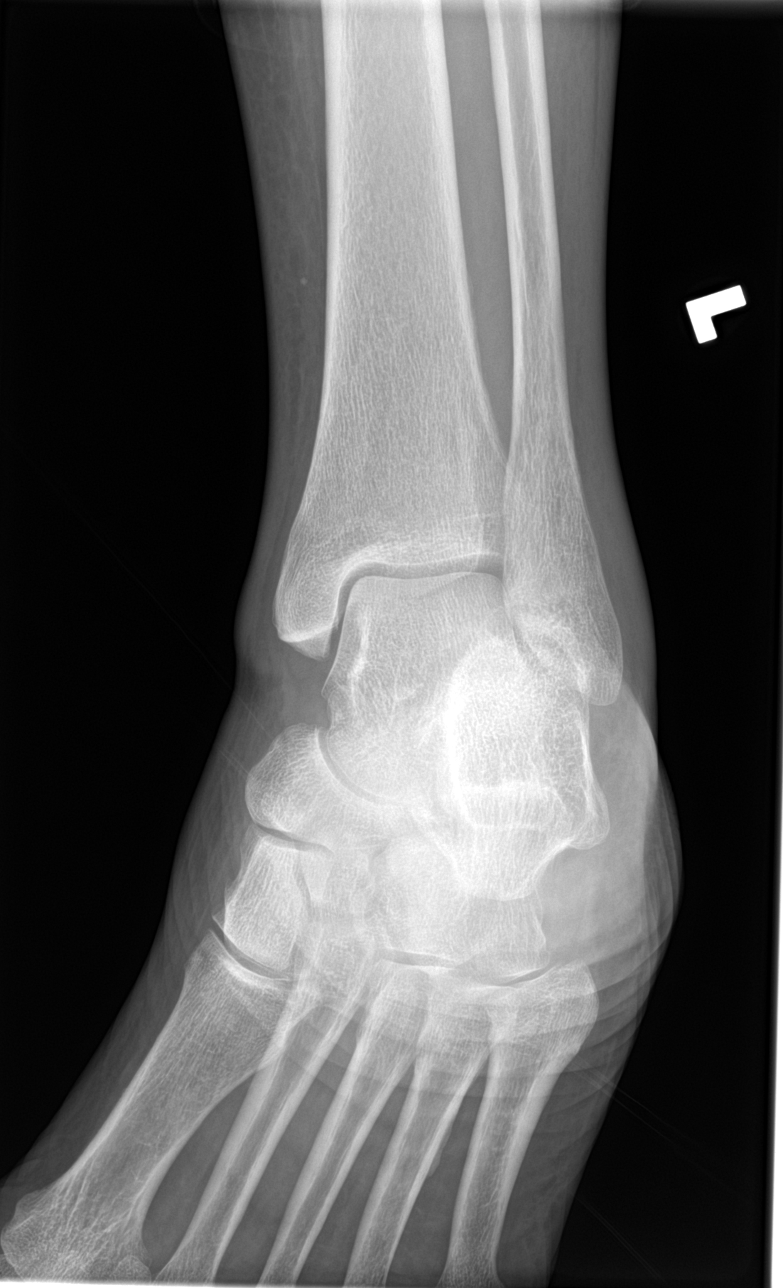

[4 of 4 positions shown; findings below may reference images not displayed]

FINDINGS: There is no evidence of fracture, dislocation, or joint effusion.
GAOREWE alignment. There is no evidence of arthropathy or other
focal bone abnormality. Soft tissues are unremarkable.
IMPRESSION: 1. No acute fracture or dislocation of the left ankle.
2. GAOREWE alignment.

## 2021-10-27 MED ORDER — HYDROCODONE-ACETAMINOPHEN 5-325 MG PO TABS
1.0000 | ORAL_TABLET | Freq: Three times a day (TID) | ORAL | 0 refills | Status: DC | PRN
  Filled 2021-10-27: qty 8, 3d supply, fill #0

## 2021-10-27 NOTE — Progress Notes (Signed)
?  Akyia Borelli - 66 y.o. female MRN [id number]  Date of birth: 04-10-1956 ? ?SUBJECTIVE:  Including CC & ROS.  ?No chief complaint on file. ? ? ?Kaylia Winborne is a 66 y.o. female that is presenting with acute worsening of her right and left foot pain.  She is having worsening swelling over the lateral aspect of each foot.  The right is worse than the left.  A course of prednisone was sent in while she was away in [place] and that offered no improvement of her symptoms.  She does have history of protein urea and chronic kidney disease. ? ? ?Review of Systems ?See HPI  ? ?HISTORY: Past Medical, Surgical, Social, and Family History Reviewed & Updated per EMR.   ?Pertinent Historical Findings include: ? ?Past Medical History:  ?Diagnosis Date  ? Alopecia   ? Anxiety   ? on meds  ? Arthritis   ? back  ? Cataract 2011  ? bilateral removal  ? Chronic kidney disease (CKD), stage III (moderate) (HCC)   ? Depression   ? on meds  ? Essential hypertension, benign   ? on meds  ? GERD (gastroesophageal reflux disease)   ? on meds  ? Hyperlipidemia   ? on meds  ? Hypothyroid   ? on meds  ? Insomnia, unspecified   ? Menopause   ? lmp 06/2007  ? Neuropathy   ? per pt  ? Type II or unspecified type diabetes mellitus without mention of complication, not stated as uncontrolled   ? on meds  ? Vitamin D deficiency   ? ? ?Past Surgical History:  ?Procedure Laterality Date  ? CATARACT EXTRACTION, BILATERAL    ? CESAREAN SECTION    ? 1982/1991  ? COLONOSCOPY  2012  ? Patterson-F/V-movi(exc)-HPP  ? POLYPECTOMY  2012  ? HPP  ? RETINAL DETACHMENT REPAIR W/ SCLERAL BUCKLE LE  11/12/2011  ? WISDOM TOOTH EXTRACTION    ? ? ? ?PHYSICAL EXAM:  ?VS: BP 138/82 (BP Location: Left Arm, Patient Position: Sitting)   Ht '5\' 3"'$  (1.6 m)   Wt 172 lb (78 kg)   BMI 30.47 kg/m?  ?Physical Exam ?Gen: NAD, alert, cooperative with exam, well-appearing ?MSK:  ?Neurovascularly intact   ? ? ? ? ?ASSESSMENT & PLAN:  ? ?Swelling of foot joint, left ?Acutely  occurring.  No improvement with recent course of prednisone.  Possible that her worsening swelling is related to her kidney disease as opposed to a musculoskeletal origin. ?-Counseled on home exercise therapy and supportive care. ?-X-ray of the foot and ankle. ?-Could consider further imaging. ?-She does have follow-up with her nephrologist in a couple days. ? ?Peroneal tendinitis, right ?Acute worsening of her foot and ankle swelling.  Having severe pain.  Has a history of previous injury to the right foot from years ago.  Possible that her swelling is more related to her kidney disease as opposed to musculoskeletal.  She had no improvement with recent course of prednisone. ?-Counseled on home exercise therapy and supportive care. ?-Cam walker today. ?-Foot x-ray. ?- norco ?-May need to consider lab work or further imaging. ? ? ? ? ?

## 2021-10-27 NOTE — Assessment & Plan Note (Signed)
Acutely occurring.  No improvement with recent course of prednisone.  Possible that her worsening swelling is related to her kidney disease as opposed to a musculoskeletal origin. ?-Counseled on home exercise therapy and supportive care. ?-X-ray of the foot and ankle. ?-Could consider further imaging. ?-She does have follow-up with her nephrologist in a couple days. ?

## 2021-10-27 NOTE — Assessment & Plan Note (Addendum)
Acute worsening of her foot and ankle swelling.  Having severe pain.  Has a history of previous injury to the right foot from years ago.  Possible that her swelling is more related to her kidney disease as opposed to musculoskeletal.  She had no improvement with recent course of prednisone. ?-Counseled on home exercise therapy and supportive care. ?-Cam walker today. ?-Foot x-ray. ?- norco ?-May need to consider lab work or further imaging. ?

## 2021-10-27 NOTE — Patient Instructions (Signed)
Good to see you ?Please continue to elevate your feet. ?Please use the boot on the right foot. ?I will call you with results from today.   ?We may lead to pursue further imaging ?Please send me a message in MyChart with any questions or updates.  ?We'll have follow up based on what the results show.  ? ?--Dr. Raeford Razor ? ?

## 2021-10-29 DIAGNOSIS — Z87891 Personal history of nicotine dependence: Secondary | ICD-10-CM | POA: Diagnosis not present

## 2021-10-29 DIAGNOSIS — E872 Acidosis, unspecified: Secondary | ICD-10-CM | POA: Diagnosis not present

## 2021-10-29 DIAGNOSIS — Z794 Long term (current) use of insulin: Secondary | ICD-10-CM | POA: Diagnosis not present

## 2021-10-29 DIAGNOSIS — R600 Localized edema: Secondary | ICD-10-CM | POA: Diagnosis not present

## 2021-10-29 DIAGNOSIS — E1122 Type 2 diabetes mellitus with diabetic chronic kidney disease: Secondary | ICD-10-CM | POA: Diagnosis not present

## 2021-10-29 DIAGNOSIS — E1140 Type 2 diabetes mellitus with diabetic neuropathy, unspecified: Secondary | ICD-10-CM | POA: Diagnosis not present

## 2021-10-29 DIAGNOSIS — I129 Hypertensive chronic kidney disease with stage 1 through stage 4 chronic kidney disease, or unspecified chronic kidney disease: Secondary | ICD-10-CM | POA: Diagnosis not present

## 2021-10-29 DIAGNOSIS — N1832 Chronic kidney disease, stage 3b: Secondary | ICD-10-CM | POA: Diagnosis not present

## 2021-10-30 ENCOUNTER — Telehealth: Payer: Self-pay | Admitting: Family Medicine

## 2021-10-30 DIAGNOSIS — M7671 Peroneal tendinitis, right leg: Secondary | ICD-10-CM

## 2021-10-30 DIAGNOSIS — M25475 Effusion, left foot: Secondary | ICD-10-CM

## 2021-10-30 NOTE — Telephone Encounter (Signed)
Left VM for patient. If she calls back please have her speak with a nurse/CMA and inform that her xrays do not show a reason for her swelling in her feet.  If the swelling continues we may need to consider an MRI of the area ? ?If any questions then please take the best time and phone number to call and I will try to call her back.  ? ?Rosemarie Ax, MD ?[facility] ?10/30/2021, 4:56 PM ? ? ?

## 2021-11-03 NOTE — Telephone Encounter (Signed)
Pt informed of below.  She states her feet are so painful and swollen. She can not walk. Please place MRI order ?

## 2021-11-04 NOTE — Telephone Encounter (Signed)
Patient having severe pain with foot and ankle swelling.  Concern for possible osteochondral defect in the right foot and occult fracture of the left foot.  We will pursue MRI of the right ankle to evaluate for osteochondral defect and presurgical planning.  We will pursue MRI of the left ankle to evaluate for occult fracture and for presurgical planning. ? ?Rosemarie Ax, MD ?[facility] ?11/04/2021, 1:45 PM ? ?

## 2021-11-09 ENCOUNTER — Ambulatory Visit (INDEPENDENT_AMBULATORY_CARE_PROVIDER_SITE_OTHER): Payer: Medicare HMO

## 2021-11-09 DIAGNOSIS — M65871 Other synovitis and tenosynovitis, right ankle and foot: Secondary | ICD-10-CM | POA: Diagnosis not present

## 2021-11-09 DIAGNOSIS — M25572 Pain in left ankle and joints of left foot: Secondary | ICD-10-CM | POA: Diagnosis not present

## 2021-11-09 DIAGNOSIS — M25475 Effusion, left foot: Secondary | ICD-10-CM

## 2021-11-09 DIAGNOSIS — M25571 Pain in right ankle and joints of right foot: Secondary | ICD-10-CM

## 2021-11-09 DIAGNOSIS — M7671 Peroneal tendinitis, right leg: Secondary | ICD-10-CM | POA: Diagnosis not present

## 2021-11-09 DIAGNOSIS — M19072 Primary osteoarthritis, left ankle and foot: Secondary | ICD-10-CM | POA: Diagnosis not present

## 2021-11-09 DIAGNOSIS — R600 Localized edema: Secondary | ICD-10-CM | POA: Diagnosis not present

## 2021-11-09 IMAGING — MR MR ANKLE*R* W/O CM
5 series · 40 of 40 positions shown · non-contrast
Comparison: Right foot radiographs [DATE]; Right ankle
radiographs [DATE]

CLINICAL DATA: Ankle pain.  Osteochondral lesion suspected.

EXAM:
MRI OF THE RIGHT ANKLE WITHOUT CONTRAST
TECHNIQUE: Multiplanar, multisequence MR imaging of the ankle was performed. No
intravenous contrast was administered.

[Series 3: T2 fat-sat · axial · 3.0mm · 0.70mm/px · z∈[-142,-14]mm · 9 of 40 slices shown (1 of 2)]
[im 1/40]
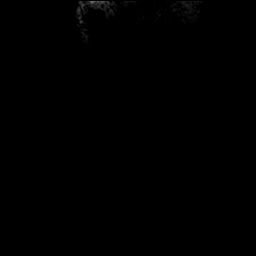
[im 5/40]
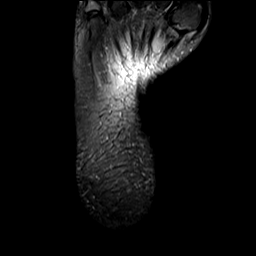
[im 10/40]
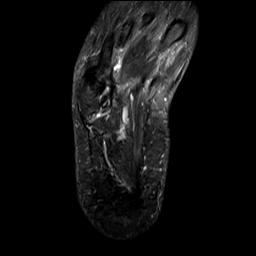
[im 15/40]
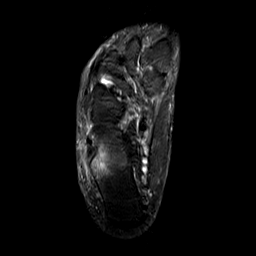
[im 20/40]
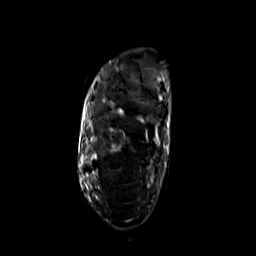
[im 25/40]
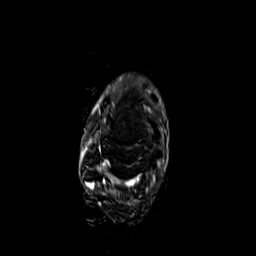
[im 30/40]
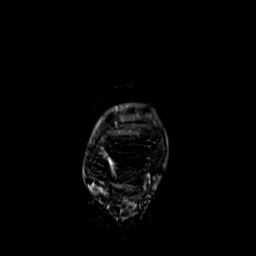
[im 35/40]
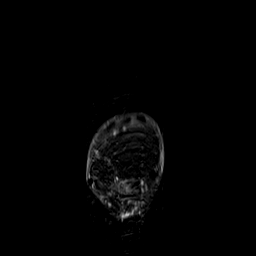
[im 40/40]
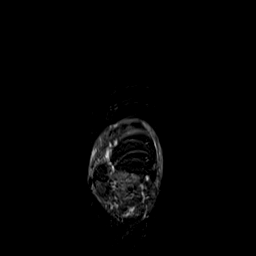

[Series 4: T2 fat-sat · coronal · 3.0mm · 0.70mm/px · 10 of 45 slices shown (2 of 2)]
[im 1/45]
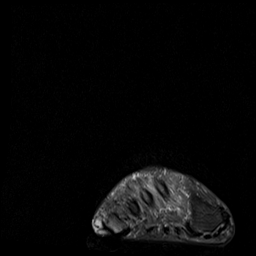
[im 5/45]
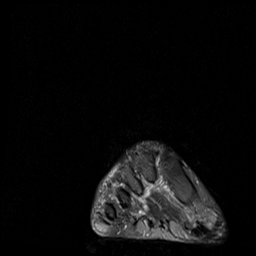
[im 10/45]
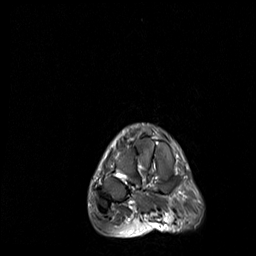
[im 15/45]
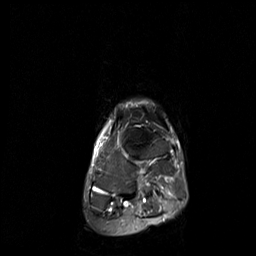
[im 20/45]
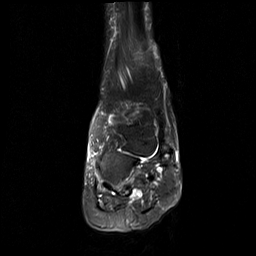
[im 25/45]
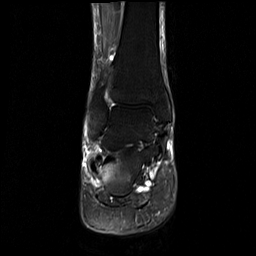
[im 30/45]
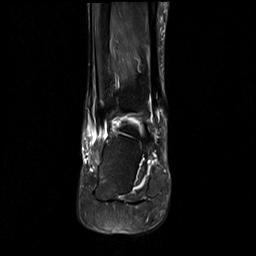
[im 35/45]
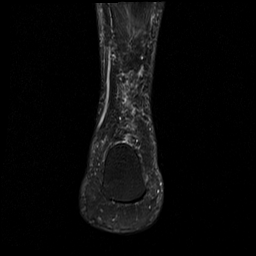
[im 40/45]
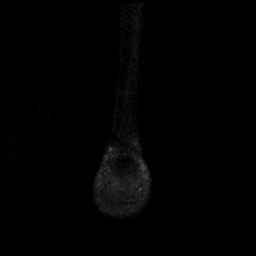
[im 45/45]
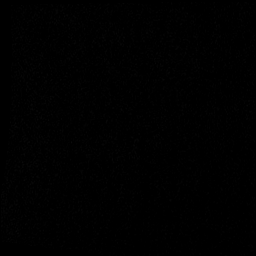

[Series 5: PD fat-sat · axial · 3.0mm · 0.70mm/px · z∈[-142,-14]mm · 9 of 40 slices shown]
[im 1/40]
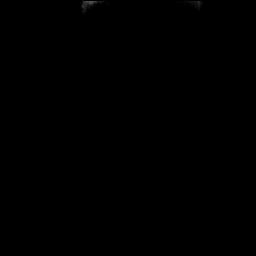
[im 5/40]
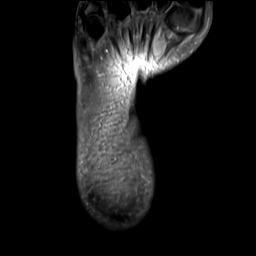
[im 10/40]
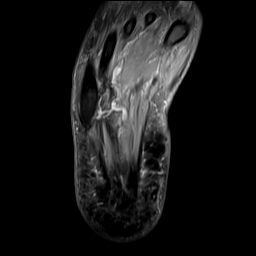
[im 15/40]
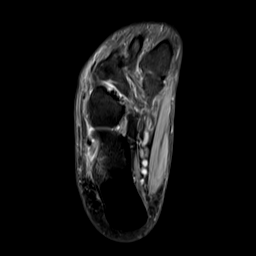
[im 20/40]
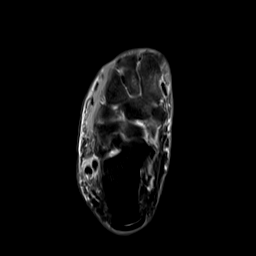
[im 25/40]
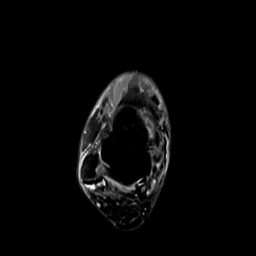
[im 30/40]
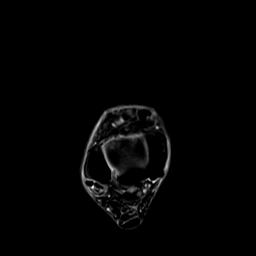
[im 35/40]
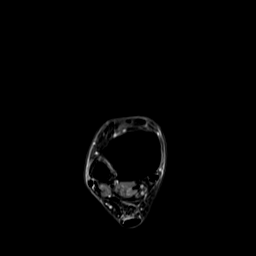
[im 40/40]
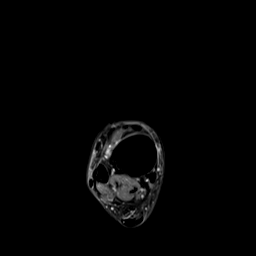

[Series 6: STIR · sagittal · 3.0mm · 0.70mm/px · 6 of 24 slices shown]
[im 1/24]
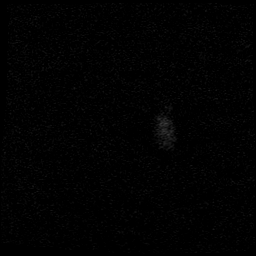
[im 5/24]
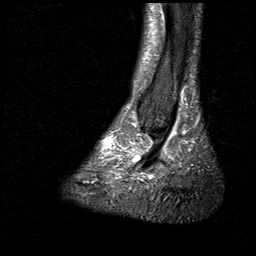
[im 10/24]
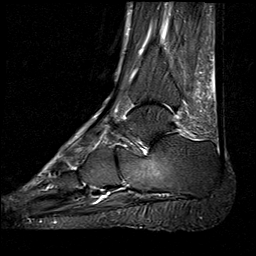
[im 14/24]
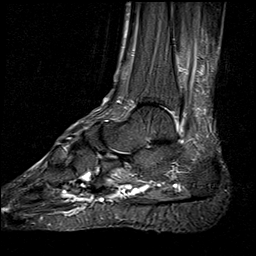
[im 19/24]
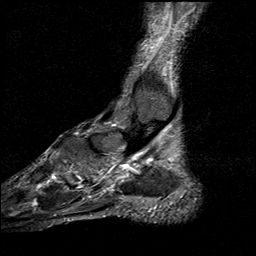
[im 24/24]
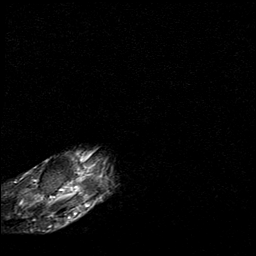

[Series 7: T1 · sagittal · 3.0mm · 0.56mm/px · 6 of 25 slices shown]
[im 1/25]
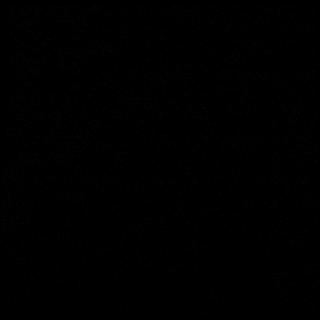
[im 5/25]
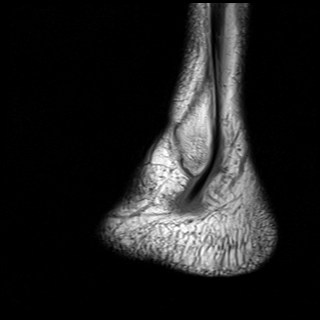
[im 10/25]
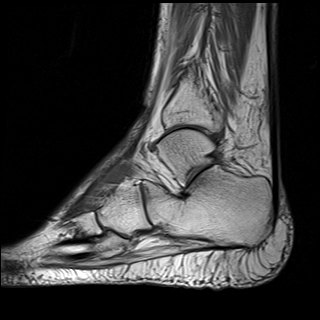
[im 15/25]
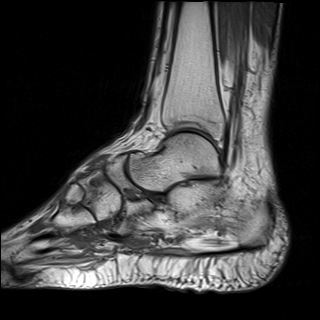
[im 20/25]
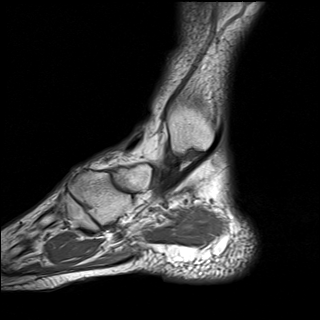
[im 25/25]
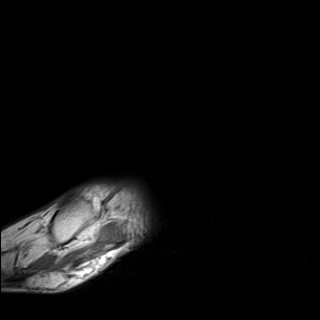

[40 of 40 positions shown; findings below may reference images not displayed]

FINDINGS: Despite efforts by the technologist and patient, motion artifact is
present on today's exam and could not be eliminated. This reduces
exam sensitivity and specificity.

TENDONS

Peroneal: Mild peroneus longus and brevis tenosynovitis. Within the
limitations of patient motion artifact, no definitive tendon tear is
seen.

Posteromedial: Within the limitations of patient motion artifact, no
definite tear is seen within the posterior tibial, flexor digitorum
longus or flexor hallucis longus tendon.

Anterior: Minimal distal tibialis anterior tenosynovitis. The
extensor hallucis longus and extensor digitorum longus tendons are
intact.

Achilles: Intact.

Plantar Fascia: Intact.

LIGAMENTS

Lateral: The anterior and posterior talofibular, anterior and
posterior tibiofibular, and calcaneofibular ligaments are grossly
intact.

Medial: The tibiotalar deep deltoid and tibial spring ligaments are
intact.

CARTILAGE

Ankle Joint: Within the limitations of patient motion artifact,
there appears to be mild subchondral marrow edema within the
anterior medial tibial plafond there may be mild cartilage thinning
in this region (coronal series 4 images twenty-two and 23).

Subtalar Joints/Sinus Tarsi: Fat is preserved within sinus tarsi.

Bones: There is moderate marrow edema within the lateral calcaneus
at the peroneal tubercle.

Other: The tarsal tunnel is unremarkable. The Lisfranc ligament
complex appears intact.
IMPRESSION: 1. Unfortunately, patient motion artifact moderately technically
degrades this examination.
2. Mild peroneus longus and brevis tenosynovitis. Moderate marrow
edema within the adjacent peroneal tubercle at the level of the mid
height of the calcaneus, likely stress related/reactive. Motion
artifact limits evaluation for a possible partial-thickness peroneus
longus or brevis tendon tear.
3. Mild subchondral marrow edema within the anteromedial aspect of
the tibial plafond. Within the limitations of motion artifact, there
may be mild cartilage thinning in this region. No osteochondral
defect is seen.

## 2021-11-09 IMAGING — MR MR ANKLE*L* W/O CM
5 series · 40 of 40 positions shown · non-contrast
Comparison: Left ankle and foot radiographs [DATE]

CLINICAL DATA: Ankle pain.  Osteochondral lesion suspected.

EXAM:
MRI OF THE LEFT ANKLE WITHOUT CONTRAST
TECHNIQUE: Multiplanar, multisequence MR imaging of the ankle was performed. No
intravenous contrast was administered.

[Series 3: T2 fat-sat · axial · 3.0mm · 0.70mm/px · z∈[-136,-8]mm · 9 of 40 slices shown (1 of 2)]
[im 1/40]
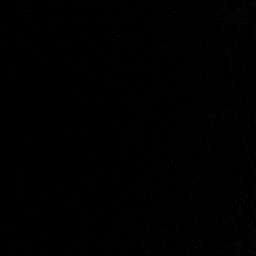
[im 5/40]
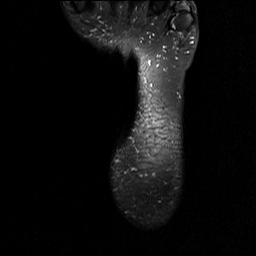
[im 10/40]
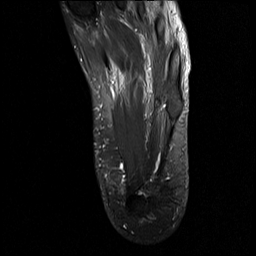
[im 15/40]
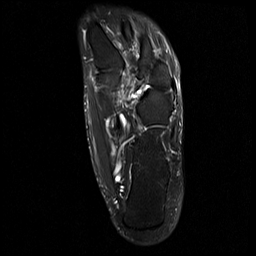
[im 20/40]
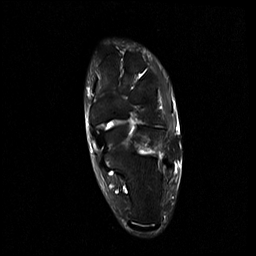
[im 25/40]
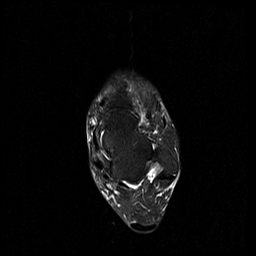
[im 30/40]
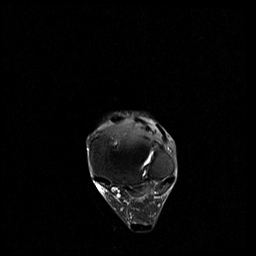
[im 35/40]
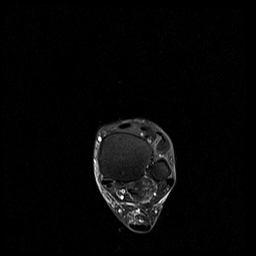
[im 40/40]
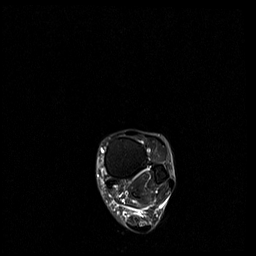

[Series 4: T2 fat-sat · coronal · 3.0mm · 0.70mm/px · 10 of 45 slices shown (2 of 2)]
[im 1/45]
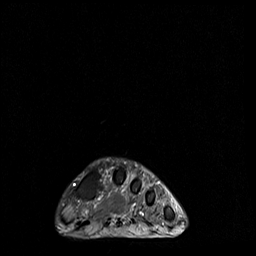
[im 5/45]
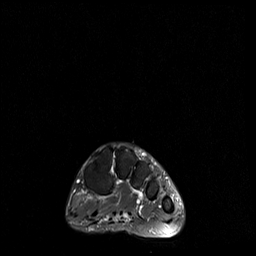
[im 10/45]
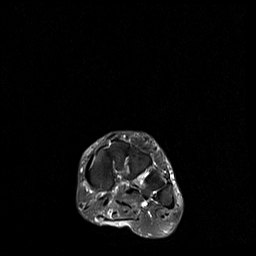
[im 15/45]
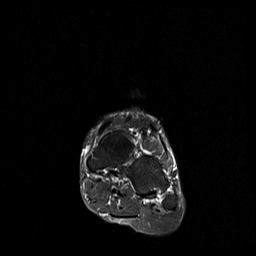
[im 20/45]
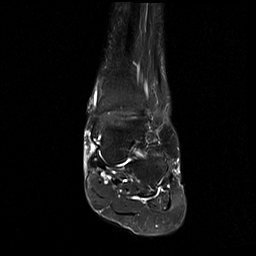
[im 25/45]
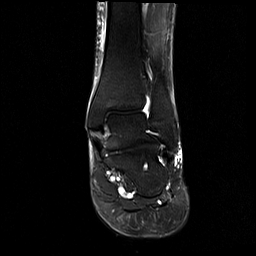
[im 30/45]
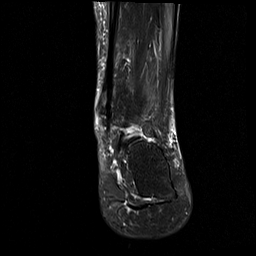
[im 35/45]
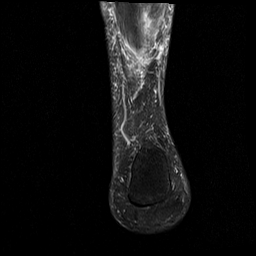
[im 40/45]
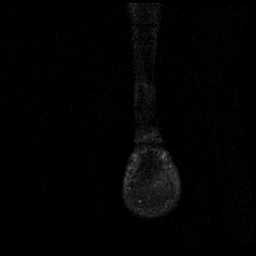
[im 45/45]
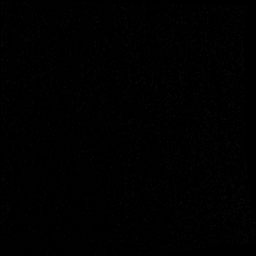

[Series 5: T1 · sagittal · 3.0mm · 0.56mm/px · 6 of 25 slices shown]
[im 1/25]
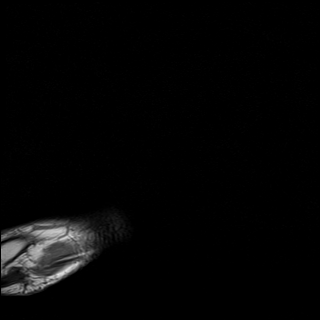
[im 5/25]
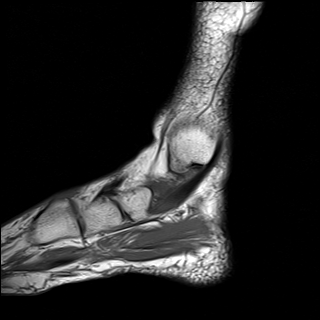
[im 10/25]
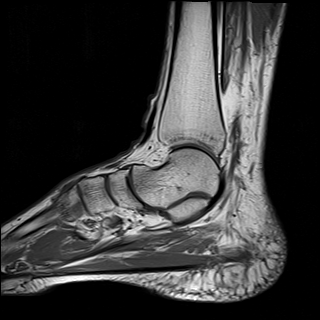
[im 15/25]
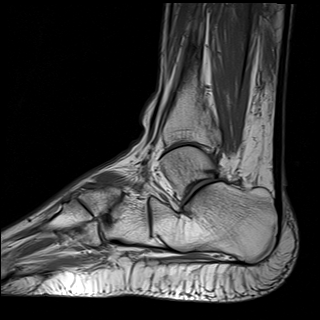
[im 20/25]
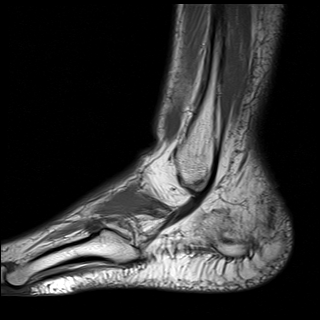
[im 25/25]
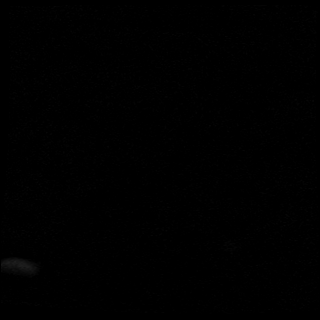

[Series 6: PD fat-sat · axial · 3.0mm · 0.70mm/px · z∈[-136,-8]mm · 9 of 40 slices shown]
[im 1/40]
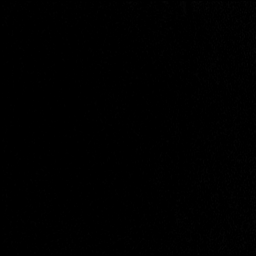
[im 5/40]
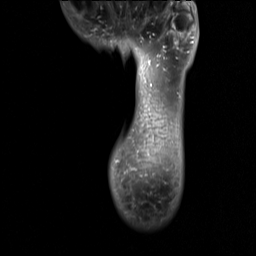
[im 10/40]
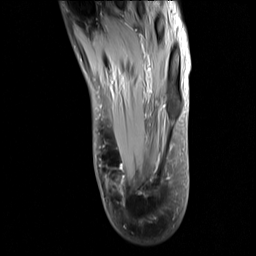
[im 15/40]
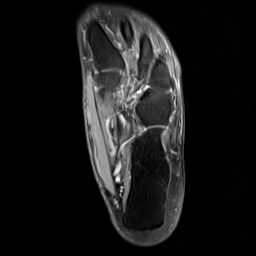
[im 20/40]
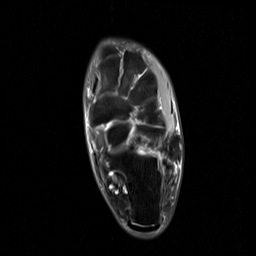
[im 25/40]
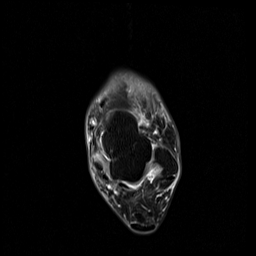
[im 30/40]
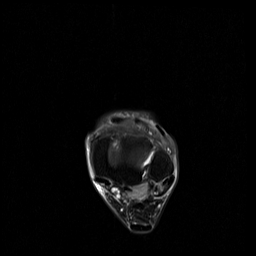
[im 35/40]
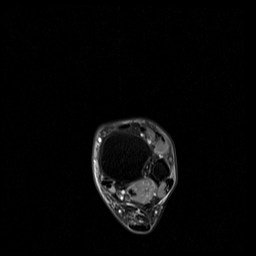
[im 40/40]
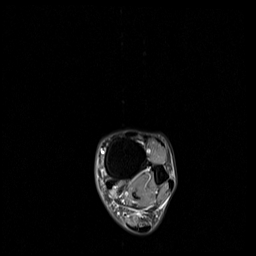

[Series 7: STIR · sagittal · 3.0mm · 0.70mm/px · 6 of 25 slices shown]
[im 1/25]
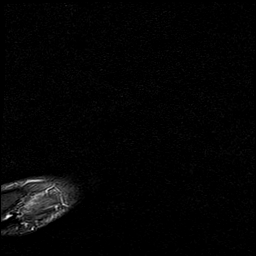
[im 5/25]
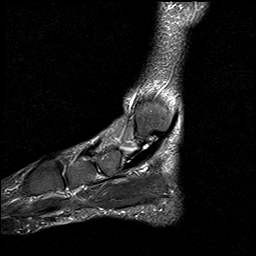
[im 10/25]
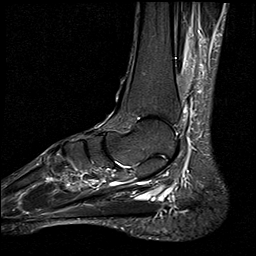
[im 15/25]
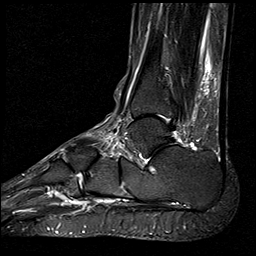
[im 20/25]
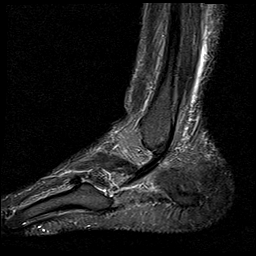
[im 25/25]
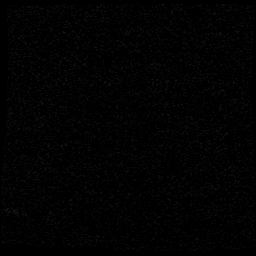

[40 of 40 positions shown; findings below may reference images not displayed]

FINDINGS: TENDONS

Peroneal: The peroneus longus and brevis tendons are intact.

Posteromedial: Intact tibialis posterior, flexor digitorum longus,
and flexor hallucis longus tendons.

Anterior: The tibialis anterior, extensor hallucis longus, and
extensor digitorum longus tendons are intact.

Achilles: Intact.

Plantar Fascia: Intact.

LIGAMENTS

Lateral: The anterior and posterior talofibular, anterior and
posterior tibiofibular, and calcaneofibular ligaments are intact.

Medial: The tibiotalar deep deltoid and tibial spring ligaments are
intact.

CARTILAGE

Ankle Joint: Intact cartilage.

Subtalar Joints/Sinus Tarsi: Fat is preserved within sinus tarsi.

Bones: Mild talonavicular, calcaneocuboid, navicular-cuneiform and
tarsometatarsal cartilage thinning.

No subchondral marrow edema or osteochondral defect.

Other: The tarsal tunnel is unremarkable. The Lisfranc ligament
complex is intact.
IMPRESSION: 1. No osteochondral defect is seen.
2. No acute abnormality.
3. Mild midfoot osteoarthritis.

## 2021-11-13 ENCOUNTER — Ambulatory Visit: Payer: Medicare HMO | Admitting: Family Medicine

## 2021-11-13 ENCOUNTER — Encounter: Payer: Self-pay | Admitting: Family Medicine

## 2021-11-13 VITALS — BP 130/82 | Ht 63.0 in | Wt 172.0 lb

## 2021-11-13 DIAGNOSIS — M84374D Stress fracture, right foot, subsequent encounter for fracture with routine healing: Secondary | ICD-10-CM

## 2021-11-13 DIAGNOSIS — M19072 Primary osteoarthritis, left ankle and foot: Secondary | ICD-10-CM

## 2021-11-13 NOTE — Assessment & Plan Note (Signed)
Acutely occurring.  Having minimal changes within the midfoot. -Counseled on home exercise therapy and supportive care. -Could consider physical therapy or injection.

## 2021-11-13 NOTE — Assessment & Plan Note (Signed)
Acutely occurring.  MRI was demonstrating stress related changes within the calcaneus. -Counseled on home exercise therapy and supportive care. -Counseled on CAM Walker and nonweightbearing. -Written prescription for rollator.

## 2021-11-13 NOTE — Progress Notes (Signed)
  Caroline Simmons - 66 y.o. female MRN [id number]  Date of birth: 07/26/1955  SUBJECTIVE:  Including CC & ROS.  No chief complaint on file.   Caroline Simmons is a 66 y.o. female that is following up for the MRI of her right left ankle.  The MRI of the right ankle was demonstrating subchondral marrow edema within the tibial plafond as well as stress changes in the calcaneus.  Left ankle MRI was demonstrating midfoot arthritis.   Review of Systems See HPI   HISTORY: Past Medical, Surgical, Social, and Family History Reviewed & Updated per EMR.   Pertinent Historical Findings include:  Past Medical History:  Diagnosis Date   Alopecia    Anxiety    on meds   Arthritis    back   Cataract 2011   bilateral removal   Chronic kidney disease (CKD), stage III (moderate) (HCC)    Depression    on meds   Essential hypertension, benign    on meds   GERD (gastroesophageal reflux disease)    on meds   Hyperlipidemia    on meds   Hypothyroid    on meds   Insomnia, unspecified    Menopause    lmp 06/2007   Neuropathy    per pt   Type II or unspecified type diabetes mellitus without mention of complication, not stated as uncontrolled    on meds   Vitamin D deficiency     Past Surgical History:  Procedure Laterality Date   CATARACT EXTRACTION, BILATERAL     CESAREAN SECTION     1982/1991   COLONOSCOPY  2012   [place]-F/V-movi(exc)-HPP   POLYPECTOMY  2012   HPP   RETINAL DETACHMENT REPAIR W/ SCLERAL BUCKLE LE  11/12/2011   WISDOM TOOTH EXTRACTION       PHYSICAL EXAM:  VS: BP 130/82 (BP Location: Right Arm, Patient Position: Sitting)   Ht '5\' 3"'$  (1.6 m)   Wt 172 lb (78 kg)   BMI 30.47 kg/m  Physical Exam Gen: NAD, alert, cooperative with exam, well-appearing MSK:  Neurovascularly intact       ASSESSMENT & PLAN:   Stress fracture of calcaneus Acutely occurring.  MRI was demonstrating stress related changes within the calcaneus. -Counseled on home exercise therapy  and supportive care. -Counseled on CAM Walker and nonweightbearing. -Written prescription for rollator.  Arthrosis of left midfoot Acutely occurring.  Having minimal changes within the midfoot. -Counseled on home exercise therapy and supportive care. -Could consider physical therapy or injection.

## 2021-11-13 NOTE — Patient Instructions (Signed)
Good to see you Please try to use a walker. Please take vitamin D3, calcium and vitamin K2 Please send me a message in MyChart with any questions or updates.  Please see me back in 4 weeks.   --Dr. Raeford Razor

## 2021-11-19 ENCOUNTER — Telehealth: Payer: Self-pay | Admitting: Family Medicine

## 2021-11-19 NOTE — Telephone Encounter (Signed)
Patient called states she went to [facility] & found a diff walker instead of the prescribed one.  --- (Pt ask for new Rx to be sent to [facility] @ [phone] for a rolling knee walker).  --Forwarding request to med asst for review with provider.  -glh

## 2021-11-24 NOTE — Telephone Encounter (Signed)
Rx faxed to number below. Left detailed message informing pt.

## 2021-12-02 NOTE — Progress Notes (Addendum)
[place] [facility] [address] [place], [place] [address] [phone]  Date:  12/04/2021   Name:  Caroline Simmons   DOB:  11-05-1955   MRN:  [id number]  PCP:  Darreld Mclean, MD    Chief Complaint: 6 month follow up (Concerns/ questions: dizziness on occasion /)   History of Present Illness:  Caroline Simmons is a 66 y.o. very pleasant female patient who presents with the following:  Patient seen today for periodic follow-up History of diet controlled diabetes with neuropathy, hypertension, hyperlipidemia, chronic kidney disease, hypothyroidism. Most recent visit with myself was in March, when she was seen with an ankle injury  She was seen by nephrology in May, followed up with her neurologist in March At her recent nephrology visit they had her stop amlodipine and start HCTZ 12.5 due to lower extremity edema.  She does feel that this is helping  She is currently being seen by sports medicine, Dr. Raeford Razor for right ankle pain.  It looks like she has stress related changes though not necessarily a stress fracture in her right calcaneus.  She is wearing a walking boot and has a knee scooter Seen today with her daughter, she is not currently driving  Eye exam- this is due, she is aware  COVID-19 booster-recommended Shingles vaccine-recommended Due for A1c, TSH Lab Results  Component Value Date   TSH 1.83 06/04/2021    Lab Results  Component Value Date   HGBA1C 7.0 (H) 06/04/2021   Amitriptyline at bedtime Tegretol 200 twice daily Amlodipine 2.5- this was stopped and HCTZ added by nephrology  Aspirin 81 Levothyroxine Lisinopril 20 Crestor 20  Wt Readings from Last 3 Encounters:  12/04/21 168 lb 9.6 oz (76.5 kg)  11/13/21 172 lb (78 kg)  10/27/21 172 lb (78 kg)    Patient Active Problem List   Diagnosis Date Noted   Arthrosis of left midfoot 10/27/2021   Stress fracture of calcaneus 09/02/2021   Pes cavus  of both feet 09/02/2021   Abnormal urinalysis 09/14/2019   Vitamin D deficiency 08/10/2017   Secondary hyperparathyroidism of renal origin ([place]) 07/30/2016   Chronic kidney disease, stage 3 ([place]) 04/08/2016   Dyslipidemia 04/02/2016   Proteinuria 04/02/2016   Fatigue 12/25/2013   Neuropathy 12/25/2013   Dyslipidemia associated with type 2 diabetes mellitus ([place]) 05/30/2013   Detached retina, left 11/25/2011   Depression with anxiety 11/25/2011   Neuropathy in diabetes ([place]) 09/15/2011   Insomnia, unspecified    Hypothyroidism    DIABETES MELLITUS, TYPE II 08/26/2010   Essential hypertension 08/26/2010   RECTAL PAIN 08/26/2010   Peripheral motor neuropathy 06/22/2009    Past Medical History:  Diagnosis Date   Alopecia    Anxiety    on meds   Arthritis    back   Cataract 2011   bilateral removal   Chronic kidney disease (CKD), stage III (moderate) (HCC)    Depression    on meds   Essential hypertension, benign    on meds   GERD (gastroesophageal reflux disease)    on meds   Hyperlipidemia    on meds   Hypothyroid    on meds   Insomnia, unspecified    Menopause    lmp 06/2007   Neuropathy    per pt   Type II or unspecified type diabetes mellitus without mention of complication, not stated as uncontrolled    on meds   Vitamin D deficiency  Past Surgical History:  Procedure Laterality Date   CATARACT EXTRACTION, BILATERAL     CESAREAN SECTION     1982/1991   COLONOSCOPY  2012   [place]-F/V-movi(exc)-HPP   POLYPECTOMY  2012   HPP   RETINAL DETACHMENT REPAIR W/ SCLERAL BUCKLE LE  11/12/2011   WISDOM TOOTH EXTRACTION      Social History   Tobacco Use   Smoking status: Former    Packs/day: 1.00    Years: 3.00    Total pack years: 3.00    Types: Cigarettes   Smokeless tobacco: Never   Tobacco comments:    quit 08/2003  Vaping Use   Vaping Use: Never used  Substance Use Topics   Alcohol use: Not Currently   Drug use: No    Family History   Problem Relation Age of Onset   Diabetes Mother        type 2  not sure onset   Diabetes Other    Hypertension Other    Colon cancer Neg Hx    Colon polyps Neg Hx    Esophageal cancer Neg Hx    Stomach cancer Neg Hx    Rectal cancer Neg Hx     Allergies  Allergen Reactions   Codeine Nausea Only    Pt states she has taken this with no problems.   Zoloft  [Sertraline Hcl]    Lamotrigine Rash    Other reaction(s): RASH    Medication list has been reviewed and updated.  Current Outpatient Medications on File Prior to Visit  Medication Sig Dispense Refill   ACCU-CHEK AVIVA PLUS test strip USE  1  TEST  STRIP EVERY DAY 100 each 5   Alcohol Swabs (B-D SINGLE USE SWABS REGULAR) PADS USE  1  SWAB EVERY DAY 100 each 3   amitriptyline (ELAVIL) 100 MG tablet TAKE 1 TABLET BY MOUTH EVERYDAY AT BEDTIME 90 tablet 3   amitriptyline (ELAVIL) 25 MG tablet Take 1 tablet (25 mg total) by mouth daily. 90 tablet 3   aspirin 81 MG tablet Take 81 mg by mouth daily.     carbamazepine (TEGRETOL) 200 MG tablet Take 1 tablet (200 mg total) by mouth in the morning and at bedtime. 180 tablet 3   finasteride (PROSCAR) 5 MG tablet      HYDROcodone-acetaminophen (NORCO/VICODIN) 5-325 MG tablet Take 1 tablet by mouth every 8 (eight) hours as needed. 8 tablet 0   levothyroxine (SYNTHROID) 50 MCG tablet Take 1 tablet (50 mcg total) by mouth daily. 90 tablet 3   lisinopril (ZESTRIL) 20 MG tablet Take 1 tablet (20 mg total) by mouth daily. 90 tablet 3   omeprazole (PRILOSEC) 20 MG capsule TAKE 1- 2 CAPSULES ONCE A DAY FOR NAUSEA AND REFLUX. USE AS NEEDED- DOES NOT HAVE TO TAKE EVERY DAY 180 capsule 1   rosuvastatin (CRESTOR) 20 MG tablet TAKE 1 TABLET EVERY DAY 90 tablet 1   sodium bicarbonate 650 MG tablet Take 650 mg by mouth 2 (two) times daily.     hydrochlorothiazide (MICROZIDE) 12.5 MG capsule Take 12.5 mg by mouth daily.     No current facility-administered medications on file prior to visit.    Review  of Systems:  As per HPI- otherwise negative.   Physical Examination: Vitals:   12/04/21 1556  BP: (!) 142/88  Pulse: 73  Resp: 18  Temp: 97.8 F (36.6 C)  SpO2: 98%   Vitals:   12/04/21 1556  Weight: 168 lb 9.6 oz (76.5 kg)  Height: '5\' 3"'$  (1.6 m)   Body mass index is 29.87 kg/m. Ideal Body Weight: Weight in (lb) to have BMI = 25: 140.8  GEN: no acute distress.  Overweight, looks well HEENT: Atraumatic, Normocephalic.  Ears and Nose: No external deformity. CV: RRR, No M/G/R. No JVD. No thrill. No extra heart sounds. PULM: CTA B, no wheezes, crackles, rhonchi. No retractions. No resp. distress. No accessory muscle use. ABD: S, NT, ND, +BS. No rebound. No HSM. EXTR: No c/c/minimal edema is present at both ankles, slightly worse on the right PSYCH: Normally interactive. Conversant.  Patient has tenderness over the talus of the right foot, wearing walking boot  Assessment and Plan: Controlled type 2 diabetes mellitus with diabetic polyneuropathy, without long-term current use of insulin ([place]) - Plan: Hemoglobin W4X, Basic metabolic panel  Dyslipidemia - Plan: Lipid panel  Hypothyroidism due to acquired atrophy of thyroid - Plan: TSH  Essential hypertension - Plan: Basic metabolic panel, CBC  Following up today.  Await A1c to check on diabetes control Blood pressure under adequate control, continue HCTZ and lisinopril Check TSH today, adjust levothyroxine if needed Taking Crestor 20, check lipids Recommend needed immunizations Follow-up in 6 months  Signed Lamar Blinks, MD  Addendum 6/16, received labs as below.  Letter to patient Results for orders placed or performed in visit on 12/04/21  Hemoglobin A1c  Result Value Ref Range   Hgb A1c MFr Bld 7.4 (H) 4.6 - 6.5 %  TSH  Result Value Ref Range   TSH 0.96 0.35 - 5.50 uIU/mL  Basic metabolic panel  Result Value Ref Range   Sodium 139 135 - 145 mEq/L   Potassium 4.2 3.5 - 5.1 mEq/L   Chloride 100 96 - 112  mEq/L   CO2 28 19 - 32 mEq/L   Glucose, Bld 123 (H) 70 - 99 mg/dL   BUN 27 (H) 6 - 23 mg/dL   Creatinine, Ser 1.70 (H) 0.40 - 1.20 mg/dL   GFR 31.33 (L) >60.00 mL/min   Calcium 9.8 8.4 - 10.5 mg/dL  CBC  Result Value Ref Range   WBC 6.8 4.0 - 10.5 K/uL   RBC 4.13 3.87 - 5.11 Mil/uL   Platelets 193.0 150.0 - 400.0 K/uL   Hemoglobin 12.7 12.0 - 15.0 g/dL   HCT 37.9 36.0 - 46.0 %   MCV 91.9 78.0 - 100.0 fl   MCHC 33.5 30.0 - 36.0 g/dL   RDW 13.3 11.5 - 15.5 %  Lipid panel  Result Value Ref Range   Cholesterol 168 0 - 200 mg/dL   Triglycerides 193.0 (H) 0.0 - 149.0 mg/dL   HDL 63.80 >39.00 mg/dL   VLDL 38.6 0.0 - 40.0 mg/dL   LDL Cholesterol 66 0 - 99 mg/dL   Total CHOL/HDL Ratio 3    NonHDL 104.52    Renal function is stable since 2021+

## 2021-12-02 NOTE — Patient Instructions (Addendum)
It was great to see you again today, I will be in touch with your labs It looks like you may be due for an eye exam Also COVID-19 booster and shingles series-both can be given at your pharmacy at your convenience Your BP is reasonable with your medication change- continue current regimen  Take care!  Please see me in 6 months assuming all is well

## 2021-12-03 ENCOUNTER — Ambulatory Visit: Payer: Medicare HMO | Admitting: Family Medicine

## 2021-12-04 ENCOUNTER — Ambulatory Visit (INDEPENDENT_AMBULATORY_CARE_PROVIDER_SITE_OTHER): Payer: Medicare HMO | Admitting: Family Medicine

## 2021-12-04 VITALS — BP 142/88 | HR 73 | Temp 97.8°F | Resp 18 | Ht 63.0 in | Wt 168.6 lb

## 2021-12-04 DIAGNOSIS — E785 Hyperlipidemia, unspecified: Secondary | ICD-10-CM | POA: Diagnosis not present

## 2021-12-04 DIAGNOSIS — E034 Atrophy of thyroid (acquired): Secondary | ICD-10-CM | POA: Diagnosis not present

## 2021-12-04 DIAGNOSIS — I10 Essential (primary) hypertension: Secondary | ICD-10-CM

## 2021-12-04 DIAGNOSIS — E1142 Type 2 diabetes mellitus with diabetic polyneuropathy: Secondary | ICD-10-CM

## 2021-12-05 LAB — LIPID PANEL
Cholesterol: 168 mg/dL (ref 0–200)
HDL: 63.8 mg/dL (ref >39.00)
LDL Cholesterol: 66 mg/dL (ref 0–99)
NonHDL: 104.52
Total CHOL/HDL Ratio: 3
Triglycerides: 193 mg/dL — ABNORMAL HIGH (ref 0.0–149.0)
VLDL: 38.6 mg/dL (ref 0.0–40.0)

## 2021-12-05 LAB — BASIC METABOLIC PANEL
BUN: 27 mg/dL — ABNORMAL HIGH (ref 6–23)
CO2: 28 mEq/L (ref 19–32)
Calcium: 9.8 mg/dL (ref 8.4–10.5)
Chloride: 100 mEq/L (ref 96–112)
Creatinine, Ser: 1.7 mg/dL — ABNORMAL HIGH (ref 0.40–1.20)
GFR: 31.33 mL/min — ABNORMAL LOW (ref >60.00)
Glucose, Bld: 123 mg/dL — ABNORMAL HIGH (ref 70–99)
Potassium: 4.2 mEq/L (ref 3.5–5.1)
Sodium: 139 mEq/L (ref 135–145)

## 2021-12-05 LAB — CBC
HCT: 37.9 % (ref 36.0–46.0)
Hemoglobin: 12.7 g/dL (ref 12.0–15.0)
MCHC: 33.5 g/dL (ref 30.0–36.0)
MCV: 91.9 fl (ref 78.0–100.0)
Platelets: 193 10*3/uL (ref 150.0–400.0)
RBC: 4.13 Mil/uL (ref 3.87–5.11)
RDW: 13.3 % (ref 11.5–15.5)
WBC: 6.8 10*3/uL (ref 4.0–10.5)

## 2021-12-05 LAB — TSH: TSH: 0.96 u[IU]/mL (ref 0.35–5.50)

## 2021-12-05 LAB — HEMOGLOBIN A1C: Hgb A1c MFr Bld: 7.4 % — ABNORMAL HIGH (ref 4.6–6.5)

## 2021-12-11 ENCOUNTER — Ambulatory Visit: Payer: Medicare HMO | Admitting: Family Medicine

## 2021-12-11 ENCOUNTER — Encounter: Payer: Self-pay | Admitting: Family Medicine

## 2021-12-11 VITALS — BP 110/88 | Ht 63.0 in | Wt 168.0 lb

## 2021-12-11 DIAGNOSIS — M84374D Stress fracture, right foot, subsequent encounter for fracture with routine healing: Secondary | ICD-10-CM

## 2021-12-11 DIAGNOSIS — M19072 Primary osteoarthritis, left ankle and foot: Secondary | ICD-10-CM | POA: Diagnosis not present

## 2021-12-11 NOTE — Assessment & Plan Note (Signed)
Acutely occurring.  Does have some swelling in the midfoot and pain in the hindfoot.  May have a component of fat pad irritation with using the cam walker as she does have a fairly cavus foot. -Counseled on home exercise therapy and supportive care. -Referral to physical therapy. -Green sport insoles. -Could consider injection.

## 2021-12-11 NOTE — Progress Notes (Signed)
  Caroline Simmons - 66 y.o. female MRN [id number]  Date of birth: [birth year]  SUBJECTIVE:  Including CC & ROS.  No chief complaint on file.   Caroline Simmons is a 66 y.o. female that is following up for her right foot pain.  She continues to have pain in the lateral hindfoot.  Having some swelling in the midfoot.   Review of Systems See HPI   HISTORY: Past Medical, Surgical, Social, and Family History Reviewed & Updated per EMR.   Pertinent Historical Findings include:  Past Medical History:  Diagnosis Date   Alopecia    Anxiety    on meds   Arthritis    back   Cataract 2011   bilateral removal   Chronic kidney disease (CKD), stage III (moderate) (HCC)    Depression    on meds   Essential hypertension, benign    on meds   GERD (gastroesophageal reflux disease)    on meds   Hyperlipidemia    on meds   Hypothyroid    on meds   Insomnia, unspecified    Menopause    lmp 06/2007   Neuropathy    per pt   Type II or unspecified type diabetes mellitus without mention of complication, not stated as uncontrolled    on meds   Vitamin D deficiency     Past Surgical History:  Procedure Laterality Date   CATARACT EXTRACTION, BILATERAL     CESAREAN SECTION     1982/1991   COLONOSCOPY  2012   [place]-F/V-movi(exc)-HPP   POLYPECTOMY  2012   HPP   RETINAL DETACHMENT REPAIR W/ SCLERAL BUCKLE LE  11/12/2011   WISDOM TOOTH EXTRACTION       PHYSICAL EXAM:  VS: BP 110/88 (BP Location: Left Arm, Patient Position: Sitting)   Ht '5\' 3"'$  (1.6 m)   Wt 168 lb (76.2 kg)   BMI 29.76 kg/m  Physical Exam Gen: NAD, alert, cooperative with exam, well-appearing MSK:  Neurovascularly intact       ASSESSMENT & PLAN:   Arthrosis of left midfoot Acutely occurring.  Does have some swelling in the midfoot and pain in the hindfoot.  May have a component of fat pad irritation with using the cam walker as she does have a fairly cavus foot. -Counseled on home exercise therapy and  supportive care. -Referral to physical therapy. -Green sport insoles. -Could consider injection.  Stress fracture of calcaneus Having improvement of the pain of the calcaneus.  Still notices pain with weightbearing. -Counseled on home exercise therapy and supportive care. -Counseled on weaning out of the cam walker. -Referral to physical therapy. -Could consider custom orthotics.

## 2021-12-11 NOTE — Assessment & Plan Note (Signed)
Having improvement of the pain of the calcaneus.  Still notices pain with weightbearing. -Counseled on home exercise therapy and supportive care. -Counseled on weaning out of the cam walker. -Referral to physical therapy. -Could consider custom orthotics.

## 2021-12-11 NOTE — Patient Instructions (Signed)
Good to see you You can try to go without the boot but always use shoes and put the green insoles in the shoes. We will try some physical therapy. Please send me a message in MyChart with any questions or updates.  Please see me back in 4-6 weeks.   --Dr. Raeford Razor

## 2021-12-16 ENCOUNTER — Ambulatory Visit: Payer: Medicare HMO | Admitting: Family Medicine

## 2021-12-17 ENCOUNTER — Encounter: Payer: Self-pay | Admitting: Family Medicine

## 2021-12-17 ENCOUNTER — Ambulatory Visit: Payer: Self-pay

## 2021-12-17 ENCOUNTER — Ambulatory Visit: Payer: Medicare HMO | Admitting: Family Medicine

## 2021-12-17 VITALS — BP 142/88 | Ht 63.0 in | Wt 167.0 lb

## 2021-12-17 DIAGNOSIS — M79671 Pain in right foot: Secondary | ICD-10-CM

## 2021-12-17 DIAGNOSIS — M659 Synovitis and tenosynovitis, unspecified: Secondary | ICD-10-CM | POA: Diagnosis not present

## 2021-12-17 MED ORDER — BETAMETHASONE SOD PHOS & ACET 6 (3-3) MG/ML IJ SUSP
6.0000 mg | Freq: Once | INTRAMUSCULAR | Status: AC
  Administered 2021-12-17: 6 mg via INTRA_ARTICULAR

## 2021-12-17 NOTE — Progress Notes (Signed)
Caroline Simmons - 66 y.o. female MRN [id number]  Date of birth: [birth year]  SUBJECTIVE:  Including CC & ROS.  No chief complaint on file.   Caroline Simmons is a 66 y.o. female that is presenting with acute worsening of her right ankle pain.  The pain is severe in nature.  No improvement with the cam walker.  Having any pain with weightbearing.   Review of Systems See HPI   HISTORY: Past Medical, Surgical, Social, and Family History Reviewed & Updated per EMR.   Pertinent Historical Findings include:  Past Medical History:  Diagnosis Date   Alopecia    Anxiety    on meds   Arthritis    back   Cataract 2011   bilateral removal   Chronic kidney disease (CKD), stage III (moderate) (HCC)    Depression    on meds   Essential hypertension, benign    on meds   GERD (gastroesophageal reflux disease)    on meds   Hyperlipidemia    on meds   Hypothyroid    on meds   Insomnia, unspecified    Menopause    lmp 06/2007   Neuropathy    per pt   Type II or unspecified type diabetes mellitus without mention of complication, not stated as uncontrolled    on meds   Vitamin D deficiency     Past Surgical History:  Procedure Laterality Date   CATARACT EXTRACTION, BILATERAL     CESAREAN SECTION     1982/1991   COLONOSCOPY  2012   [facility]-F/V-movi(exc)-HPP   POLYPECTOMY  2012   HPP   RETINAL DETACHMENT REPAIR W/ SCLERAL BUCKLE LE  11/12/2011   WISDOM TOOTH EXTRACTION       PHYSICAL EXAM:  VS: BP (!) 142/88 (BP Location: Right Arm, Patient Position: Sitting, Cuff Size: Normal)   Ht '5\' 3"'$  (1.6 m)   Wt 167 lb (75.8 kg)   BMI 29.58 kg/m  Physical Exam Gen: NAD, alert, cooperative with exam, well-appearing MSK:  Neurovascularly intact    Limited ultrasound: Right ankle:  Significant effusion and hyperemia surrounding the peroneal tendons. Normal insertion of the peroneal brevis into the base of fifth metatarsal. Normal-appearing distal fibula.   Summary: Peroneal  tenosynovitis  Ultrasound and interpretation by Clearance Coots, MD  Aspiration/Injection Procedure Note Caroline Simmons [birth year]  Procedure: Injection Indications: Right peroneal tendon pain  Procedure Details Consent: Risks of procedure as well as the alternatives and risks of each were explained to the (patient/caregiver).  Consent for procedure obtained. Time Out: Verified patient identification, verified procedure, site/side was marked, verified correct patient position, special equipment/implants available, medications/allergies/relevent history reviewed, required imaging and test results available.  Performed.  The area was cleaned with iodine and alcohol swabs.    The right peroneal tendon sheath was injected using 2 cc of 1% lidocaine on a 25-gauge 1-1/2 inch needle.  The syringe was switched and a mixture containing 1 cc of 6 mg  betamethasone and2  cc's of 0.5% bupivacaine was injected.  Ultrasound was used. Images were obtained in long views showing the injection.     A sterile dressing was applied.  Patient did tolerate procedure well.     ASSESSMENT & PLAN:   Peroneal tenosynovitis Acutely occurring.  Having significant inflammatory changes and effusion within the peroneal tendon sheath. -Counseled on home exercise therapy and supportive care. -Injection today. -Counseled on no walking around barefoot and using the insoles with tennis shoes. -Could consider custom orthotics.

## 2021-12-17 NOTE — Patient Instructions (Signed)
Good to see you Please consider compression  Please use the insoles  Please do not walk barefoot   Please send me a message in MyChart with any questions or updates.  Please see me back in 4-8 weeks.   --Dr. Raeford Razor

## 2021-12-17 NOTE — Assessment & Plan Note (Signed)
Acutely occurring.  Having significant inflammatory changes and effusion within the peroneal tendon sheath. -Counseled on home exercise therapy and supportive care. -Injection today. -Counseled on no walking around barefoot and using the insoles with tennis shoes. -Could consider custom orthotics.

## 2021-12-29 NOTE — Progress Notes (Unsigned)
Subjective:   Caroline Simmons is a 66 y.o. female who presents for Medicare Annual (Subsequent) preventive examination.  Review of Systems           Objective:    There were no vitals filed for this visit. There is no height or weight on file to calculate BMI.     08/22/2021    1:36 PM 12/24/2020    1:04 PM 08/19/2020    2:36 PM 09/14/2019    8:04 AM 08/24/2019    2:57 PM 08/19/2018    2:14 PM 05/27/2016    2:22 PM  Advanced Directives  Does Patient Have a Medical Advance Directive? Yes Yes Yes Yes No Yes Yes  Type of Advance Directive Living will Living will Living will   [place];Living will Living will  Does patient want to make changes to medical advance directive?      No - Patient declined   Copy of [place] in Chart?      No - copy requested   Would patient like information on creating a medical advance directive?     No - Patient declined      Current Medications (verified) Outpatient Encounter Medications as of 12/30/2021  Medication Sig   ACCU-CHEK AVIVA PLUS test strip USE  1  TEST  STRIP EVERY DAY   Alcohol Swabs (B-D SINGLE USE SWABS REGULAR) PADS USE  1  SWAB EVERY DAY   amitriptyline (ELAVIL) 100 MG tablet TAKE 1 TABLET BY MOUTH EVERYDAY AT BEDTIME   amitriptyline (ELAVIL) 25 MG tablet Take 1 tablet (25 mg total) by mouth daily.   aspirin 81 MG tablet Take 81 mg by mouth daily.   carbamazepine (TEGRETOL) 200 MG tablet Take 1 tablet (200 mg total) by mouth in the morning and at bedtime.   finasteride (PROSCAR) 5 MG tablet    hydrochlorothiazide (MICROZIDE) 12.5 MG capsule Take 12.5 mg by mouth daily.   HYDROcodone-acetaminophen (NORCO/VICODIN) 5-325 MG tablet Take 1 tablet by mouth every 8 (eight) hours as needed.   levothyroxine (SYNTHROID) 50 MCG tablet Take 1 tablet (50 mcg total) by mouth daily.   lisinopril (ZESTRIL) 20 MG tablet Take 1 tablet (20 mg total) by mouth daily.   omeprazole (PRILOSEC) 20 MG capsule TAKE 1- 2  CAPSULES ONCE A DAY FOR NAUSEA AND REFLUX. USE AS NEEDED- DOES NOT HAVE TO TAKE EVERY DAY   rosuvastatin (CRESTOR) 20 MG tablet TAKE 1 TABLET EVERY DAY   sodium bicarbonate 650 MG tablet Take 650 mg by mouth 2 (two) times daily.   No facility-administered encounter medications on file as of 12/30/2021.    Allergies (verified) Codeine, Zoloft  [sertraline hcl], and Lamotrigine   History: Past Medical History:  Diagnosis Date   Alopecia    Anxiety    on meds   Arthritis    back   Cataract 2011   bilateral removal   Chronic kidney disease (CKD), stage III (moderate) (HCC)    Depression    on meds   Essential hypertension, benign    on meds   GERD (gastroesophageal reflux disease)    on meds   Hyperlipidemia    on meds   Hypothyroid    on meds   Insomnia, unspecified    Menopause    lmp 06/2007   Neuropathy    per pt   Type II or unspecified type diabetes mellitus without mention of complication, not stated as uncontrolled    on meds  Vitamin D deficiency    Past Surgical History:  Procedure Laterality Date   CATARACT EXTRACTION, BILATERAL     CESAREAN SECTION     1982/1991   COLONOSCOPY  2012   [facility]-F/V-movi(exc)-HPP   POLYPECTOMY  2012   HPP   RETINAL DETACHMENT REPAIR W/ SCLERAL BUCKLE LE  11/12/2011   WISDOM TOOTH EXTRACTION     Family History  Problem Relation Age of Onset   Diabetes Mother        type 2  not sure onset   Diabetes Other    Hypertension Other    Colon cancer Neg Hx    Colon polyps Neg Hx    Esophageal cancer Neg Hx    Stomach cancer Neg Hx    Rectal cancer Neg Hx    Social History   Socioeconomic History   Marital status: Divorced    Spouse name: Not on file   Number of children: 3   Years of education: 14   Highest education level: Not on file  Occupational History   Occupation: disabled  Tobacco Use   Smoking status: Former    Packs/day: 1.00    Years: 3.00    Total pack years: 3.00    Types: Cigarettes    Smokeless tobacco: Never   Tobacco comments:    quit 08/2003  Vaping Use   Vaping Use: Never used  Substance and Sexual Activity   Alcohol use: Not Currently   Drug use: No   Sexual activity: Not Currently  Other Topics Concern   Not on file  Social History Narrative   Lives alone in a 2 story home.  Has 3 children.  On disability.  Did work for [employer] for 29 years.  Education: some college.       Right handed    Social Determinants of Health   Financial Resource Strain: Medium Risk (12/24/2020)   Overall Financial Resource Strain (CARDIA)    Difficulty of Paying Living Expenses: Somewhat hard  Food Insecurity: Food Insecurity Present (12/24/2020)   Hunger Vital Sign    Worried About Running Out of Food in the Last Year: Often true    Ran Out of Food in the Last Year: Never true  Transportation Needs: No Transportation Needs (12/24/2020)   PRAPARE - Hydrologist (Medical): No    Lack of Transportation (Non-Medical): No  Physical Activity: Insufficiently Active (12/24/2020)   Exercise Vital Sign    Days of Exercise per Week: 5 days    Minutes of Exercise per Session: 20 min  Stress: No Stress Concern Present (12/24/2020)   [place]    Feeling of Stress : Only a little  Social Connections: Socially Isolated (12/24/2020)   Social Connection and Isolation Panel [NHANES]    Frequency of Communication with Friends and Family: More than three times a week    Frequency of Social Gatherings with Friends and Family: More than three times a week    Attends Religious Services: Never    Marine scientist or Organizations: No    Attends Archivist Meetings: Never    Marital Status: Divorced    Tobacco Counseling Counseling given: Not Answered Tobacco comments: quit 08/2003   Clinical Intake:                 Diabetic?yes Nutrition Risk Assessment:  Has the  patient had any N/V/D within the last 2 months?  {YES/NO:21197} Does  the patient have any non-healing wounds?  {YES/NO:21197} Has the patient had any unintentional weight loss or weight gain?  {YES/NO:21197}  Diabetes:  Is the patient diabetic?  {YES/NO:21197} If diabetic, was a CBG obtained today?  {YES/NO:21197} Did the patient bring in their glucometer from home?  {YES/NO:21197} How often do you monitor your CBG's? ***.   Financial Strains and Diabetes Management:  Are you having any financial strains with the device, your supplies or your medication? {YES/NO:21197}.  Does the patient want to be seen by Chronic Care Management for management of their diabetes?  {YES/NO:21197} Would the patient like to be referred to a Nutritionist or for Diabetic Management?  {YES/NO:21197}  Diabetic Exams:  Diabetic Eye Exam: Overdue for diabetic eye exam. Pt has been advised about the importance in completing this exam. Patient advised to call and schedule an eye exam. Diabetic Foot Exam: Completed 06/04/21           Activities of Daily Living     No data to display          Patient Care Team: Copland, Gay Filler, MD as PCP - General (Family Medicine) Newton Pigg, MD as Consulting Physician (Obstetrics and Gynecology) Biagio Quint, MD as Referring Physician (Nephrology) Alda Berthold, DO as Consulting Physician (Neurology)  Indicate any recent Medical Services you may have received from other than [facility] providers in the past year (date may be approximate).     Assessment:   This is a routine wellness examination for Viha.  Hearing/Vision screen No results found.  Dietary issues and exercise activities discussed:     Goals Addressed   None    Depression Screen    12/24/2020    1:06 PM 12/02/2020   11:28 AM 08/24/2019    3:00 PM 08/19/2018    2:15 PM 05/27/2016    2:23 PM 05/01/2015   11:10 AM 03/13/2015    9:35 AM  PHQ 2/9 Scores  PHQ - 2 Score 1 0 0 0 3 0 2  PHQ-  9 Score     11 0 8    Fall Risk    08/25/2021    1:48 PM 08/22/2021    1:34 PM 12/24/2020    1:05 PM 12/02/2020   11:28 AM 08/19/2020    2:37 PM  [place] in the past year? '1 1 1 1 1  '$ Number falls in past yr: 0 0 1 0 1  Injury with Fall? 1 1 0 0 0  Risk for fall due to : Impaired balance/gait;Impaired mobility  History of fall(s)    Follow up Falls evaluation completed;Falls prevention discussed  Falls prevention discussed      FALL RISK PREVENTION PERTAINING TO THE HOME:  Any stairs in or around the home? {YES/NO:21197} If so, are there any without handrails? {YES/NO:21197} Home free of loose throw rugs in walkways, pet beds, electrical cords, etc? {YES/NO:21197} Adequate lighting in your home to reduce risk of falls? {YES/NO:21197}  ASSISTIVE DEVICES UTILIZED TO PREVENT FALLS:  Life alert? {YES/NO:21197} Use of a cane, walker or w/c? {YES/NO:21197} Grab bars in the bathroom? {YES/NO:21197} Shower chair or bench in shower? {YES/NO:21197} Elevated toilet seat or a handicapped toilet? {YES/NO:21197}  TIMED UP AND GO:  Was the test performed? {YES/NO:21197}.  Length of time to ambulate 10 feet: *** sec.   {Appearance of XQJJ:9417408}  Cognitive Function:    08/19/2018    2:16 PM 05/27/2016    2:30 PM  MMSE - Mini Mental  State Exam  Orientation to time 5 5  Orientation to Place 5 5  Registration 3 3  Attention/ Calculation 5 4  Recall 1 2  Language- name 2 objects 2 2  Language- repeat 0 1  Language- follow 3 step command 3 3  Language- read & follow direction 1 1  Write a sentence 1 1  Copy design 1 1  Total score 27 28        Immunizations Immunization History  Administered Date(s) Administered   Influenza Split 05/13/2012   Influenza,inj,Quad PF,6+ Mos 05/30/2013, 04/05/2014, 03/13/2015, 02/19/2016, 02/16/2018, 03/06/2019, 06/04/2021   Influenza-Unspecified 04/08/2011   PFIZER(Purple Top)SARS-COV-2 Vaccination 09/18/2019, 10/11/2019, 05/24/2020    PNEUMOCOCCAL CONJUGATE-20 12/02/2020   Pneumococcal Polysaccharide-23 05/01/2015   Td 09/20/2006   Tdap 01/06/2017    TDAP status: Up to date  Flu Vaccine status: Up to date  Pneumococcal vaccine status: Up to date  {Covid-19 vaccine status:2101808}  Qualifies for Shingles Vaccine? {YES/NO:21197}  Zostavax completed {YES/NO:21197}  {Shingrix Completed?:2101804}  Screening Tests Health Maintenance  Topic Date Due   OPHTHALMOLOGY EXAM  03/19/2020   COVID-19 Vaccine (4 - Pfizer series) 07/19/2020   INFLUENZA VACCINE  01/20/2022   Zoster Vaccines- Shingrix (2 of 2) 02/08/2022   PAP SMEAR-Modifier  03/05/2022   FOOT EXAM  06/04/2022   HEMOGLOBIN A1C  06/05/2022   MAMMOGRAM  12/11/2022   COLONOSCOPY (Pts 45-61yr Insurance coverage will need to be confirmed)  02/06/2024   TETANUS/TDAP  01/07/2027   Pneumonia Vaccine 65 Years old  Completed   Hepatitis C Screening  Completed   HIV Screening  Completed   HPV VACCINES  Aged Out    Health Maintenance  Health Maintenance Due  Topic Date Due   OPHTHALMOLOGY EXAM  03/19/2020   COVID-19 Vaccine (4 - Pfizer series) 07/19/2020    Colorectal cancer screening: Type of screening: Colonoscopy. Completed 02/05/21. Repeat every 3 years  Mammogram status: Completed 621/22. Repeat every year every 2 years  Bone Density status: Completed 12/10/20. Results reflect: Bone density results: OSTEOPOROSIS. Repeat every 2 years.  Lung Cancer Screening: (Low Dose CT Chest recommended if Age 65-80years, 30 pack-year currently smoking OR have quit w/in 15years.) does not qualify.   Lung Cancer Screening Referral: n/a  Additional Screening:  Hepatitis C Screening: does qualify; Completed 03/13/15  Vision Screening: Recommended annual ophthalmology exams for early detection of glaucoma and other disorders of the eye. Is the patient up to date with their annual eye exam?  {YES/NO:21197} Who is the provider or what is the name of the office in  which the patient attends annual eye exams? *** If pt is not established with a provider, would they like to be referred to a provider to establish care? {YES/NO:21197}.   Dental Screening: Recommended annual dental exams for proper oral hygiene  Community Resource Referral / Chronic Care Management: CRR required this visit?  {YES/NO:21197}  CCM required this visit?  {YES/NO:21197}     Plan:     I have personally reviewed and noted the following in the patient's chart:   Medical and social history Use of alcohol, tobacco or illicit drugs  Current medications and supplements including opioid prescriptions.  Functional ability and status Nutritional status Physical activity Advanced directives List of other physicians Hospitalizations, surgeries, and ER visits in previous 12 months Vitals Screenings to include cognitive, depression, and falls Referrals and appointments  In addition, I have reviewed and discussed with patient certain preventive protocols, quality metrics, and best practice recommendations. A  written personalized care plan for preventive services as well as general preventive health recommendations were provided to patient.     Duard Brady Jaicob Dia, CMA   12/29/2021   Nurse Notes: ***

## 2021-12-30 ENCOUNTER — Ambulatory Visit (INDEPENDENT_AMBULATORY_CARE_PROVIDER_SITE_OTHER): Payer: Medicare HMO

## 2021-12-30 DIAGNOSIS — Z5941 Food insecurity: Secondary | ICD-10-CM

## 2021-12-30 DIAGNOSIS — Z0000 Encounter for general adult medical examination without abnormal findings: Secondary | ICD-10-CM | POA: Diagnosis not present

## 2021-12-30 NOTE — Patient Instructions (Signed)
Caroline Simmons , Thank you for taking time to come for your Medicare Wellness Visit. I appreciate your ongoing commitment to your health goals. Please review the following plan we discussed and let me know if I can assist you in the future.   Screening recommendations/referrals: Colonoscopy: 02/05/21 due 02/06/24 Mammogram: 12/10/20 due 12/11/22 Bone Density: 12/10/20 due 12/11/22 Recommended yearly ophthalmology/optometry visit for glaucoma screening and checkup Recommended yearly dental visit for hygiene and checkup  Vaccinations: Influenza vaccine: up to date Pneumococcal vaccine: up to date Tdap vaccine: up to date Shingles vaccine: Due-May obtain vaccine at your local pharmacy.    Covid-19:Due-May obtain vaccine at your local pharmacy.   Advanced directives: yes, living will  Conditions/risks identified: see problem list   Next appointment: Follow up in one year for your annual wellness visit 01/01/23   Preventive Care 65 Years and Older, Female Preventive care refers to lifestyle choices and visits with your health care provider that can promote health and wellness. What does preventive care include? A yearly physical exam. This is also called an annual well check. Dental exams once or twice a year. Routine eye exams. Ask your health care provider how often you should have your eyes checked. Personal lifestyle choices, including: Daily care of your teeth and gums. Regular physical activity. Eating a healthy diet. Avoiding tobacco and drug use. Limiting alcohol use. Practicing safe sex. Taking low-dose aspirin every day. Taking vitamin and mineral supplements as recommended by your health care provider. What happens during an annual well check? The services and screenings done by your health care provider during your annual well check will depend on your age, overall health, lifestyle risk factors, and family history of disease. Counseling  Your health care provider may ask you  questions about your: Alcohol use. Tobacco use. Drug use. Emotional well-being. Home and relationship well-being. Sexual activity. Eating habits. History of falls. Memory and ability to understand (cognition). Work and work Statistician. Reproductive health. Screening  You may have the following tests or measurements: Height, weight, and BMI. Blood pressure. Lipid and cholesterol levels. These may be checked every 5 years, or more frequently if you are over 16 years old. Skin check. Lung cancer screening. You may have this screening every year starting at age 59 if you have a 30-pack-year history of smoking and currently smoke or have quit within the past 15 years. Fecal occult blood test (FOBT) of the stool. You may have this test every year starting at age 47. Flexible sigmoidoscopy or colonoscopy. You may have a sigmoidoscopy every 5 years or a colonoscopy every 10 years starting at age 76. Hepatitis C blood test. Hepatitis B blood test. Sexually transmitted disease (STD) testing. Diabetes screening. This is done by checking your blood sugar (glucose) after you have not eaten for a while (fasting). You may have this done every 1-3 years. Bone density scan. This is done to screen for osteoporosis. You may have this done starting at age 65. Mammogram. This may be done every 1-2 years. Talk to your health care provider about how often you should have regular mammograms. Talk with your health care provider about your test results, treatment options, and if necessary, the need for more tests. Vaccines  Your health care provider may recommend certain vaccines, such as: Influenza vaccine. This is recommended every year. Tetanus, diphtheria, and acellular pertussis (Tdap, Td) vaccine. You may need a Td booster every 10 years. Zoster vaccine. You may need this after age 60. Pneumococcal 13-valent conjugate (PCV13)  vaccine. One dose is recommended after age 43. Pneumococcal polysaccharide  (PPSV23) vaccine. One dose is recommended after age 73. Talk to your health care provider about which screenings and vaccines you need and how often you need them. This information is not intended to replace advice given to you by your health care provider. Make sure you discuss any questions you have with your health care provider. Document Released: 07/05/2015 Document Revised: 02/26/2016 Document Reviewed: 04/09/2015 Elsevier Interactive Patient Education  2017 [place] Prevention in the Home Falls can cause injuries. They can happen to people of all ages. There are many things you can do to make your home safe and to help prevent falls. What can I do on the outside of my home? Regularly fix the edges of walkways and driveways and fix any cracks. Remove anything that might make you trip as you walk through a door, such as a raised step or threshold. Trim any bushes or trees on the path to your home. Use bright outdoor lighting. Clear any walking paths of anything that might make someone trip, such as rocks or tools. Regularly check to see if handrails are loose or broken. Make sure that both sides of any steps have handrails. Any raised decks and porches should have guardrails on the edges. Have any leaves, snow, or ice cleared regularly. Use sand or salt on walking paths during winter. Clean up any spills in your garage right away. This includes oil or grease spills. What can I do in the bathroom? Use night lights. Install grab bars by the toilet and in the tub and shower. Do not use towel bars as grab bars. Use non-skid mats or decals in the tub or shower. If you need to sit down in the shower, use a plastic, non-slip stool. Keep the floor dry. Clean up any water that spills on the floor as soon as it happens. Remove soap buildup in the tub or shower regularly. Attach bath mats securely with double-sided non-slip rug tape. Do not have throw rugs and other things on the  floor that can make you trip. What can I do in the bedroom? Use night lights. Make sure that you have a light by your bed that is easy to reach. Do not use any sheets or blankets that are too big for your bed. They should not hang down onto the floor. Have a firm chair that has side arms. You can use this for support while you get dressed. Do not have throw rugs and other things on the floor that can make you trip. What can I do in the kitchen? Clean up any spills right away. Avoid walking on wet floors. Keep items that you use a lot in easy-to-reach places. If you need to reach something above you, use a strong step stool that has a grab bar. Keep electrical cords out of the way. Do not use floor polish or wax that makes floors slippery. If you must use wax, use non-skid floor wax. Do not have throw rugs and other things on the floor that can make you trip. What can I do with my stairs? Do not leave any items on the stairs. Make sure that there are handrails on both sides of the stairs and use them. Fix handrails that are broken or loose. Make sure that handrails are as long as the stairways. Check any carpeting to make sure that it is firmly attached to the stairs. Fix any carpet that is loose  or worn. Avoid having throw rugs at the top or bottom of the stairs. If you do have throw rugs, attach them to the floor with carpet tape. Make sure that you have a light switch at the top of the stairs and the bottom of the stairs. If you do not have them, ask someone to add them for you. What else can I do to help prevent falls? Wear shoes that: Do not have high heels. Have rubber bottoms. Are comfortable and fit you well. Are closed at the toe. Do not wear sandals. If you use a stepladder: Make sure that it is fully opened. Do not climb a closed stepladder. Make sure that both sides of the stepladder are locked into place. Ask someone to hold it for you, if possible. Clearly mark and make  sure that you can see: Any grab bars or handrails. First and last steps. Where the edge of each step is. Use tools that help you move around (mobility aids) if they are needed. These include: Canes. Walkers. Scooters. Crutches. Turn on the lights when you go into a dark area. Replace any light bulbs as soon as they burn out. Set up your furniture so you have a clear path. Avoid moving your furniture around. If any of your floors are uneven, fix them. If there are any pets around you, be aware of where they are. Review your medicines with your doctor. Some medicines can make you feel dizzy. This can increase your chance of falling. Ask your doctor what other things that you can do to help prevent falls. This information is not intended to replace advice given to you by your health care provider. Make sure you discuss any questions you have with your health care provider. Document Released: 04/04/2009 Document Revised: 11/14/2015 Document Reviewed: 07/13/2014 Elsevier Interactive Patient Education  2017 [employer].

## 2022-01-02 ENCOUNTER — Telehealth: Payer: Self-pay

## 2022-01-02 NOTE — Telephone Encounter (Signed)
   Telephone encounter was:  Successful.  01/02/2022 Name: Caroline Simmons MRN: [id number] DOB: 1955/08/24  Caroline Simmons is a 66 y.o. year old female who is a primary care patient of Copland, Gay Filler, MD . The community resource team was consulted for assistance with [place] guide performed the following interventions: Patient provided with information about care guide support team and interviewed to confirm resource needs.Patient wanted me to call her back after 12:00 because she was sleeping  Follow Up Plan:  Care guide will follow up with patient by phone over the next few hours    [place], Care Management  [phone] [address], [place], [place] [address] Phone: [phone] Email: [email or web address]

## 2022-01-02 NOTE — Telephone Encounter (Signed)
Mailed resources to the patient    [place], Care Management  [phone] [address], [place], [place] [address] Phone: [phone] Email: [email or web address]

## 2022-01-19 ENCOUNTER — Telehealth: Payer: Self-pay

## 2022-01-19 NOTE — Telephone Encounter (Signed)
   Telephone encounter was:  Unsuccessful.  01/19/2022 Name: Caroline Simmons MRN: [id number] DOB: [birth year]  Unsuccessful outbound call made today to assist with:  Food Insecurity  Outreach Attempt:  2nd Attempt  A HIPAA compliant voice message was left requesting a return call.  Instructed patient to call back at  earliest convenience.   [place], Care Management  [phone] [address], [place], [place] [address] Phone: [phone] Email: [email or web address]

## 2022-01-21 ENCOUNTER — Telehealth: Payer: Self-pay

## 2022-01-21 NOTE — Telephone Encounter (Signed)
   Telephone encounter was:  Unsuccessful.  01/21/2022 Name: Caroline Simmons MRN: [id number] DOB: 24-Jun-1955  Unsuccessful outbound call made today to assist with:  Food Insecurity  Outreach Attempt:  3rd Attempt.  Referral closed unable to contact patient.  A HIPAA compliant voice message was left requesting a return call.  Instructed patient to call back at earliest convenience. [place], Care Management  [phone] [address], [place], [place] [address] Phone: [phone] Email: [email or web address]

## 2022-01-22 ENCOUNTER — Ambulatory Visit (INDEPENDENT_AMBULATORY_CARE_PROVIDER_SITE_OTHER): Payer: Medicare HMO | Admitting: Family Medicine

## 2022-01-22 ENCOUNTER — Encounter: Payer: Self-pay | Admitting: Family Medicine

## 2022-01-22 VITALS — BP 118/80 | Ht 63.0 in | Wt 167.0 lb

## 2022-01-22 DIAGNOSIS — M659 Synovitis and tenosynovitis, unspecified: Secondary | ICD-10-CM | POA: Diagnosis not present

## 2022-01-22 MED ORDER — COLCHICINE 0.6 MG PO TABS: 0.6000 mg | ORAL_TABLET | Freq: Two times a day (BID) | ORAL | 2 refills | Status: DC

## 2022-01-22 NOTE — Progress Notes (Signed)
  Caroline Simmons - 66 y.o. female MRN [id number]  Date of birth: [birth year]  SUBJECTIVE:  Including CC & ROS.  No chief complaint on file.   Caroline Simmons is a 66 y.o. female that is presenting with acute worsening of her right foot pain.  She got significant improvement with the injection but pain has slowly started to return.  Most recent metabolic panel was showing worsening of her kidney function which may be contributing to her ongoing symptoms.    Review of Systems See HPI   HISTORY: Past Medical, Surgical, Social, and Family History Reviewed & Updated per EMR.   Pertinent Historical Findings include:  Past Medical History:  Diagnosis Date   Alopecia    Anxiety    on meds   Arthritis    back   Cataract 2011   bilateral removal   Chronic kidney disease (CKD), stage III (moderate) (HCC)    Depression    on meds   Essential hypertension, benign    on meds   GERD (gastroesophageal reflux disease)    on meds   Hyperlipidemia    on meds   Hypothyroid    on meds   Insomnia, unspecified    Menopause    lmp 06/2007   Neuropathy    per pt   Type II or unspecified type diabetes mellitus without mention of complication, not stated as uncontrolled    on meds   Vitamin D deficiency     Past Surgical History:  Procedure Laterality Date   CATARACT EXTRACTION, BILATERAL     CESAREAN SECTION     1982/1991   COLONOSCOPY  2012   [facility]-F/V-movi(exc)-HPP   POLYPECTOMY  2012   HPP   RETINAL DETACHMENT REPAIR W/ SCLERAL BUCKLE LE  11/12/2011   WISDOM TOOTH EXTRACTION       PHYSICAL EXAM:  VS: BP 118/80 (BP Location: Left Arm, Patient Position: Sitting)   Ht '5\' 3"'$  (1.6 m)   Wt 167 lb (75.8 kg)   BMI 29.58 kg/m  Physical Exam Gen: NAD, alert, cooperative with exam, well-appearing MSK:  Neurovascularly intact       ASSESSMENT & PLAN:   Peroneal tenosynovitis Acutely worsening.  Having effusion on the lateral aspect.  She did receive significant improvement  with the steroid injection.  There seems to be an inflammatory component.  This may be a result of her worsening kidney function. -Counseled on home exercise therapy and supportive care. -Colchicine. -Uric acid, ANA, sed rate and CRP.

## 2022-01-22 NOTE — Patient Instructions (Signed)
Good to see you Please try the medicine. Please message me after 5 days to see if it's helping or not.  I will call with the lab results.   Please send me a message in MyChart with any questions or updates.  Please see me back in 4-8 weeks.   --Dr. Raeford Razor

## 2022-01-22 NOTE — Assessment & Plan Note (Signed)
Acutely worsening.  Having effusion on the lateral aspect.  She did receive significant improvement with the steroid injection.  There seems to be an inflammatory component.  This may be a result of her worsening kidney function. -Counseled on home exercise therapy and supportive care. -Colchicine. -Uric acid, ANA, sed rate and CRP.

## 2022-01-23 ENCOUNTER — Other Ambulatory Visit: Payer: Self-pay | Admitting: Family Medicine

## 2022-01-23 DIAGNOSIS — M659 Synovitis and tenosynovitis, unspecified: Secondary | ICD-10-CM | POA: Diagnosis not present

## 2022-01-28 LAB — ANA,IFA RA DIAG PNL W/RFLX TIT/PATN
ANA Titer 1: NEGATIVE
Cyclic Citrullin Peptide Ab: 5 units (ref 0–19)
Rheumatoid fact SerPl-aCnc: <10 IU/mL (ref <14.0)

## 2022-01-28 LAB — URIC ACID: Uric Acid: 7.7 mg/dL — ABNORMAL HIGH (ref 3.0–7.2)

## 2022-01-28 LAB — C-REACTIVE PROTEIN: CRP: 3 mg/L (ref 0–10)

## 2022-01-28 LAB — SEDIMENTATION RATE: Sed Rate: 17 mm/hr (ref 0–40)

## 2022-01-29 ENCOUNTER — Telehealth: Payer: Self-pay | Admitting: *Deleted

## 2022-01-29 NOTE — Telephone Encounter (Signed)
-----   Message from Elberta Spaniel sent at 01/28/2022  3:10 PM EDT ----- Regarding: Rx working only a little to reduce pain Patient called states : colchicine 0.6 MG tablet [[id number]]   Order Details Dose: 0.6 mg Route: Oral Frequency: 2 times daily Dispense Quantity: 60 tablet Refills: 2     Sig: Take 1 tablet (0.6 mg total) by mouth 2 (two) times daily.  ----Barely working to resolve pain but she will continue to  take--- Just an [place]

## 2022-02-02 ENCOUNTER — Telehealth: Payer: Self-pay | Admitting: Family Medicine

## 2022-02-02 MED ORDER — ALLOPURINOL 100 MG PO TABS: 100.0000 mg | ORAL_TABLET | Freq: Every day | ORAL | 3 refills | Status: DC

## 2022-02-02 MED ORDER — PREDNISONE 20 MG PO TABS: ORAL_TABLET | ORAL | 0 refills | Status: DC

## 2022-02-02 NOTE — Telephone Encounter (Signed)
Informed of results. Stop colchicine and HCTZ. It appears that her symptoms are gout related with her worsening kidney function. Start allopurinol and prednisone.   Rosemarie Ax, MD [facility] 02/02/2022, 1:20 PM

## 2022-02-12 ENCOUNTER — Ambulatory Visit: Payer: Medicare HMO | Admitting: Family Medicine

## 2022-02-13 ENCOUNTER — Other Ambulatory Visit: Payer: Self-pay | Admitting: Neurology

## 2022-02-19 ENCOUNTER — Other Ambulatory Visit: Payer: Self-pay | Admitting: Neurology

## 2022-03-11 DIAGNOSIS — E119 Type 2 diabetes mellitus without complications: Secondary | ICD-10-CM | POA: Diagnosis not present

## 2022-03-11 LAB — HM DIABETES EYE EXAM

## 2022-03-23 ENCOUNTER — Ambulatory Visit: Payer: Medicare HMO | Admitting: Family Medicine

## 2022-03-23 ENCOUNTER — Encounter: Payer: Self-pay | Admitting: Family Medicine

## 2022-03-23 ENCOUNTER — Telehealth: Payer: Self-pay | Admitting: Family Medicine

## 2022-03-23 VITALS — BP 110/72 | Ht 63.0 in | Wt 167.0 lb

## 2022-03-23 DIAGNOSIS — M659 Synovitis and tenosynovitis, unspecified: Secondary | ICD-10-CM | POA: Diagnosis not present

## 2022-03-23 DIAGNOSIS — M10371 Gout due to renal impairment, right ankle and foot: Secondary | ICD-10-CM | POA: Diagnosis not present

## 2022-03-23 DIAGNOSIS — I10 Essential (primary) hypertension: Secondary | ICD-10-CM | POA: Diagnosis not present

## 2022-03-23 MED ORDER — ALLOPURINOL 100 MG PO TABS
150.0000 mg | ORAL_TABLET | Freq: Every day | ORAL | 3 refills | Status: DC
Start: 2022-03-23 — End: 2022-05-04

## 2022-03-23 MED ORDER — PREDNISONE 10 MG PO TABS: 10.0000 mg | ORAL_TABLET | Freq: Every day | ORAL | 0 refills | Status: DC

## 2022-03-23 NOTE — Assessment & Plan Note (Signed)
It appears that her renal function is associated with her elevated uric acid.  This is a recurrent issue for her. -Increasing allopurinol.

## 2022-03-23 NOTE — Progress Notes (Signed)
  Caroline Simmons - 66 y.o. female MRN [id number]  Date of birth: 1956-03-20  SUBJECTIVE:  Including CC & ROS.  No chief complaint on file.   Caroline Simmons is a 66 y.o. female that is presenting with acute on chronic right lateral ankle pain.  No inciting event.  She has done well with the previous steroid injection.  Symptoms of acutely occurring.  Having pain with all movements.   Review of Systems See HPI   HISTORY: Past Medical, Surgical, Social, and Family History Reviewed & Updated per EMR.   Pertinent Historical Findings include:  Past Medical History:  Diagnosis Date   Alopecia    Anxiety    on meds   Arthritis    back   Cataract 2011   bilateral removal   Chronic kidney disease (CKD), stage III (moderate) (HCC)    Depression    on meds   Essential hypertension, benign    on meds   GERD (gastroesophageal reflux disease)    on meds   Hyperlipidemia    on meds   Hypothyroid    on meds   Insomnia, unspecified    Menopause    lmp 06/2007   Neuropathy    per pt   Type II or unspecified type diabetes mellitus without mention of complication, not stated as uncontrolled    on meds   Vitamin D deficiency     Past Surgical History:  Procedure Laterality Date   CATARACT EXTRACTION, BILATERAL     CESAREAN SECTION     1982/1991   COLONOSCOPY  2012   [facility]-F/V-movi(exc)-HPP   POLYPECTOMY  2012   HPP   RETINAL DETACHMENT REPAIR W/ SCLERAL BUCKLE LE  11/12/2011   WISDOM TOOTH EXTRACTION       PHYSICAL EXAM:  VS: BP 110/72 (BP Location: Left Arm, Patient Position: Sitting)   Ht '5\' 3"'$  (1.6 m)   Wt 167 lb (75.8 kg)   BMI 29.58 kg/m  Physical Exam Gen: NAD, alert, cooperative with exam, well-appearing MSK:  Neurovascularly intact       ASSESSMENT & PLAN:   Essential hypertension Stop your hydrochlorothiazide today.  Initially it was started for lower extremity swelling by nephrology.  Her amlodipine was discontinued at that time. -Stopping  hydrochlorothiazide as her recurrent pain seems to be associated with a gouty source. -Advised follow-up with PCP.  Peroneal tenosynovitis Acutely occurring on right lateral ankle.  Did get improvement with previous injection.  Does seem to be associated with an underlying inflammatory source such as gout. -Counseled on home exercise therapy and supportive care. -Prednisone.  Counseled on monitoring her sugars. -Could consider injection.  Acute gout due to renal impairment involving right ankle It appears that her renal function is associated with her elevated uric acid.  This is a recurrent issue for her. -Increasing allopurinol.

## 2022-03-23 NOTE — Telephone Encounter (Deleted)
Informed of results. Pursue Franco Nones, MD [facility] 03/23/2022, 3:44 PM

## 2022-03-23 NOTE — Patient Instructions (Addendum)
Good to see you Please try compression  Please try to elevate and ice as needed  Please try increasing the allopurinol   Please stop the hydrochlorothiazide as this may be exacerbating some your symptoms. Please follow-up with your primary care about adjusting your blood pressure control. Please send me a message in MyChart with any questions or updates.  Please see me back in 4 weeks.   --Dr. Raeford Razor

## 2022-03-23 NOTE — Assessment & Plan Note (Signed)
Stop your hydrochlorothiazide today.  Initially it was started for lower extremity swelling by nephrology.  Her amlodipine was discontinued at that time. -Stopping hydrochlorothiazide as her recurrent pain seems to be associated with a gouty source. -Advised follow-up with PCP.

## 2022-03-23 NOTE — Telephone Encounter (Signed)
error 

## 2022-03-23 NOTE — Assessment & Plan Note (Signed)
Acutely occurring on right lateral ankle.  Did get improvement with previous injection.  Does seem to be associated with an underlying inflammatory source such as gout. -Counseled on home exercise therapy and supportive care. -Prednisone.  Counseled on monitoring her sugars. -Could consider injection.

## 2022-04-13 DIAGNOSIS — N1832 Chronic kidney disease, stage 3b: Secondary | ICD-10-CM | POA: Diagnosis not present

## 2022-04-15 DIAGNOSIS — Z87891 Personal history of nicotine dependence: Secondary | ICD-10-CM | POA: Diagnosis not present

## 2022-04-15 DIAGNOSIS — E1122 Type 2 diabetes mellitus with diabetic chronic kidney disease: Secondary | ICD-10-CM | POA: Diagnosis not present

## 2022-04-15 DIAGNOSIS — I129 Hypertensive chronic kidney disease with stage 1 through stage 4 chronic kidney disease, or unspecified chronic kidney disease: Secondary | ICD-10-CM | POA: Diagnosis not present

## 2022-04-15 DIAGNOSIS — N1832 Chronic kidney disease, stage 3b: Secondary | ICD-10-CM | POA: Diagnosis not present

## 2022-04-15 DIAGNOSIS — Z1331 Encounter for screening for depression: Secondary | ICD-10-CM | POA: Diagnosis not present

## 2022-04-15 DIAGNOSIS — E872 Acidosis, unspecified: Secondary | ICD-10-CM | POA: Diagnosis not present

## 2022-04-15 DIAGNOSIS — R809 Proteinuria, unspecified: Secondary | ICD-10-CM | POA: Diagnosis not present

## 2022-04-15 DIAGNOSIS — E1140 Type 2 diabetes mellitus with diabetic neuropathy, unspecified: Secondary | ICD-10-CM | POA: Diagnosis not present

## 2022-04-15 DIAGNOSIS — Z794 Long term (current) use of insulin: Secondary | ICD-10-CM | POA: Diagnosis not present

## 2022-04-30 ENCOUNTER — Ambulatory Visit: Payer: Medicare HMO | Admitting: Family Medicine

## 2022-04-30 VITALS — BP 118/70 | Ht 63.0 in | Wt 167.0 lb

## 2022-04-30 DIAGNOSIS — M10371 Gout due to renal impairment, right ankle and foot: Secondary | ICD-10-CM | POA: Diagnosis not present

## 2022-04-30 DIAGNOSIS — M7670 Peroneal tendinitis, unspecified leg: Secondary | ICD-10-CM

## 2022-04-30 DIAGNOSIS — M659 Synovitis and tenosynovitis, unspecified: Secondary | ICD-10-CM

## 2022-04-30 DIAGNOSIS — N183 Chronic kidney disease, stage 3 unspecified: Secondary | ICD-10-CM

## 2022-04-30 NOTE — Progress Notes (Signed)
  Caroline Simmons - 66 y.o. female MRN [id number]  Date of birth: 03/26/1956  SUBJECTIVE:  Including CC & ROS.  No chief complaint on file.   Caroline Simmons is a 66 y.o. female that is  following up for her right foot pain. Has been doing well with the recent medication adjustments. No significant pain today.   Review of Systems See HPI   HISTORY: Past Medical, Surgical, Social, and Family History Reviewed & Updated per EMR.   Pertinent Historical Findings include:  Past Medical History:  Diagnosis Date   Alopecia    Anxiety    on meds   Arthritis    back   Cataract 2011   bilateral removal   Chronic kidney disease (CKD), stage III (moderate) (HCC)    Depression    on meds   Essential hypertension, benign    on meds   GERD (gastroesophageal reflux disease)    on meds   Hyperlipidemia    on meds   Hypothyroid    on meds   Insomnia, unspecified    Menopause    lmp 06/2007   Neuropathy    per pt   Type II or unspecified type diabetes mellitus without mention of complication, not stated as uncontrolled    on meds   Vitamin D deficiency     Past Surgical History:  Procedure Laterality Date   CATARACT EXTRACTION, BILATERAL     CESAREAN SECTION     1982/1991   COLONOSCOPY  2012   [facility]-F/V-movi(exc)-HPP   POLYPECTOMY  2012   HPP   RETINAL DETACHMENT REPAIR W/ SCLERAL BUCKLE LE  11/12/2011   WISDOM TOOTH EXTRACTION       PHYSICAL EXAM:  VS: BP 118/70   Ht '5\' 3"'$  (1.6 m)   Wt 167 lb (75.8 kg)   BMI 29.58 kg/m  Physical Exam Gen: NAD, alert, cooperative with exam, well-appearing MSK:  Neurovascularly intact      ASSESSMENT & PLAN:   Acute gout due to renal impairment involving right ankle Doing well with the change in the allopurinol. Try to limit anti-inflammatories and prednisone given kidney function and A1c - counseled on home exercise therapy and supportive care - continue allopurinol   Peroneal tenosynovitis Doing well with the insoles  -  counseled on home exercise therapy and supportive care - could consider physical therapy

## 2022-04-30 NOTE — Assessment & Plan Note (Signed)
Doing well with the insoles  - counseled on home exercise therapy and supportive care - could consider physical therapy

## 2022-04-30 NOTE — Assessment & Plan Note (Signed)
Doing well with the change in the allopurinol. Try to limit anti-inflammatories and prednisone given kidney function and A1c - counseled on home exercise therapy and supportive care - continue allopurinol

## 2022-05-02 ENCOUNTER — Other Ambulatory Visit: Payer: Self-pay | Admitting: Family Medicine

## 2022-06-01 NOTE — Progress Notes (Addendum)
[name] at [name], [name], [name] [address] [phone]  Date:  06/04/2022   Name:  Caroline Simmons   DOB:  [birth year]   MRN:  [id number]  PCP:  Darreld Mclean, MD    Chief Complaint: 6 month follow up (Concerns/ questions: none/Flu shot today: yes/Urine MA due/Foot exam due)   History of Present Illness:  Caroline Simmons is a 66 y.o. very pleasant female patient who presents with the following:  Pt seen today for a periodic recheck Last seen by myself in June - History of diet controlled diabetes with neuropathy, hypertension, hyperlipidemia, chronic kidney disease, hypothyroidism   She is seen by nephrology for her CKD- most recent visit in May of this year  Pt thinks she was actually seen in October as well   Flu shot today   Lab Results  Component Value Date   HGBA1C 7.4 (H) 12/04/2021   Pt notes she is bothered by gout in her right ankle She has been seen by sports med and had x-rays  She is taking allopurinol 100 mg daily   Patient Active Problem List   Diagnosis Date Noted   Acute gout due to renal impairment involving right ankle 03/23/2022   Peroneal tenosynovitis 12/17/2021   Arthrosis of left midfoot 10/27/2021   Stress fracture of calcaneus 09/02/2021   Pes cavus of both feet 09/02/2021   Abnormal urinalysis 09/14/2019   Vitamin D deficiency 08/10/2017   Secondary hyperparathyroidism of renal origin ([place]) 07/30/2016   Chronic kidney disease, stage 3 ([place]) 04/08/2016   Dyslipidemia 04/02/2016   Proteinuria 04/02/2016   Fatigue 12/25/2013   Neuropathy 12/25/2013   Dyslipidemia associated with type 2 diabetes mellitus ([place]) 05/30/2013   Detached retina, left 11/25/2011   Depression with anxiety 11/25/2011   Neuropathy in diabetes ([place]) 09/15/2011   Insomnia, unspecified    Hypothyroidism    DIABETES MELLITUS, TYPE II 08/26/2010   Essential hypertension 08/26/2010   RECTAL PAIN  08/26/2010   Peripheral motor neuropathy 06/22/2009    Past Medical History:  Diagnosis Date   Alopecia    Anxiety    on meds   Arthritis    back   Cataract 2011   bilateral removal   Chronic kidney disease (CKD), stage III (moderate) ([place])    Depression    on meds   Essential hypertension, benign    on meds   GERD (gastroesophageal reflux disease)    on meds   Hyperlipidemia    on meds   Hypothyroid    on meds   Insomnia, unspecified    Menopause    lmp 06/2007   Neuropathy    per pt   Type II or unspecified type diabetes mellitus without mention of complication, not stated as uncontrolled    on meds   Vitamin D deficiency     Past Surgical History:  Procedure Laterality Date   CATARACT EXTRACTION, BILATERAL     CESAREAN SECTION     1982/1991   COLONOSCOPY  2012   [place]-F/V-movi(exc)-HPP   POLYPECTOMY  2012   HPP   RETINAL DETACHMENT REPAIR W/ SCLERAL BUCKLE LE  11/12/2011   WISDOM TOOTH EXTRACTION      Social History   Tobacco Use   Smoking status: Former    Packs/day: 1.00    Years: 3.00    Total pack years: 3.00    Types: Cigarettes   Smokeless tobacco: Never   Tobacco  comments:    quit 08/2003  Vaping Use   Vaping Use: Never used  Substance Use Topics   Alcohol use: Not Currently   Drug use: No    Family History  Problem Relation Age of Onset   Diabetes Mother        type 2  not sure onset   Diabetes Other    Hypertension Other    Colon cancer Neg Hx    Colon polyps Neg Hx    Esophageal cancer Neg Hx    Stomach cancer Neg Hx    Rectal cancer Neg Hx     Allergies  Allergen Reactions   Codeine Nausea Only    Pt states she has taken this with no problems.   Zoloft  [Sertraline Hcl]    Lamotrigine Rash    Other reaction(s): RASH    Medication list has been reviewed and updated.  Current Outpatient Medications on File Prior to Visit  Medication Sig Dispense Refill   Alcohol Swabs (B-D SINGLE USE SWABS REGULAR) PADS USE  1   SWAB EVERY DAY 100 each 3   allopurinol (ZYLOPRIM) 100 MG tablet TAKE 1 TABLET BY MOUTH EVERY DAY 90 tablet 1   amitriptyline (ELAVIL) 100 MG tablet TAKE 1 TABLET BY MOUTH EVERYDAY AT BEDTIME 90 tablet 3   amitriptyline (ELAVIL) 25 MG tablet Take 1 tablet (25 mg total) by mouth daily. 90 tablet 3   aspirin 81 MG tablet Take 81 mg by mouth daily.     carbamazepine (TEGRETOL) 200 MG tablet Take 1 tablet (200 mg total) by mouth in the morning and at bedtime. 180 tablet 3   colchicine 0.6 MG tablet Take 1 tablet (0.6 mg total) by mouth 2 (two) times daily. 60 tablet 2   finasteride (PROSCAR) 5 MG tablet      HYDROcodone-acetaminophen (NORCO/VICODIN) 5-325 MG tablet Take 1 tablet by mouth every 8 (eight) hours as needed. 8 tablet 0   omeprazole (PRILOSEC) 20 MG capsule TAKE 1- 2 CAPSULES ONCE A DAY FOR NAUSEA AND REFLUX. USE AS NEEDED- DOES NOT HAVE TO TAKE EVERY DAY 180 capsule 1   predniSONE (DELTASONE) 10 MG tablet Take 1 tablet (10 mg total) by mouth daily with breakfast. 6 tablet 0   rosuvastatin (CRESTOR) 20 MG tablet TAKE 1 TABLET EVERY DAY 90 tablet 1   sodium bicarbonate 650 MG tablet Take 650 mg by mouth 2 (two) times daily.     No current facility-administered medications on file prior to visit.    Review of Systems:  As per HPI- otherwise negative.   Physical Examination: Vitals:   06/04/22 1553  BP: 118/80  Pulse: 80  Resp: 18  Temp: 97.7 F (36.5 C)  SpO2: 99%   Vitals:   06/04/22 1553  Weight: 166 lb 12.8 oz (75.7 kg)  Height: '5\' 3"'$  (1.6 m)   Body mass index is 29.55 kg/m. Ideal Body Weight: Weight in (lb) to have BMI = 25: 140.8  GEN: no acute distress. Overweight, looks well  HEENT: Atraumatic, Normocephalic.  Ears and Nose: No external deformity. CV: RRR, No M/G/R. No JVD. No thrill. No extra heart sounds. PULM: CTA B, no wheezes, crackles, rhonchi. No retractions. No resp. distress. No accessory muscle use. ABD: S, NT, ND, +BS. No rebound. No HSM. EXTR: No  c/c/e PSYCH: Normally interactive. Conversant.  Right ankle- mild effusion of the right lateral ankle, pt states this is chronic    Assessment and Plan: Dyslipidemia associated with type 2 diabetes  mellitus ([place]) - Plan: Lipid panel  Hypothyroidism due to acquired atrophy of thyroid - Plan: levothyroxine (SYNTHROID) 50 MCG tablet, TSH  Essential hypertension - Plan: lisinopril (ZESTRIL) 20 MG tablet  Diabetic mononeuropathy associated with type 2 diabetes mellitus ([employer]) - Plan: glucose blood (TRUE METRIX PRO BLOOD GLUCOSE) test strip, Comprehensive metabolic panel, Hemoglobin A1c  Idiopathic chronic gout of right ankle without tophus - Plan: CBC, Uric acid  Stage 3 chronic kidney disease, unspecified whether stage 3a or 3b CKD ([facility])  Need for influenza vaccination - Plan: Flu Vaccine QUAD High Dose(Fluad)  Labs are pending as above Flu shot Refill thyroid and BP meds May need to adjust her allopurinol if uric acid is elevated   Signed Lamar Blinks, MD  Received her labs-letter to pat and she does not have mychart   Results for orders placed or performed in visit on 06/04/22  CBC  Result Value Ref Range   WBC 7.7 4.0 - 10.5 K/uL   RBC 4.12 3.87 - 5.11 Mil/uL   Platelets 216.0 150.0 - 400.0 K/uL   Hemoglobin 13.1 12.0 - 15.0 g/dL   HCT 38.3 36.0 - 46.0 %   MCV 92.9 78.0 - 100.0 fl   MCHC 34.1 30.0 - 36.0 g/dL   RDW 13.1 11.5 - 15.5 %  Comprehensive metabolic panel  Result Value Ref Range   Sodium 140 135 - 145 mEq/L   Potassium 4.6 3.5 - 5.1 mEq/L   Chloride 104 96 - 112 mEq/L   CO2 26 19 - 32 mEq/L   Glucose, Bld 108 (H) 70 - 99 mg/dL   BUN 17 6 - 23 mg/dL   Creatinine, Ser 1.51 (H) 0.40 - 1.20 mg/dL   Total Bilirubin 0.2 0.2 - 1.2 mg/dL   Alkaline Phosphatase 69 39 - 117 U/L   AST 21 0 - 37 U/L   ALT 19 0 - 35 U/L   Total Protein 7.1 6.0 - 8.3 g/dL   Albumin 4.5 3.5 - 5.2 g/dL   GFR 35.99 (L) >60.00 mL/min   Calcium 9.6 8.4 - 10.5 mg/dL  Hemoglobin A1c   Result Value Ref Range   Hgb A1c MFr Bld 7.4 (H) 4.6 - 6.5 %  Lipid panel  Result Value Ref Range   Cholesterol 165 0 - 200 mg/dL   Triglycerides 188.0 (H) 0.0 - 149.0 mg/dL   HDL 60.50 >39.00 mg/dL   VLDL 37.6 0.0 - 40.0 mg/dL   LDL Cholesterol 67 0 - 99 mg/dL   Total CHOL/HDL Ratio 3    NonHDL 104.73   TSH  Result Value Ref Range   TSH 0.99 0.35 - 5.50 uIU/mL  Uric acid  Result Value Ref Range   Uric Acid, Serum 3.9 2.4 - 7.0 mg/dL

## 2022-06-04 ENCOUNTER — Ambulatory Visit (INDEPENDENT_AMBULATORY_CARE_PROVIDER_SITE_OTHER): Payer: Medicare HMO | Admitting: Family Medicine

## 2022-06-04 ENCOUNTER — Ambulatory Visit: Payer: Medicare HMO | Admitting: Family Medicine

## 2022-06-04 VITALS — BP 118/80 | HR 80 | Temp 97.7°F | Resp 18 | Ht 63.0 in | Wt 166.8 lb

## 2022-06-04 DIAGNOSIS — N183 Chronic kidney disease, stage 3 unspecified: Secondary | ICD-10-CM

## 2022-06-04 DIAGNOSIS — E785 Hyperlipidemia, unspecified: Secondary | ICD-10-CM

## 2022-06-04 DIAGNOSIS — M1A0710 Idiopathic chronic gout, right ankle and foot, without tophus (tophi): Secondary | ICD-10-CM | POA: Diagnosis not present

## 2022-06-04 DIAGNOSIS — E1141 Type 2 diabetes mellitus with diabetic mononeuropathy: Secondary | ICD-10-CM | POA: Diagnosis not present

## 2022-06-04 DIAGNOSIS — E034 Atrophy of thyroid (acquired): Secondary | ICD-10-CM | POA: Diagnosis not present

## 2022-06-04 DIAGNOSIS — I10 Essential (primary) hypertension: Secondary | ICD-10-CM | POA: Diagnosis not present

## 2022-06-04 DIAGNOSIS — Z23 Encounter for immunization: Secondary | ICD-10-CM

## 2022-06-04 DIAGNOSIS — E1169 Type 2 diabetes mellitus with other specified complication: Secondary | ICD-10-CM

## 2022-06-04 MED ORDER — LISINOPRIL 20 MG PO TABS: 20.0000 mg | ORAL_TABLET | Freq: Every day | ORAL | 3 refills | Status: DC

## 2022-06-04 MED ORDER — LEVOTHYROXINE SODIUM 50 MCG PO TABS: 50.0000 ug | ORAL_TABLET | Freq: Every day | ORAL | 3 refills | Status: DC

## 2022-06-04 MED ORDER — TRUE METRIX PRO BLOOD GLUCOSE VI STRP: ORAL_STRIP | 12 refills | Status: DC

## 2022-06-04 NOTE — Patient Instructions (Addendum)
Good to see you today- I will be in touch with your labs- I may need to adjust your allopurinol for better gout control Flu shot today  Recommend 2nd dose of shingrix if not done yet, covid booster Take care!  Assuming all is well we can follow-up in about 6 months

## 2022-06-05 LAB — URIC ACID: Uric Acid, Serum: 3.9 mg/dL (ref 2.4–7.0)

## 2022-06-05 LAB — COMPREHENSIVE METABOLIC PANEL
ALT: 19 U/L (ref 0–35)
AST: 21 U/L (ref 0–37)
Albumin: 4.5 g/dL (ref 3.5–5.2)
Alkaline Phosphatase: 69 U/L (ref 39–117)
BUN: 17 mg/dL (ref 6–23)
CO2: 26 mEq/L (ref 19–32)
Calcium: 9.6 mg/dL (ref 8.4–10.5)
Chloride: 104 mEq/L (ref 96–112)
Creatinine, Ser: 1.51 mg/dL — ABNORMAL HIGH (ref 0.40–1.20)
GFR: 35.99 mL/min — ABNORMAL LOW (ref >60.00)
Glucose, Bld: 108 mg/dL — ABNORMAL HIGH (ref 70–99)
Potassium: 4.6 mEq/L (ref 3.5–5.1)
Sodium: 140 mEq/L (ref 135–145)
Total Bilirubin: 0.2 mg/dL (ref 0.2–1.2)
Total Protein: 7.1 g/dL (ref 6.0–8.3)

## 2022-06-05 LAB — CBC
HCT: 38.3 % (ref 36.0–46.0)
Hemoglobin: 13.1 g/dL (ref 12.0–15.0)
MCHC: 34.1 g/dL (ref 30.0–36.0)
MCV: 92.9 fl (ref 78.0–100.0)
Platelets: 216 10*3/uL (ref 150.0–400.0)
RBC: 4.12 Mil/uL (ref 3.87–5.11)
RDW: 13.1 % (ref 11.5–15.5)
WBC: 7.7 10*3/uL (ref 4.0–10.5)

## 2022-06-05 LAB — LIPID PANEL
Cholesterol: 165 mg/dL (ref 0–200)
HDL: 60.5 mg/dL (ref >39.00)
LDL Cholesterol: 67 mg/dL (ref 0–99)
NonHDL: 104.73
Total CHOL/HDL Ratio: 3
Triglycerides: 188 mg/dL — ABNORMAL HIGH (ref 0.0–149.0)
VLDL: 37.6 mg/dL (ref 0.0–40.0)

## 2022-06-05 LAB — HEMOGLOBIN A1C: Hgb A1c MFr Bld: 7.4 % — ABNORMAL HIGH (ref 4.6–6.5)

## 2022-06-05 LAB — TSH: TSH: 0.99 u[IU]/mL (ref 0.35–5.50)

## 2022-08-14 DIAGNOSIS — N1832 Chronic kidney disease, stage 3b: Secondary | ICD-10-CM | POA: Diagnosis not present

## 2022-08-18 DIAGNOSIS — E1140 Type 2 diabetes mellitus with diabetic neuropathy, unspecified: Secondary | ICD-10-CM | POA: Diagnosis not present

## 2022-08-18 DIAGNOSIS — Z87891 Personal history of nicotine dependence: Secondary | ICD-10-CM | POA: Diagnosis not present

## 2022-08-18 DIAGNOSIS — E1122 Type 2 diabetes mellitus with diabetic chronic kidney disease: Secondary | ICD-10-CM | POA: Diagnosis not present

## 2022-08-18 DIAGNOSIS — N1832 Chronic kidney disease, stage 3b: Secondary | ICD-10-CM | POA: Diagnosis not present

## 2022-08-18 DIAGNOSIS — E872 Acidosis, unspecified: Secondary | ICD-10-CM | POA: Diagnosis not present

## 2022-08-18 DIAGNOSIS — Z794 Long term (current) use of insulin: Secondary | ICD-10-CM | POA: Diagnosis not present

## 2022-08-18 DIAGNOSIS — I129 Hypertensive chronic kidney disease with stage 1 through stage 4 chronic kidney disease, or unspecified chronic kidney disease: Secondary | ICD-10-CM | POA: Diagnosis not present

## 2022-08-23 ENCOUNTER — Other Ambulatory Visit: Payer: Self-pay | Admitting: Neurology

## 2022-08-24 ENCOUNTER — Ambulatory Visit: Payer: Medicare HMO | Admitting: Neurology

## 2022-08-31 ENCOUNTER — Ambulatory Visit: Payer: Medicare HMO | Admitting: Neurology

## 2022-08-31 ENCOUNTER — Encounter: Payer: Self-pay | Admitting: Neurology

## 2022-08-31 VITALS — BP 160/88 | HR 87 | Ht 63.5 in | Wt 168.2 lb

## 2022-08-31 DIAGNOSIS — E1142 Type 2 diabetes mellitus with diabetic polyneuropathy: Secondary | ICD-10-CM | POA: Diagnosis not present

## 2022-08-31 DIAGNOSIS — R292 Abnormal reflex: Secondary | ICD-10-CM | POA: Diagnosis not present

## 2022-08-31 MED ORDER — AMITRIPTYLINE HCL 100 MG PO TABS: ORAL_TABLET | ORAL | 3 refills | Status: DC

## 2022-08-31 MED ORDER — CARBAMAZEPINE 200 MG PO TABS: 200.0000 mg | ORAL_TABLET | Freq: Two times a day (BID) | ORAL | 3 refills | Status: DC

## 2022-08-31 MED ORDER — AMITRIPTYLINE HCL 25 MG PO TABS: 25.0000 mg | ORAL_TABLET | Freq: Every day | ORAL | 3 refills | Status: DC

## 2022-08-31 NOTE — Progress Notes (Signed)
Follow-up Visit   Date: 08/31/22    Caroline Simmons MRN: [id number] DOB: 07/21/1955   Interim History: Caroline Simmons is a 67 y.o. right-handed African American female with type 2 diabetes mellitus complicated by stage III CKD and neuropathy, hypertension, and hypothyroidism returning to the clinic for follow-up of painful diabetic neuropathy.  The patient was accompanied to the clinic by self.   IMPRESSION/PLAN: Painful diabetic neuropathy, small fiber, stable  - Previously tried: Lyrica, gabapentin, Cymbalta, Lamictal, and Dilantin  - Continue amitriptyline '100mg'$  at bedtime and amitriptyline '25mg'$  in the morning as needed for severe pain  - Continue carbamazepine '200mg'$  twice daily  2.  Hyperreflexia in the legs may indicate lumbar canal stenosis.  She denies low back pain, leg weakness, or imbalance.  - MRI lumbar spine deferred.  She says that she had MRI in 2023, but I do not see anything in Epic or CareEverywhere  - She was instructed to let me know if she develops worsening leg pain, back pain, or new weakness.   - Reduce carbamazepine to '200mg'$  twice daily  Return to clinic in 1 year    ----------------------------------------------------------- History of present illness: Starting around 2008, she started having numbness, burning, and tingling of the feet. Her pain is above the level of the ankles and constant.  She denies any imbalance and walks unassisted.  She has mild tingling in the left hand, which is intermittent.  She has noticed greater difficulty with opening jars and holding onto objects.   She is trying very hard to keep her diabetes controlled with diet, last HbA1c 6.4.    She was followed by Dr[facility] Rema Jasmine at [facility].  Per  records, she had electrodiagnostic testing in March 2015 which did not show large fiber neuropathy.  She has previously tried Lyrica, gabapentin, cymbalta, lamictal, and dilantin.  She has the greatest benefit with  amitriptyline 100 mg at bedtime and Tegretol 400 mg in the morning and 200 mg at bedtime.  She was on a higher dose of Tegretol in the past, however, due to her renal function and cognitive side effects, but the dose was reduced. For severe pain, she takes hydrocodone 1-2 times per week.  She did not wish to see pain management in the past, when it was recommended.    UPDATE 08/22/2021:  She is here for follow-up visit.  She reports that her neuropathy has been stable and pain is well-controlled on carbamazepine and amitriptyline.  Sometimes, she only takes carbamazepine '200mg'$  twice daily (not '400mg'$  in the morning) and feels that pain is still controlled.  She is trying to walk more and balance is good.  She had two falls last year after, one from tripping over house plant. Last HbA1c 7.0.  UPDATE 08/31/2022:  She is here 1 year visit.  Neuropathy has been stable and despite reducing carbamazepine to '200mg'$  BID, pain is controlled.  She is also on amitriptyline '100mg'$  at bedtime and takes extra '25mg'$  as needed for severe pain.  She denies low back pain, leg weakness, or radicular leg pain.  Currently, she is having a lot of pain in the right ankle from gout.  She walks unassisted, no falls.   Medications:  Current Outpatient Medications on File Prior to Visit  Medication Sig Dispense Refill   Alcohol Swabs (B-D SINGLE USE SWABS REGULAR) PADS USE  1  SWAB EVERY DAY 100 each 3   allopurinol (ZYLOPRIM) 100 MG tablet TAKE 1 TABLET BY MOUTH EVERY DAY  90 tablet 1   amitriptyline (ELAVIL) 25 MG tablet Take 1 tablet (25 mg total) by mouth daily. 90 tablet 3   aspirin 81 MG tablet Take 81 mg by mouth daily.     carbamazepine (TEGRETOL) 200 MG tablet Take 1 tablet (200 mg total) by mouth in the morning and at bedtime. 180 tablet 3   colchicine 0.6 MG tablet Take 1 tablet (0.6 mg total) by mouth 2 (two) times daily. 60 tablet 2   finasteride (PROSCAR) 5 MG tablet      glucose blood (TRUE METRIX PRO BLOOD GLUCOSE)  test strip Use as instructed 100 each 12   hydrochlorothiazide (MICROZIDE) 12.5 MG capsule Take by mouth.     levothyroxine (SYNTHROID) 50 MCG tablet Take 1 tablet (50 mcg total) by mouth daily. 90 tablet 3   lisinopril (ZESTRIL) 20 MG tablet Take 1 tablet (20 mg total) by mouth daily. 90 tablet 3   omeprazole (PRILOSEC) 20 MG capsule TAKE 1- 2 CAPSULES ONCE A DAY FOR NAUSEA AND REFLUX. USE AS NEEDED- DOES NOT HAVE TO TAKE EVERY DAY 180 capsule 1   rosuvastatin (CRESTOR) 20 MG tablet TAKE 1 TABLET EVERY DAY 90 tablet 1   sodium bicarbonate 650 MG tablet Take 650 mg by mouth 2 (two) times daily.     amitriptyline (ELAVIL) 100 MG tablet TAKE 1 TABLET BY MOUTH EVERYDAY AT BEDTIME (Patient not taking: Reported on 08/31/2022) 90 tablet 3   HYDROcodone-acetaminophen (NORCO/VICODIN) 5-325 MG tablet Take 1 tablet by mouth every 8 (eight) hours as needed. (Patient not taking: Reported on 08/31/2022) 8 tablet 0   predniSONE (DELTASONE) 10 MG tablet Take 1 tablet (10 mg total) by mouth daily with breakfast. (Patient not taking: Reported on 08/31/2022) 6 tablet 0   No current facility-administered medications on file prior to visit.    Allergies:  Allergies  Allergen Reactions   Codeine Nausea Only    Pt states she has taken this with no problems.   Zoloft  [Sertraline Hcl]    Lamotrigine Rash    Other reaction(s): RASH    Vital Signs:  BP (!) 162/92   Pulse 87   Ht 5' 3.5" (1.613 m)   Wt 168 lb 3.2 oz (76.3 kg)   SpO2 99%   BMI 29.33 kg/m    Neurological Exam: MENTAL STATUS including orientation to time, place, person, recent and remote memory, attention span and concentration, language, and fund of knowledge is normal.  Speech is not dysarthric.  MOTOR:  Motor strength is 5/5 in all extremities, except 5-/5 distal hand and toe weakness.  No atrophy, fasciculations or abnormal movements.  No pronator drift.  Tone is normal.    MSRs:                                           Right         Left brachioradialis 2+  2+  biceps 2+  2+  triceps 2+  2+  patellar 3+  3+  ankle jerk 1+  1+  Hoffman no  no  plantar response down  down   SENSORY:  Absent vibration distal to ankles bilaterally, normal at the knees and hands.   COORDINATION/GAIT:  Normal finger-to- nose-finger. Gait narrow based and stable, slow.   Data: Lab Results  Component Value Date   TSH 0.99 06/04/2022   Lab Results  Component Value  Date   HGBA1C 7.4 (H) 06/04/2022   Total time spent reviewing records, interview, history/exam, documentation, and coordination of care on day of encounter:  30 min   Thank you for allowing me to participate in patient's care.  If I can answer any additional questions, I would be pleased to do so.    Sincerely,    Tsuruko Murtha K. Posey Pronto, DO

## 2022-08-31 NOTE — Patient Instructions (Signed)
Return to clinic in 1 year.

## 2022-09-21 ENCOUNTER — Other Ambulatory Visit: Payer: Self-pay

## 2022-09-21 DIAGNOSIS — E1142 Type 2 diabetes mellitus with diabetic polyneuropathy: Secondary | ICD-10-CM

## 2022-09-21 MED ORDER — LANCETS 33G MISC: 1.0000 | Freq: Two times a day (BID) | 12 refills | Status: DC

## 2022-10-08 ENCOUNTER — Encounter: Payer: Self-pay | Admitting: *Deleted

## 2022-10-29 ENCOUNTER — Other Ambulatory Visit: Payer: Self-pay | Admitting: Family Medicine

## 2022-11-29 NOTE — Patient Instructions (Incomplete)
Good to see you again today- I will be in touch with your labs Please see me in about 6 months assuming all is well  I think seeing "physical medicine and rehab" might be helpful to look at your right foot and your balance- let me know if you would like me to place a referral for you  We will check your uric acid and determine if going up on your allopurinol would be helpful for you

## 2022-11-29 NOTE — Progress Notes (Addendum)
[facility] [address] [place], [place] [address] [phone]  Date:  12/03/2022   Name:  Caroline Simmons   DOB:  1955-12-04   MRN:  [id number]  PCP:  Pearline Cables, MD    Chief Complaint: 6 month follow up (Concerns/ questions: 1. pt would like an order for mammogram placed 2. Pt says her gout is still an issue. /Foot exam, DM urine is due/)   History of Present Illness:  Caroline Simmons is a 67 y.o. very pleasant female patient who presents with the following:  Pt seen today for periodic recheck Last seen by myself in December   History of diet controlled diabetes with neuropathy, hypertension, hyperlipidemia, chronic kidney disease, hypothyroidism  Her daughter is having a baby girl next month- her first child   She is followed by nephrology- Dr Elenore Rota with [facility] in February: Assessment/Plan:  1. Stage 3b chronic kidney disease (HCC)  2. Type 2 diabetes mellitus with stage 3b chronic kidney disease, with long-term current use of insulin (HCC)  3. Primary hypertension  4. Metabolic acidemia  CKD stage III 2/2 DM, HTN with proteinuria. Baseline 1.4-1.6. - Creatinine stable - Electrolytes acceptable - CO2 acceptable on sodium bicarbonate. Continue - Diabetes uncontrolled in December. Following with PCP - Blood pressure well-controlled Return in about 4 months (around 12/17/2022).   Visit with neurology in March: IMPRESSION/PLAN: Painful diabetic neuropathy, small fiber, stable              - Previously tried: Lyrica, gabapentin, Cymbalta, Lamictal, and Dilantin              - Continue amitriptyline 100mg  at bedtime and amitriptyline 25mg  in the morning as needed for severe pain              - Continue carbamazepine 200mg  twice daily 2.  Hyperreflexia in the legs may indicate lumbar canal stenosis.  She denies low back pain, leg weakness, or imbalance.              - MRI lumbar spine deferred.  She says that  she had MRI in 2023, but I do not see anything in Epic or CareEverywhere              - She was instructed to let me know if she develops worsening leg pain, back pain, or new weakness.               - Reduce carbamazepine to 200mg  twice daily Return to clinic in 1 year    Lab Results  Component Value Date   HGBA1C 7.4 (H) 06/04/2022   Urine micro due- will update today  Foot exam due Pt notes she is always hungry and wonders if something can be done about this Wt Readings from Last 3 Encounters:  12/03/22 167 lb (75.8 kg)  08/31/22 168 lb 3.2 oz (76.3 kg)  06/04/22 166 lb 12.8 oz (75.7 kg)   She has "gout" in her right ankle- it will come and go.  I am not clear that she has ever been actually diagnosed with gout She does have known peripheral neuropathy and also has some congenital deformity of the right foot; she mentions when she was a child she was told there was a problem with her foot and it has always looked different than the left.  She is not sure of her exact diagnosis   Patient Active Problem List   Diagnosis  Date Noted   Acute gout due to renal impairment involving right ankle 03/23/2022   Peroneal tenosynovitis 12/17/2021   Arthrosis of left midfoot 10/27/2021   Stress fracture of calcaneus 09/02/2021   Pes cavus of both feet 09/02/2021   Abnormal urinalysis 09/14/2019   Vitamin D deficiency 08/10/2017   Secondary hyperparathyroidism of renal origin (HCC) 07/30/2016   Chronic kidney disease, stage 3 (HCC) 04/08/2016   Dyslipidemia 04/02/2016   Proteinuria 04/02/2016   Fatigue 12/25/2013   Neuropathy 12/25/2013   Dyslipidemia associated with type 2 diabetes mellitus (HCC) 05/30/2013   Detached retina, left 11/25/2011   Depression with anxiety 11/25/2011   Neuropathy in diabetes (HCC) 09/15/2011   Insomnia, unspecified    Hypothyroidism    DIABETES MELLITUS, TYPE II 08/26/2010   Essential hypertension 08/26/2010   RECTAL PAIN 08/26/2010   Peripheral motor  neuropathy 06/22/2009    Past Medical History:  Diagnosis Date   Alopecia    Anxiety    on meds   Arthritis    back   Cataract 2011   bilateral removal   Chronic kidney disease (CKD), stage III (moderate) (HCC)    Depression    on meds   Essential hypertension, benign    on meds   GERD (gastroesophageal reflux disease)    on meds   Hyperlipidemia    on meds   Hypothyroid    on meds   Insomnia, unspecified    Menopause    lmp 06/2007   Neuropathy    per pt   Type II or unspecified type diabetes mellitus without mention of complication, not stated as uncontrolled    on meds   Vitamin D deficiency     Past Surgical History:  Procedure Laterality Date   CATARACT EXTRACTION, BILATERAL     CESAREAN SECTION     1982/1991   COLONOSCOPY  2012   [facility]-F/V-movi(exc)-HPP   POLYPECTOMY  2012   HPP   RETINAL DETACHMENT REPAIR W/ SCLERAL BUCKLE LE  11/12/2011   WISDOM TOOTH EXTRACTION      Social History   Tobacco Use   Smoking status: Former    Packs/day: 1.00    Years: 3.00    Additional pack years: 0.00    Total pack years: 3.00    Types: Cigarettes   Smokeless tobacco: Never   Tobacco comments:    quit 08/2003  Vaping Use   Vaping Use: Never used  Substance Use Topics   Alcohol use: Not Currently   Drug use: No    Family History  Problem Relation Age of Onset   Diabetes Mother        type 2  not sure onset   Diabetes Other    Hypertension Other    Colon cancer Neg Hx    Colon polyps Neg Hx    Esophageal cancer Neg Hx    Stomach cancer Neg Hx    Rectal cancer Neg Hx     Allergies  Allergen Reactions   Codeine Nausea Only    Pt states she has taken this with no problems.   Zoloft  [Sertraline Hcl]    Lamotrigine Rash    Other reaction(s): RASH    Medication list has been reviewed and updated.  Current Outpatient Medications on File Prior to Visit  Medication Sig Dispense Refill   Alcohol Swabs (B-D SINGLE USE SWABS REGULAR) PADS USE  1   SWAB EVERY DAY 100 each 3   allopurinol (ZYLOPRIM) 100 MG tablet TAKE 1 TABLET  BY MOUTH EVERY DAY 90 tablet 1   amitriptyline (ELAVIL) 100 MG tablet TAKE 1 TABLET BY MOUTH EVERYDAY AT BEDTIME 90 tablet 3   aspirin 81 MG tablet Take 81 mg by mouth daily.     carbamazepine (TEGRETOL) 200 MG tablet Take 1 tablet (200 mg total) by mouth in the morning and at bedtime. 180 tablet 3   colchicine 0.6 MG tablet Take 1 tablet (0.6 mg total) by mouth 2 (two) times daily. 60 tablet 2   finasteride (PROSCAR) 5 MG tablet      glucose blood (TRUE METRIX PRO BLOOD GLUCOSE) test strip Use as instructed 100 each 12   hydrochlorothiazide (MICROZIDE) 12.5 MG capsule Take by mouth.     HYDROcodone-acetaminophen (NORCO/VICODIN) 5-325 MG tablet Take 1 tablet by mouth every 8 (eight) hours as needed. 8 tablet 0   Lancets 33G MISC 1 each by Does not apply route in the morning and at bedtime. E11.42 100 each 12   levothyroxine (SYNTHROID) 50 MCG tablet Take 1 tablet (50 mcg total) by mouth daily. 90 tablet 3   lisinopril (ZESTRIL) 20 MG tablet Take 1 tablet (20 mg total) by mouth daily. 90 tablet 3   omeprazole (PRILOSEC) 20 MG capsule TAKE 1- 2 CAPSULES ONCE A DAY FOR NAUSEA AND REFLUX. USE AS NEEDED- DOES NOT HAVE TO TAKE EVERY DAY 180 capsule 1   rosuvastatin (CRESTOR) 20 MG tablet TAKE 1 TABLET EVERY DAY 90 tablet 1   sodium bicarbonate 650 MG tablet Take 650 mg by mouth 2 (two) times daily.     No current facility-administered medications on file prior to visit.    Review of Systems:  As per HPI- otherwise negative.   Physical Examination: Vitals:   12/03/22 1601  BP: 122/80  Pulse: 81  Resp: 18  Temp: 97.8 F (36.6 C)  SpO2: 98%   Vitals:   12/03/22 1601  Weight: 167 lb (75.8 kg)  Height: 5\' 3"  (1.6 m)   Body mass index is 29.58 kg/m. Ideal Body Weight: Weight in (lb) to have BMI = 25: 140.8  GEN: no acute distress.  Overweight, looks her normal self HEENT: Atraumatic, Normocephalic.  Ears  and Nose: No external deformity. CV: RRR, No M/G/R. No JVD. No thrill. No extra heart sounds. PULM: CTA B, no wheezes, crackles, rhonchi. No retractions. No resp. distress. No accessory muscle use. EXTR: No c/c/e PSYCH: Normally interactive. Conversant.  Foot exam: Normal pulses.  She is able to sense about 50% of monofilament locations bilaterally Her right foot is visually smaller than the left, may have an abnormally high arch I am not able to reproduce any pain with manipulation of the ankle.  The right Achilles is quite tight Assessment and Plan: Controlled type 2 diabetes mellitus with diabetic polyneuropathy, without long-term current use of insulin (HCC) - Plan: Basic metabolic panel, Hemoglobin A1c, Microalbumin / creatinine urine ratio  Hypothyroidism due to acquired atrophy of thyroid - Plan: TSH  Essential hypertension  Dyslipidemia associated with type 2 diabetes mellitus (HCC)  Diabetic mononeuropathy associated with type 2 diabetes mellitus (HCC)  Encounter for screening mammogram for malignant neoplasm of breast - Plan: MM 3D SCREENING MAMMOGRAM BILATERAL BREAST  Acute idiopathic gout of right ankle - Plan: Uric acid Patient seen today for follow-up Will evaluate diabetes control with base metabolic, A1c, check for microalbuminuria Follow-up on her thyroid-she is taking levothyroxine Blood pressure is well-controlled with current medication Neuropathy is still present, she is managed by neurology Ordered mammogram Patient  notes concern of "gout" causing right ankle pain.  Liver, on exam I do this is more likely related to congenital foot malformation and neuropathy.  I suggested having her seen by physical medicine & rehabilitation to see if they have any ideas for her.  However, patient does not wish to drive to [facility] to see them Will obtain uric acid level today to help determine if this is actually gout  Signed Abbe Amsterdam, MD Received labs 6/14- letter to  patient   Results for orders placed or performed in visit on 12/03/22  Basic metabolic panel  Result Value Ref Range   Sodium 139 135 - 145 mEq/L   Potassium 4.5 3.5 - 5.1 mEq/L   Chloride 104 96 - 112 mEq/L   CO2 23 19 - 32 mEq/L   Glucose, Bld 156 (H) 70 - 99 mg/dL   BUN 18 6 - 23 mg/dL   Creatinine, Ser 5.40 (H) 0.40 - 1.20 mg/dL   GFR 98.11 (L) >91.47 mL/min   Calcium 9.3 8.4 - 10.5 mg/dL  Hemoglobin W2N  Result Value Ref Range   Hgb A1c MFr Bld 7.3 (H) 4.6 - 6.5 %  TSH  Result Value Ref Range   TSH 1.53 0.35 - 5.50 uIU/mL  Microalbumin / creatinine urine ratio  Result Value Ref Range   Microalb, Ur 99.6 (H) 0.0 - 1.9 mg/dL   Creatinine,U 562.1 mg/dL   Microalb Creat Ratio 67.9 (H) 0.0 - 30.0 mg/g  Uric acid  Result Value Ref Range   Uric Acid, Serum 5.4 2.4 - 7.0 mg/dL

## 2022-12-03 ENCOUNTER — Ambulatory Visit (INDEPENDENT_AMBULATORY_CARE_PROVIDER_SITE_OTHER): Payer: Medicare HMO | Admitting: Family Medicine

## 2022-12-03 VITALS — BP 122/80 | HR 81 | Temp 97.8°F | Resp 18 | Ht 63.0 in | Wt 167.0 lb

## 2022-12-03 DIAGNOSIS — E1142 Type 2 diabetes mellitus with diabetic polyneuropathy: Secondary | ICD-10-CM | POA: Diagnosis not present

## 2022-12-03 DIAGNOSIS — Z1231 Encounter for screening mammogram for malignant neoplasm of breast: Secondary | ICD-10-CM

## 2022-12-03 DIAGNOSIS — E034 Atrophy of thyroid (acquired): Secondary | ICD-10-CM | POA: Diagnosis not present

## 2022-12-03 DIAGNOSIS — I10 Essential (primary) hypertension: Secondary | ICD-10-CM

## 2022-12-03 DIAGNOSIS — E1141 Type 2 diabetes mellitus with diabetic mononeuropathy: Secondary | ICD-10-CM

## 2022-12-03 DIAGNOSIS — E785 Hyperlipidemia, unspecified: Secondary | ICD-10-CM | POA: Diagnosis not present

## 2022-12-03 DIAGNOSIS — E1169 Type 2 diabetes mellitus with other specified complication: Secondary | ICD-10-CM

## 2022-12-03 DIAGNOSIS — M10071 Idiopathic gout, right ankle and foot: Secondary | ICD-10-CM | POA: Diagnosis not present

## 2022-12-04 LAB — MICROALBUMIN / CREATININE URINE RATIO
Creatinine,U: 146.7 mg/dL
Microalb Creat Ratio: 67.9 mg/g — ABNORMAL HIGH (ref 0.0–30.0)
Microalb, Ur: 99.6 mg/dL — ABNORMAL HIGH (ref 0.0–1.9)

## 2022-12-04 LAB — URIC ACID: Uric Acid, Serum: 5.4 mg/dL (ref 2.4–7.0)

## 2022-12-04 LAB — BASIC METABOLIC PANEL
BUN: 18 mg/dL (ref 6–23)
CO2: 23 mEq/L (ref 19–32)
Calcium: 9.3 mg/dL (ref 8.4–10.5)
Chloride: 104 mEq/L (ref 96–112)
Creatinine, Ser: 1.64 mg/dL — ABNORMAL HIGH (ref 0.40–1.20)
GFR: 32.48 mL/min — ABNORMAL LOW (ref >60.00)
Glucose, Bld: 156 mg/dL — ABNORMAL HIGH (ref 70–99)
Potassium: 4.5 mEq/L (ref 3.5–5.1)
Sodium: 139 mEq/L (ref 135–145)

## 2022-12-04 LAB — TSH: TSH: 1.53 u[IU]/mL (ref 0.35–5.50)

## 2022-12-04 LAB — HEMOGLOBIN A1C: Hgb A1c MFr Bld: 7.3 % — ABNORMAL HIGH (ref 4.6–6.5)

## 2022-12-11 DIAGNOSIS — N1832 Chronic kidney disease, stage 3b: Secondary | ICD-10-CM | POA: Diagnosis not present

## 2022-12-15 ENCOUNTER — Ambulatory Visit (HOSPITAL_BASED_OUTPATIENT_CLINIC_OR_DEPARTMENT_OTHER)
Admission: RE | Admit: 2022-12-15 | Discharge: 2022-12-15 | Disposition: A | Discharge status: Home / Self Care | Payer: Medicare HMO | Source: Ambulatory Visit | Attending: Family Medicine | Admitting: Family Medicine

## 2022-12-15 ENCOUNTER — Encounter (HOSPITAL_BASED_OUTPATIENT_CLINIC_OR_DEPARTMENT_OTHER): Payer: Self-pay

## 2022-12-15 DIAGNOSIS — Z1231 Encounter for screening mammogram for malignant neoplasm of breast: Secondary | ICD-10-CM | POA: Diagnosis not present

## 2022-12-17 DIAGNOSIS — E1122 Type 2 diabetes mellitus with diabetic chronic kidney disease: Secondary | ICD-10-CM | POA: Diagnosis not present

## 2022-12-17 DIAGNOSIS — N1832 Chronic kidney disease, stage 3b: Secondary | ICD-10-CM | POA: Diagnosis not present

## 2022-12-17 DIAGNOSIS — R809 Proteinuria, unspecified: Secondary | ICD-10-CM | POA: Diagnosis not present

## 2022-12-21 ENCOUNTER — Telehealth: Payer: Self-pay | Admitting: Family Medicine

## 2022-12-21 DIAGNOSIS — Q669 Congenital deformity of feet, unspecified, unspecified foot: Secondary | ICD-10-CM

## 2022-12-21 NOTE — Telephone Encounter (Signed)
Pt called stating that she was supposed to be referred to a specialist to look at her swelling ankles. After reviewing chart, did not see an active referral for this and advised a message would be sent back to look into this for her.

## 2022-12-21 NOTE — Telephone Encounter (Signed)
Is this what physical medicine and rehab was for? If so I can not tell I the referral was placed.

## 2022-12-22 NOTE — Telephone Encounter (Signed)
Tried calling the pt, no answer. Will try again later.

## 2022-12-22 NOTE — Addendum Note (Signed)
Addended by: Abbe Amsterdam C on: 12/22/2022 12:56 PM   Modules accepted: Orders

## 2022-12-28 ENCOUNTER — Telehealth: Payer: Self-pay | Admitting: Family Medicine

## 2022-12-28 NOTE — Telephone Encounter (Signed)
Copied from CRM [id number]. Topic: Medicare AWV >> Dec 28, 2022  2:33 PM Payton Doughty wrote: Reason for CRM: N/A 12/28/2022 FOR AWV - NO VOICEMAIL called 12/28/22 to r/s AWV on 01/01/2023.  Verlee Rossetti; Care Guide Ambulatory Clinical Support [facility] Direct Dial: [phone]

## 2023-01-07 ENCOUNTER — Ambulatory Visit: Payer: Medicare HMO | Admitting: *Deleted

## 2023-01-07 DIAGNOSIS — Z0000 Encounter for general adult medical examination without abnormal findings: Secondary | ICD-10-CM

## 2023-01-07 DIAGNOSIS — Z5941 Food insecurity: Secondary | ICD-10-CM | POA: Diagnosis not present

## 2023-01-07 NOTE — Progress Notes (Signed)
Subjective:   Caroline Simmons is a 67 y.o. female who presents for Medicare Annual (Subsequent) preventive examination.  Visit Complete: Virtual  I connected with  [name] on 01/07/23 by a audio enabled telemedicine application and verified that I am speaking with the correct person using two identifiers.  Patient Location: Home  Provider Location: Office/Clinic  I discussed the limitations of evaluation and management by telemedicine. The patient expressed understanding and agreed to proceed.   Review of Systems     Cardiac Risk Factors include: advanced age (>70men, >3 women);diabetes mellitus;dyslipidemia;hypertension     Objective:    Per patient no change in vitals since last visit, unable to obtain new vitals due to telehealth visit      01/07/2023    1:49 PM 08/31/2022    1:34 PM 12/30/2021    1:49 PM 08/22/2021    1:36 PM 12/24/2020    1:04 PM 08/19/2020    2:36 PM 09/14/2019    8:04 AM  Advanced Directives  Does Patient Have a Medical Advance Directive? Yes Yes Yes Yes Yes Yes Yes  Type of Advance Directive Living will Living will Living will Living will Living will Living will   Does patient want to make changes to medical advance directive? No - Patient declined  No - Patient declined        Current Medications (verified) Outpatient Encounter Medications as of 01/07/2023  Medication Sig   Alcohol Swabs (B-D SINGLE USE SWABS REGULAR) PADS USE  1  SWAB EVERY DAY   allopurinol (ZYLOPRIM) 100 MG tablet TAKE 1 TABLET BY MOUTH EVERY DAY   amitriptyline (ELAVIL) 100 MG tablet TAKE 1 TABLET BY MOUTH EVERYDAY AT BEDTIME   aspirin 81 MG tablet Take 81 mg by mouth daily.   carbamazepine (TEGRETOL) 200 MG tablet Take 1 tablet (200 mg total) by mouth in the morning and at bedtime.   colchicine 0.6 MG tablet Take 1 tablet (0.6 mg total) by mouth 2 (two) times daily.   finasteride (PROSCAR) 5 MG tablet    glucose blood (TRUE METRIX PRO BLOOD GLUCOSE) test strip Use as  instructed   hydrochlorothiazide (MICROZIDE) 12.5 MG capsule Take by mouth.   HYDROcodone-acetaminophen (NORCO/VICODIN) 5-325 MG tablet Take 1 tablet by mouth every 8 (eight) hours as needed.   Lancets 33G MISC 1 each by Does not apply route in the morning and at bedtime. E11.42   levothyroxine (SYNTHROID) 50 MCG tablet Take 1 tablet (50 mcg total) by mouth daily.   lisinopril (ZESTRIL) 20 MG tablet Take 1 tablet (20 mg total) by mouth daily.   omeprazole (PRILOSEC) 20 MG capsule TAKE 1- 2 CAPSULES ONCE A DAY FOR NAUSEA AND REFLUX. USE AS NEEDED- DOES NOT HAVE TO TAKE EVERY DAY   rosuvastatin (CRESTOR) 20 MG tablet TAKE 1 TABLET EVERY DAY   sodium bicarbonate 650 MG tablet Take 650 mg by mouth 2 (two) times daily.   No facility-administered encounter medications on file as of 01/07/2023.    Allergies (verified) Codeine, Zoloft  [sertraline hcl], and Lamotrigine   History: Past Medical History:  Diagnosis Date   Alopecia    Anxiety    on meds   Arthritis    back   Cataract 2011   bilateral removal   Chronic kidney disease (CKD), stage III (moderate) (HCC)    Depression    on meds   Essential hypertension, benign    on meds   GERD (gastroesophageal reflux disease)    on meds  Hyperlipidemia    on meds   Hypothyroid    on meds   Insomnia, unspecified    Menopause    lmp 06/2007   Neuropathy    per pt   Type II or unspecified type diabetes mellitus without mention of complication, not stated as uncontrolled    on meds   Vitamin D deficiency    Past Surgical History:  Procedure Laterality Date   CATARACT EXTRACTION, BILATERAL     CESAREAN SECTION     1982/1991   COLONOSCOPY  2012   [facility]-F/V-movi(exc)-HPP   POLYPECTOMY  2012   HPP   RETINAL DETACHMENT REPAIR W/ SCLERAL BUCKLE LE  11/12/2011   WISDOM TOOTH EXTRACTION     Family History  Problem Relation Age of Onset   Diabetes Mother        type 2  not sure onset   Diabetes Other    Hypertension Other     Colon cancer Neg Hx    Colon polyps Neg Hx    Esophageal cancer Neg Hx    Stomach cancer Neg Hx    Rectal cancer Neg Hx    Social History   Socioeconomic History   Marital status: Divorced    Spouse name: Not on file   Number of children: 3   Years of education: 14   Highest education level: Not on file  Occupational History   Occupation: disabled  Tobacco Use   Smoking status: Former    Current packs/day: 1.00    Average packs/day: 1 pack/day for 3.0 years (3.0 ttl pk-yrs)    Types: Cigarettes   Smokeless tobacco: Never   Tobacco comments:    quit 08/2003  Vaping Use   Vaping status: Never Used  Substance and Sexual Activity   Alcohol use: Not Currently   Drug use: No   Sexual activity: Not Currently  Other Topics Concern   Not on file  Social History Narrative   Lives alone in a 2 story home.  Has 3 children.  On disability.  Did work for [employer] for 29 years.  Education: some college.       Right handed        Are you currently employed ? No    What is your current occupation?disability   Do you live at home alone? yes   Who lives with you?    What type of home do you live in: 1 story or 2 story? two    Caffeine 1 cup    Social Determinants of Health   Financial Resource Strain: Medium Risk (01/07/2023)   Overall Financial Resource Strain (CARDIA)    Difficulty of Paying Living Expenses: Somewhat hard  Food Insecurity: Food Insecurity Present (01/07/2023)   Hunger Vital Sign    Worried About Running Out of Food in the Last Year: Sometimes true    Ran Out of Food in the Last Year: Never true  Transportation Needs: No Transportation Needs (01/07/2023)   PRAPARE - Administrator, Civil Service (Medical): No    Lack of Transportation (Non-Medical): No  Physical Activity: Inactive (01/07/2023)   Exercise Vital Sign    Days of Exercise per Week: 0 days    Minutes of Exercise per Session: 0 min  Stress: Stress Concern Present (01/07/2023)    [employer] of Occupational Health - Occupational Stress Questionnaire    Feeling of Stress : To some extent  Social Connections: Socially Isolated (12/24/2020)   Social Connection and Isolation Panel [  NHANES]    Frequency of Communication with Friends and Family: More than three times a week    Frequency of Social Gatherings with Friends and Family: More than three times a week    Attends Religious Services: Never    Database administrator or Organizations: No    Attends Banker Meetings: Never    Marital Status: Divorced    Tobacco Counseling Counseling given: Not Answered Tobacco comments: quit 08/2003   Clinical Intake:  Pre-visit preparation completed: Yes  Pain : No/denies pain  Nutritional Risks: None Diabetes: Yes CBG done?: No Did pt. bring in CBG monitor from home?: No  How often do you need to have someone help you when you read instructions, pamphlets, or other written materials from your doctor or pharmacy?: 1 - Never  Interpreter Needed?: No  Information entered by :: Donne Anon, CMA   Activities of Daily Living    01/07/2023    1:43 PM  In your present state of health, do you have any difficulty performing the following activities:  Hearing? 0  Vision? 0  Difficulty concentrating or making decisions? 1  Comment sometimes remembering  Walking or climbing stairs? 1  Dressing or bathing? 0  Doing errands, shopping? 0  Preparing Food and eating ? N  Using the Toilet? N  In the past six months, have you accidently leaked urine? N  Do you have problems with loss of bowel control? N  Managing your Medications? N  Managing your Finances? N  Housekeeping or managing your Housekeeping? N    Patient Care Team: Copland, Gwenlyn Found, MD as PCP - General (Family Medicine) Tracey Harries, MD as Consulting Physician (Obstetrics and Gynecology) Barnabas Lister, MD as Referring Physician (Nephrology) Glendale Chard, DO as Consulting Physician  (Neurology)  Indicate any recent Medical Services you may have received from other than [facility] providers in the past year (date may be approximate).     Assessment:   This is a routine wellness examination for Javanna.  Hearing/Vision screen No results found.  Dietary issues and exercise activities discussed:     Goals Addressed   None    Depression Screen    01/07/2023    1:49 PM 12/30/2021    1:50 PM 12/24/2020    1:06 PM 12/02/2020   11:28 AM 08/24/2019    3:00 PM 08/19/2018    2:15 PM 05/27/2016    2:23 PM  PHQ 2/9 Scores  PHQ - 2 Score 0 1 1 0 0 0 3  PHQ- 9 Score       11    Fall Risk    01/07/2023    1:45 PM 08/31/2022    1:34 PM 12/30/2021    1:50 PM 08/25/2021    1:48 PM 08/22/2021    1:34 PM  Fall Risk   Falls in the past year? 1 1 1 1 1   Number falls in past yr: 0 1 0 0 0  Injury with Fall? 1 1 0 1 1  Risk for fall due to : History of fall(s)  Impaired balance/gait Impaired balance/gait;Impaired mobility   Follow up Falls evaluation completed Falls evaluation completed Falls evaluation completed Falls evaluation completed;Falls prevention discussed     MEDICARE RISK AT HOME:  Medicare Risk at Home - 01/07/23 1345     Any stairs in or around the home? Yes    If so, are there any without handrails? No    Home free of loose throw rugs in  walkways, pet beds, electrical cords, etc? Yes    Adequate lighting in your home to reduce risk of falls? Yes    Life alert? No    Use of a cane, walker or w/c? Yes    Grab bars in the bathroom? No    Shower chair or bench in shower? No    Elevated toilet seat or a handicapped toilet? No             TIMED UP AND GO:  Was the test performed?  No    Cognitive Function:    08/19/2018    2:16 PM 05/27/2016    2:30 PM  MMSE - Mini Mental State Exam  Orientation to time 5 5  Orientation to Place 5 5  Registration 3 3  Attention/ Calculation 5 4  Recall 1 2  Language- name 2 objects 2 2  Language- repeat 0 1  Language-  follow 3 step command 3 3  Language- read & follow direction 1 1  Write a sentence 1 1  Copy design 1 1  Total score 27 28        01/07/2023    1:50 PM 12/30/2021    2:00 PM  6CIT Screen  What Year? 0 points 0 points  What month? 0 points 0 points  What time? 0 points 0 points  Count back from 20 2 points 0 points  Months in reverse 0 points 0 points  Repeat phrase 4 points 10 points  Total Score 6 points 10 points    Immunizations Immunization History  Administered Date(s) Administered   Fluad Quad(high Dose 65+) 06/04/2022   Influenza Split 05/13/2012   Influenza,inj,Quad PF,6+ Mos 05/30/2013, 04/05/2014, 03/13/2015, 02/19/2016, 02/16/2018, 03/06/2019, 06/04/2021   Influenza-Unspecified 04/08/2011   PFIZER(Purple Top)SARS-COV-2 Vaccination 09/18/2019, 10/11/2019, 05/24/2020   PNEUMOCOCCAL CONJUGATE-20 12/02/2020   Pneumococcal Polysaccharide-23 05/01/2015   Td 09/20/2006   Tdap 01/06/2017   Zoster Recombinant(Shingrix) 12/14/2021    TDAP status: Up to date  Flu Vaccine status: Up to date  Pneumococcal vaccine status: Up to date  Covid-19 vaccine status: Information provided on how to obtain vaccines.   Qualifies for Shingles Vaccine? Yes   Zostavax completed No   Shingrix Completed?: No.    Education has been provided regarding the importance of this vaccine. Patient has been advised to call insurance company to determine out of pocket expense if they have not yet received this vaccine. Advised may also receive vaccine at local pharmacy or Health Dept. Verbalized acceptance and understanding.  Screening Tests Health Maintenance  Topic Date Due   COVID-19 Vaccine (4 - 2023-24 season) 02/20/2022   FOOT EXAM  06/04/2022   Medicare Annual Wellness (AWV)  12/31/2022   INFLUENZA VACCINE  01/21/2023   OPHTHALMOLOGY EXAM  03/12/2023   HEMOGLOBIN A1C  06/04/2023   Diabetic kidney evaluation - eGFR measurement  12/03/2023   Diabetic kidney evaluation - Urine ACR   12/03/2023   Colonoscopy  02/06/2024   MAMMOGRAM  12/14/2024   DTaP/Tdap/Td (3 - Td or Tdap) 01/07/2027   Pneumonia Vaccine 62+ Years old  Completed   Hepatitis C Screening  Completed   Zoster Vaccines- Shingrix  Completed   HPV VACCINES  Aged Out    Health Maintenance  Health Maintenance Due  Topic Date Due   COVID-19 Vaccine (4 - 2023-24 season) 02/20/2022   FOOT EXAM  06/04/2022   Medicare Annual Wellness (AWV)  12/31/2022    Colorectal cancer screening: Type of screening: Colonoscopy. Completed 02/05/21. Repeat  every 3 years  Mammogram status: Completed 12/15/22. Repeat every year  Bone Density status: Completed 12/10/20. Results reflect: Bone density results: NORMAL. Repeat every 2 years.  Lung Cancer Screening: (Low Dose CT Chest recommended if Age 20-80 years, 20 pack-year currently smoking OR have quit w/in 15years.) does not qualify.   Additional Screening:  Hepatitis C Screening: does qualify; Completed 03/13/15  Vision Screening: Recommended annual ophthalmology exams for early detection of glaucoma and other disorders of the eye. Is the patient up to date with their annual eye exam?  Yes  Who is the provider or what is the name of the office in which the patient attends annual eye exams? [facility] If pt is not established with a provider, would they like to be referred to a provider to establish care? No .   Dental Screening: Recommended annual dental exams for proper oral hygiene  Diabetic Foot Exam: Diabetic Foot Exam: Overdue, Pt has been advised about the importance in completing this exam. Pt is scheduled for diabetic foot exam on N/a.  Community Resource Referral / Chronic Care Management: CRR required this visit?  Yes  CCM required this visit?  No     Plan:     I have personally reviewed and noted the following in the patient's chart:   Medical and social history Use of alcohol, tobacco or illicit drugs  Current medications and supplements  including opioid prescriptions. Patient is currently taking opioid prescriptions. Information provided to patient regarding non-opioid alternatives. Patient advised to discuss non-opioid treatment plan with their provider. Functional ability and status Nutritional status Physical activity Advanced directives List of other physicians Hospitalizations, surgeries, and ER visits in previous 12 months Vitals Screenings to include cognitive, depression, and falls Referrals and appointments  In addition, I have reviewed and discussed with patient certain preventive protocols, quality metrics, and best practice recommendations. A written personalized care plan for preventive services as well as general preventive health recommendations were provided to patient.     Donne Anon, CMA   01/07/2023   After Visit Summary: (Declined) Due to this being a telephonic visit, with patients personalized plan was offered to patient but patient Declined AVS at this time   Nurse Notes: None

## 2023-01-07 NOTE — Patient Instructions (Signed)
Caroline Simmons , Thank you for taking time to come for your Medicare Wellness Visit. I appreciate your ongoing commitment to your health goals. Please review the following plan we discussed and let me know if I can assist you in the future.     This is a list of the screening recommended for you and due dates:  Health Maintenance  Topic Date Due   COVID-19 Vaccine (4 - 2023-24 season) 02/20/2022   Complete foot exam   06/04/2022   Flu Shot  01/21/2023   Eye exam for diabetics  03/12/2023   Hemoglobin A1C  06/04/2023   Yearly kidney function blood test for diabetes  12/03/2023   Yearly kidney health urinalysis for diabetes  12/03/2023   Medicare Annual Wellness Visit  01/07/2024   Colon Cancer Screening  02/06/2024   Mammogram  12/14/2024   DTaP/Tdap/Td vaccine (3 - Td or Tdap) 01/07/2027   Pneumonia Vaccine  Completed   Hepatitis C Screening  Completed   Zoster (Shingles) Vaccine  Completed   HPV Vaccine  Aged Out    Next appointment: Follow up in one year for your annual wellness visit.   Preventive Care 16 Years and Older, Female Preventive care refers to lifestyle choices and visits with your health care provider that can promote health and wellness. What does preventive care include? A yearly physical exam. This is also called an annual well check. Dental exams once or twice a year. Routine eye exams. Ask your health care provider how often you should have your eyes checked. Personal lifestyle choices, including: Daily care of your teeth and gums. Regular physical activity. Eating a healthy diet. Avoiding tobacco and drug use. Limiting alcohol use. Practicing safe sex. Taking low-dose aspirin every day. Taking vitamin and mineral supplements as recommended by your health care provider. What happens during an annual well check? The services and screenings done by your health care provider during your annual well check will depend on your age, overall health, lifestyle  risk factors, and family history of disease. Counseling  Your health care provider may ask you questions about your: Alcohol use. Tobacco use. Drug use. Emotional well-being. Home and relationship well-being. Sexual activity. Eating habits. History of falls. Memory and ability to understand (cognition). Work and work Astronomer. Reproductive health. Screening  You may have the following tests or measurements: Height, weight, and BMI. Blood pressure. Lipid and cholesterol levels. These may be checked every 5 years, or more frequently if you are over 2 years old. Skin check. Lung cancer screening. You may have this screening every year starting at age 80 if you have a 30-pack-year history of smoking and currently smoke or have quit within the past 15 years. Fecal occult blood test (FOBT) of the stool. You may have this test every year starting at age 67. Flexible sigmoidoscopy or colonoscopy. You may have a sigmoidoscopy every 5 years or a colonoscopy every 10 years starting at age 65. Hepatitis C blood test. Hepatitis B blood test. Sexually transmitted disease (STD) testing. Diabetes screening. This is done by checking your blood sugar (glucose) after you have not eaten for a while (fasting). You may have this done every 1-3 years. Bone density scan. This is done to screen for osteoporosis. You may have this done starting at age 81. Mammogram. This may be done every 1-2 years. Talk to your health care provider about how often you should have regular mammograms. Talk with your health care provider about your test results, treatment options, and  if necessary, the need for more tests. Vaccines  Your health care provider may recommend certain vaccines, such as: Influenza vaccine. This is recommended every year. Tetanus, diphtheria, and acellular pertussis (Tdap, Td) vaccine. You may need a Td booster every 10 years. Zoster vaccine. You may need this after age 37. Pneumococcal  13-valent conjugate (PCV13) vaccine. One dose is recommended after age 52. Pneumococcal polysaccharide (PPSV23) vaccine. One dose is recommended after age 38. Talk to your health care provider about which screenings and vaccines you need and how often you need them. This information is not intended to replace advice given to you by your health care provider. Make sure you discuss any questions you have with your health care provider. Document Released: 07/05/2015 Document Revised: 02/26/2016 Document Reviewed: 04/09/2015 Elsevier Interactive Patient Education  2017 [employer].  Fall Prevention in the Home Falls can cause injuries. They can happen to people of all ages. There are many things you can do to make your home safe and to help prevent falls. What can I do on the outside of my home? Regularly fix the edges of walkways and driveways and fix any cracks. Remove anything that might make you trip as you walk through a door, such as a raised step or threshold. Trim any bushes or trees on the path to your home. Use bright outdoor lighting. Clear any walking paths of anything that might make someone trip, such as rocks or tools. Regularly check to see if handrails are loose or broken. Make sure that both sides of any steps have handrails. Any raised decks and porches should have guardrails on the edges. Have any leaves, snow, or ice cleared regularly. Use sand or salt on walking paths during winter. Clean up any spills in your garage right away. This includes oil or grease spills. What can I do in the bathroom? Use night lights. Install grab bars by the toilet and in the tub and shower. Do not use towel bars as grab bars. Use non-skid mats or decals in the tub or shower. If you need to sit down in the shower, use a plastic, non-slip stool. Keep the floor dry. Clean up any water that spills on the floor as soon as it happens. Remove soap buildup in the tub or shower regularly. Attach  bath mats securely with double-sided non-slip rug tape. Do not have throw rugs and other things on the floor that can make you trip. What can I do in the bedroom? Use night lights. Make sure that you have a light by your bed that is easy to reach. Do not use any sheets or blankets that are too big for your bed. They should not hang down onto the floor. Have a firm chair that has side arms. You can use this for support while you get dressed. Do not have throw rugs and other things on the floor that can make you trip. What can I do in the kitchen? Clean up any spills right away. Avoid walking on wet floors. Keep items that you use a lot in easy-to-reach places. If you need to reach something above you, use a strong step stool that has a grab bar. Keep electrical cords out of the way. Do not use floor polish or wax that makes floors slippery. If you must use wax, use non-skid floor wax. Do not have throw rugs and other things on the floor that can make you trip. What can I do with my stairs? Do not leave any  items on the stairs. Make sure that there are handrails on both sides of the stairs and use them. Fix handrails that are broken or loose. Make sure that handrails are as long as the stairways. Check any carpeting to make sure that it is firmly attached to the stairs. Fix any carpet that is loose or worn. Avoid having throw rugs at the top or bottom of the stairs. If you do have throw rugs, attach them to the floor with carpet tape. Make sure that you have a light switch at the top of the stairs and the bottom of the stairs. If you do not have them, ask someone to add them for you. What else can I do to help prevent falls? Wear shoes that: Do not have high heels. Have rubber bottoms. Are comfortable and fit you well. Are closed at the toe. Do not wear sandals. If you use a stepladder: Make sure that it is fully opened. Do not climb a closed stepladder. Make sure that both sides of the  stepladder are locked into place. Ask someone to hold it for you, if possible. Clearly mark and make sure that you can see: Any grab bars or handrails. First and last steps. Where the edge of each step is. Use tools that help you move around (mobility aids) if they are needed. These include: Canes. Walkers. Scooters. Crutches. Turn on the lights when you go into a dark area. Replace any light bulbs as soon as they burn out. Set up your furniture so you have a clear path. Avoid moving your furniture around. If any of your floors are uneven, fix them. If there are any pets around you, be aware of where they are. Review your medicines with your doctor. Some medicines can make you feel dizzy. This can increase your chance of falling. Ask your doctor what other things that you can do to help prevent falls. This information is not intended to replace advice given to you by your health care provider. Make sure you discuss any questions you have with your health care provider. Document Released: 04/04/2009 Document Revised: 11/14/2015 Document Reviewed: 07/13/2014 Elsevier Interactive Patient Education  2017 [employer].

## 2023-01-08 ENCOUNTER — Telehealth: Payer: Self-pay | Admitting: *Deleted

## 2023-01-08 NOTE — Progress Notes (Signed)
  Care Coordination   Note   01/08/2023 Name: Marcell Pfeifer MRN: [id number] DOB: 05-02-1956  Kikuye Korenek is a 67 y.o. year old female who sees Copland, Gwenlyn Found, MD for primary care. I reached out to [name] by phone today to offer care coordination services.  Ms. Nakagawa was given information about Care Coordination services today including:   The Care Coordination services include support from the care team which includes your Nurse Coordinator, Clinical Social Worker, or Pharmacist.  The Care Coordination team is here to help remove barriers to the health concerns and goals most important to you. Care Coordination services are voluntary, and the patient may decline or stop services at any time by request to their care team member.   Care Coordination Consent Status: Patient agreed to services and verbal consent obtained.   Follow up plan:  Telephone appointment with care coordination team member scheduled for:  01/14/2023  Encounter Outcome:  Pt. Scheduled from referral   Burman Nieves, [facility] Care Coordination Care Guide Direct Dial: [phone]

## 2023-01-14 ENCOUNTER — Ambulatory Visit: Payer: Self-pay

## 2023-01-14 NOTE — Patient Outreach (Signed)
  Care Coordination   Initial Visit Note   01/14/2023 Name: Caroline Simmons MRN: [id number] DOB: [birth year]  Caroline Simmons is a 67 y.o. year old female who sees Copland, Gwenlyn Found, MD for primary care. I spoke with  [name] by phone today.  What matters to the patients health and wellness today?  Identify resources to assist with food insecurity related to cost.    Goals Addressed             This Visit's Progress    COMPLETED: Care Coordination Activities       Care Coordination Interventions: Outreached patient in response to referral for food insecurity Discussed the patient has difficulty affording groceries due to other bills including car payment, utilities, etc Reviewed patient has not applied for food stamps and is over income to be eligible Determined the patient is interested in visiting a food pantry to supplement food supplies Provided the patient with a list of food pantry locations in [place] Discussed plan for the patient to contact SW as needed         SDOH assessments and interventions completed:  Yes  SDOH Interventions Today    Flowsheet Row Most Recent Value  SDOH Interventions   Food Insecurity Interventions Other (Comment)  [Provided with list of food pantry locations close to pt home]        Care Coordination Interventions:  Yes, provided  Interventions Today    Flowsheet Row Most Recent Value  Chronic Disease   Chronic disease during today's visit Diabetes, Other  [Food Insecurity]  General Interventions   General Interventions Discussed/Reviewed General Interventions Discussed, Community Resources  Nutrition Interventions   Nutrition Discussed/Reviewed Nutrition Discussed          Follow up plan: No further intervention required.   Encounter Outcome:  Pt. Visit Completed   Bevelyn Ngo, BSW, CDP Social Worker, Certified Dementia Practitioner [facility] Care Management  Care Coordination [phone]

## 2023-01-14 NOTE — Patient Instructions (Signed)
Visit Information  Thank you for taking time to visit with me today. Please don't hesitate to contact me if I can be of assistance to you.   Following are the goals we discussed today:  - Review resource list provided; contact resource directly to request assistance   If you are experiencing a Mental Health or Behavioral Health Crisis or need someone to talk to, please call the Suicide and Crisis Lifeline: 988 call 911  The patient verbalized understanding of instructions, educational materials, and care plan provided today and DECLINED offer to receive copy of patient instructions, educational materials, and care plan.   No further follow up required: Please contact me as needed.  Bevelyn Ngo, BSW, CDP Social Worker, Certified Dementia Practitioner [facility] Care Management  Care Coordination [phone]

## 2023-01-18 ENCOUNTER — Encounter: Payer: Self-pay | Admitting: Physical Medicine and Rehabilitation

## 2023-01-28 ENCOUNTER — Encounter: Payer: Medicare HMO | Admitting: Physical Medicine and Rehabilitation

## 2023-02-16 ENCOUNTER — Encounter: Payer: Self-pay | Admitting: Physical Medicine and Rehabilitation

## 2023-02-16 ENCOUNTER — Encounter: Payer: Medicare HMO | Attending: Physical Medicine and Rehabilitation | Admitting: Physical Medicine and Rehabilitation

## 2023-02-16 VITALS — BP 138/83 | HR 82 | Ht 63.0 in | Wt 169.0 lb

## 2023-02-16 DIAGNOSIS — M21961 Unspecified acquired deformity of right lower leg: Secondary | ICD-10-CM | POA: Diagnosis not present

## 2023-02-16 DIAGNOSIS — E1141 Type 2 diabetes mellitus with diabetic mononeuropathy: Secondary | ICD-10-CM

## 2023-02-16 DIAGNOSIS — M79671 Pain in right foot: Secondary | ICD-10-CM

## 2023-02-16 NOTE — Patient Instructions (Signed)
Turmeric to reduce inflammation--can be used in cooking or taken as a supplement.  Benefits of turmeric:  -Highly anti-inflammatory  -Increases antioxidants  -Improves memory, attention, brain disease  -Lowers risk of heart disease  -May help prevent cancer  -Decreases pain  -Alleviates depression  -Delays aging and decreases risk of chronic disease  -Consume with black pepper to increase absorption    Turmeric Milk Recipe:  1 cup milk  1 tsp turmeric  1 tsp cinnamon  1 tsp grated ginger (optional)  Black pepper (boosts the anti-inflammatory properties of turmeric).  1 tsp honey    Foods that may reduce pain: 1) Ginger (especially studied for arthritis)- reduce leukotriene production to decrease inflammation 2) Blueberries- high in phytonutrients that decrease inflammation 3) Salmon- marine omega-3s reduce joint swelling and pain 4) Pumpkin seeds- reduce inflammation 5) dark chocolate- reduces inflammation 6) turmeric- reduces inflammation 7) tart cherries - reduce pain and stiffness 8) extra virgin olive oil - its compound olecanthal helps to block prostaglandins  9) chili peppers- can be eaten or applied topically via capsaicin 10) mint- helpful for headache, muscle aches, joint pain, and itching 11) garlic- reduces inflammation  Link to further information on diet for chronic pain: [email or web address]

## 2023-02-16 NOTE — Progress Notes (Signed)
Subjective:    Patient ID: Caroline Simmons, female    DOB: 1956-01-07, 67 y.o.   MRN: [id number]  HPI Caroline Simmons is a 67 year old woman who presents to establish care for right foot pain.  1) Foot deformity:  -has been present since birth -right foot is smaller  2) Right foot pain and swelling -started 1.5 years ago -she received medication and it does not work -she was started on allopurinol and colchicine- she has stopped both of these medications  3) Neuropathy: -this is from her diabetes -she is taking amitriptyline  Pain Inventory Average Pain 10 Pain Right Now 10 My pain is intermittent, sharp, burning, stabbing, tingling, and aching  In the last 24 hours, has pain interfered with the following? General activity 9 Relation with others 9 Enjoyment of life 9 What TIME of day is your pain at its worst? morning  and night Sleep (in general) Fair  Pain is worse with: walking, sitting, standing, and some activites Pain improves with: rest and medication Relief from Meds:  good  walk without assistance ability to climb steps?  yes do you drive?  yes Do you have any goals in this area?  yes  disabled: date disabled unknown  numbness tingling trouble walking dizziness anxiety  Any changes since last visit?  no  Any changes since last visit?  no    Family History  Problem Relation Age of Onset   Diabetes Mother        type 2  not sure onset   Diabetes Other    Hypertension Other    Colon cancer Neg Hx    Colon polyps Neg Hx    Esophageal cancer Neg Hx    Stomach cancer Neg Hx    Rectal cancer Neg Hx    Social History   Socioeconomic History   Marital status: Divorced    Spouse name: Not on file   Number of children: 3   Years of education: 14   Highest education level: Not on file  Occupational History   Occupation: disabled  Tobacco Use   Smoking status: Former    Current packs/day: 1.00    Average packs/day: 1 pack/day for 3.0 years  (3.0 ttl pk-yrs)    Types: Cigarettes   Smokeless tobacco: Never   Tobacco comments:    quit 08/2003  Vaping Use   Vaping status: Never Used  Substance and Sexual Activity   Alcohol use: Not Currently   Drug use: No   Sexual activity: Not Currently  Other Topics Concern   Not on file  Social History Narrative   Lives alone in a 2 story home.  Has 3 children.  On disability.  Did work for [employer] for 29 years.  Education: some college.       Right handed        Are you currently employed ? No    What is your current occupation?disability   Do you live at home alone? yes   Who lives with you?    What type of home do you live in: 1 story or 2 story? two    Caffeine 1 cup    Social Determinants of Health   Financial Resource Strain: Medium Risk (01/07/2023)   Overall Financial Resource Strain (CARDIA)    Difficulty of Paying Living Expenses: Somewhat hard  Food Insecurity: Food Insecurity Present (01/14/2023)   Hunger Vital Sign    Worried About Running Out of Food in the Last Year:  Sometimes true    Ran Out of Food in the Last Year: Never true  Transportation Needs: No Transportation Needs (01/07/2023)   PRAPARE - Administrator, Civil Service (Medical): No    Lack of Transportation (Non-Medical): No  Physical Activity: Inactive (01/07/2023)   Exercise Vital Sign    Days of Exercise per Week: 0 days    Minutes of Exercise per Session: 0 min  Stress: Stress Concern Present (01/07/2023)   [employer] of Occupational Health - Occupational Stress Questionnaire    Feeling of Stress : To some extent  Social Connections: Socially Isolated (12/24/2020)   Social Connection and Isolation Panel [NHANES]    Frequency of Communication with Friends and Family: More than three times a week    Frequency of Social Gatherings with Friends and Family: More than three times a week    Attends Religious Services: Never    Database administrator or Organizations: No     Attends Banker Meetings: Never    Marital Status: Divorced   Past Surgical History:  Procedure Laterality Date   CATARACT EXTRACTION, BILATERAL     CESAREAN SECTION     1982/1991   COLONOSCOPY  2012   [facility]-F/V-movi(exc)-HPP   POLYPECTOMY  2012   HPP   RETINAL DETACHMENT REPAIR W/ SCLERAL BUCKLE LE  11/12/2011   WISDOM TOOTH EXTRACTION     Past Medical History:  Diagnosis Date   Alopecia    Anxiety    on meds   Arthritis    back   Cataract 2011   bilateral removal   Chronic kidney disease (CKD), stage III (moderate) (HCC)    Depression    on meds   Essential hypertension, benign    on meds   GERD (gastroesophageal reflux disease)    on meds   Hyperlipidemia    on meds   Hypothyroid    on meds   Insomnia, unspecified    Menopause    lmp 06/2007   Neuropathy    per pt   Type II or unspecified type diabetes mellitus without mention of complication, not stated as uncontrolled    on meds   Vitamin D deficiency    There were no vitals taken for this visit.  Opioid Risk Score:   Fall Risk Score:  `1  Depression screen PHQ 2/9     01/07/2023    1:49 PM 12/30/2021    1:50 PM 12/24/2020    1:06 PM 12/02/2020   11:28 AM 08/24/2019    3:00 PM 08/19/2018    2:15 PM 05/27/2016    2:23 PM  Depression screen PHQ 2/9  Decreased Interest 0 0 0 0 0 0 1  Down, Depressed, Hopeless 0 1 1 0 0 0 2  PHQ - 2 Score 0 1 1 0 0 0 3  Altered sleeping       1  Tired, decreased energy       2  Change in appetite       1  Feeling bad or failure about yourself        1  Trouble concentrating       0  Moving slowly or fidgety/restless       3  Suicidal thoughts       0  PHQ-9 Score       11  Difficult doing work/chores       Somewhat difficult    Review of Systems  Musculoskeletal:  Positive for  gait problem.  Neurological:  Positive for dizziness and numbness.       Tingling  Psychiatric/Behavioral:         Anxiety  All other systems reviewed and are  negative.      Objective:   Physical Exam Gen: no distress, normal appearing HEENT: oral mucosa pink and moist, NCAT Cardio: Reg rate Chest: normal effort, normal rate of breathing Abd: soft, non-distended Ext: no edema Psych: pleasant, normal affect Skin: intact Neuro: Alert and oriented x3 Right foot deformity, smaller size     Assessment & Plan:  1) Chronic Pain Syndrome secondary to neuropathy 2/2 diabetes  -Discussed Qutenza as an option for neuropathic pain control. Discussed that this is a capsaicin patch, stronger than capsaicin cream. Discussed that it is currently approved for diabetic peripheral neuropathy and post-herpetic neuralgia, but that it has also shown benefit in treating other forms of neuropathy. Provided patient with link to site to learn more about the patch: https://www.clark.biz/. Discussed that the patch would be placed in office and benefits usually last 3 months. Discussed that unintended exposure to capsaicin can cause severe irritation of eyes, mucous membranes, respiratory tract, and skin, but that Qutenza is a local treatment and does not have the systemic side effects of other nerve medications. Discussed that there may be pain, itching, erythema, and decreased sensory function associated with the application of Qutenza. Side effects usually subside within 1 week. A cold pack of analgesic medications can help with these side effects. Blood pressure can also be increased due to pain associated with administration of the patch.   Turmeric to reduce inflammation--can be used in cooking or taken as a supplement.  Benefits of turmeric:  -Highly anti-inflammatory  -Increases antioxidants  -Improves memory, attention, brain disease  -Lowers risk of heart disease  -May help prevent cancer  -Decreases pain  -Alleviates depression  -Delays aging and decreases risk of chronic disease  -Consume with black pepper to increase absorption    Turmeric  Milk Recipe:  1 cup milk  1 tsp turmeric  1 tsp cinnamon  1 tsp grated ginger (optional)  Black pepper (boosts the anti-inflammatory properties of turmeric).  1 tsp honey  -Discussed current symptoms of pain and history of pain.  -Discussed benefits of exercise in reducing pain. -Discussed following foods that may reduce pain: 1) Ginger (especially studied for arthritis)- reduce leukotriene production to decrease inflammation 2) Blueberries- high in phytonutrients that decrease inflammation 3) Salmon- marine omega-3s reduce joint swelling and pain 4) Pumpkin seeds- reduce inflammation 5) dark chocolate- reduces inflammation 6) turmeric- reduces inflammation 7) tart cherries - reduce pain and stiffness 8) extra virgin olive oil - its compound olecanthal helps to block prostaglandins  9) chili peppers- can be eaten or applied topically via capsaicin 10) mint- helpful for headache, muscle aches, joint pain, and itching 11) garlic- reduces inflammation  Link to further information on diet for chronic pain: http://www.bray.com/     2) Right Foot deformity and pain: -discussed that right foot is smaller  -discussed orthotic and she defers at this time.

## 2023-03-03 ENCOUNTER — Other Ambulatory Visit: Payer: Self-pay | Admitting: Physical Medicine and Rehabilitation

## 2023-03-03 ENCOUNTER — Telehealth: Payer: Self-pay

## 2023-03-03 MED ORDER — LIDOCAINE 5 % EX PTCH: 1.0000 | MEDICATED_PATCH | CUTANEOUS | 0 refills | Status: DC

## 2023-03-03 NOTE — Telephone Encounter (Signed)
Patient has been notified

## 2023-03-03 NOTE — Telephone Encounter (Signed)
Patient called asking about patches for her ankle. The only thing I see in the notes if Qutenza patches at next visit but she states next available is not til March. She needs something now for the pain.

## 2023-03-04 ENCOUNTER — Ambulatory Visit: Payer: Medicare HMO | Admitting: Physical Medicine and Rehabilitation

## 2023-03-15 DIAGNOSIS — E119 Type 2 diabetes mellitus without complications: Secondary | ICD-10-CM | POA: Diagnosis not present

## 2023-03-15 LAB — HM DIABETES EYE EXAM

## 2023-03-30 ENCOUNTER — Encounter: Payer: Medicare HMO | Attending: Physical Medicine and Rehabilitation | Admitting: Physical Medicine and Rehabilitation

## 2023-03-30 VITALS — BP 144/92 | HR 84 | Ht 63.0 in | Wt 169.0 lb

## 2023-03-30 DIAGNOSIS — I10 Essential (primary) hypertension: Secondary | ICD-10-CM | POA: Diagnosis not present

## 2023-03-30 DIAGNOSIS — G8929 Other chronic pain: Secondary | ICD-10-CM | POA: Diagnosis not present

## 2023-03-30 DIAGNOSIS — Z794 Long term (current) use of insulin: Secondary | ICD-10-CM | POA: Diagnosis not present

## 2023-03-30 DIAGNOSIS — E1141 Type 2 diabetes mellitus with diabetic mononeuropathy: Secondary | ICD-10-CM | POA: Insufficient documentation

## 2023-03-30 DIAGNOSIS — M79671 Pain in right foot: Secondary | ICD-10-CM | POA: Insufficient documentation

## 2023-03-30 DIAGNOSIS — M25571 Pain in right ankle and joints of right foot: Secondary | ICD-10-CM | POA: Insufficient documentation

## 2023-03-30 MED ORDER — BACLOFEN 5 MG PO TABS: 5.0000 mg | ORAL_TABLET | Freq: Every evening | ORAL | 3 refills | Status: DC

## 2023-03-30 MED ORDER — CAPSAICIN-CLEANSING GEL 8 % EX KIT
4.0000 | PACK | Freq: Once | CUTANEOUS | Status: AC
Start: 2023-03-30 — End: 2023-03-30
  Administered 2023-03-30: 4 via TOPICAL

## 2023-03-30 NOTE — Progress Notes (Signed)
Subjective:    Patient ID: Caroline Simmons, female    DOB: [birth year], 67 y.o.   MRN: [id number]  HPI Caroline Simmons is a 67 year old woman who presents to establish care for right foot pain.  1) Foot deformity:  -has been present since birth -right foot is smaller  2) Right foot pain and swelling -started 2 years ago -she received medication and it does not work -she was started on allopurinol and colchicine- she has stopped both of these medications  3) Neuropathy: -this is from her diabetes -she is taking amitriptyline -pain is 10/10 -she sleeps poorly as a result  Pain Inventory Average Pain 10 Pain Right Now 10 My pain is intermittent, sharp, burning, stabbing, tingling, and aching  In the last 24 hours, has pain interfered with the following? General activity 9 Relation with others 9 Enjoyment of life 9 What TIME of day is your pain at its worst? morning  and night Sleep (in general) Fair  Pain is worse with: walking, sitting, standing, and some activites Pain improves with: rest and medication Relief from Meds:  good  walk without assistance ability to climb steps?  yes do you drive?  yes Do you have any goals in this area?  yes  disabled: date disabled unknown  numbness tingling trouble walking dizziness anxiety  Any changes since last visit?  no  Any changes since last visit?  no    Family History  Problem Relation Age of Onset   Diabetes Mother        type 2  not sure onset   Diabetes Other    Hypertension Other    Colon cancer Neg Hx    Colon polyps Neg Hx    Esophageal cancer Neg Hx    Stomach cancer Neg Hx    Rectal cancer Neg Hx    Social History   Socioeconomic History   Marital status: Divorced    Spouse name: Not on file   Number of children: 3   Years of education: 14   Highest education level: Not on file  Occupational History   Occupation: disabled  Tobacco Use   Smoking status: Former    Current packs/day: 1.00     Average packs/day: 1 pack/day for 3.0 years (3.0 ttl pk-yrs)    Types: Cigarettes   Smokeless tobacco: Never   Tobacco comments:    quit 08/2003  Vaping Use   Vaping status: Never Used  Substance and Sexual Activity   Alcohol use: Not Currently    Comment: rare   Drug use: No   Sexual activity: Not Currently  Other Topics Concern   Not on file  Social History Narrative   Lives alone in a 2 story home.  Has 3 children.  On disability.  Did work for [employer] for 29 years.  Education: some college.       Right handed        Are you currently employed ? No    What is your current occupation?disability   Do you live at home alone? yes   Who lives with you?    What type of home do you live in: 1 story or 2 story? two    Caffeine 1 cup    Social Determinants of Health   Financial Resource Strain: Medium Risk (01/07/2023)   Overall Financial Resource Strain (CARDIA)    Difficulty of Paying Living Expenses: Somewhat hard  Food Insecurity: Food Insecurity Present (01/14/2023)   Hunger Vital  Sign    Worried About Programme researcher, broadcasting/film/video in the Last Year: Sometimes true    Ran Out of Food in the Last Year: Never true  Transportation Needs: No Transportation Needs (01/07/2023)   PRAPARE - Administrator, Civil Service (Medical): No    Lack of Transportation (Non-Medical): No  Physical Activity: Inactive (01/07/2023)   Exercise Vital Sign    Days of Exercise per Week: 0 days    Minutes of Exercise per Session: 0 min  Stress: Stress Concern Present (01/07/2023)   [employer] of Occupational Health - Occupational Stress Questionnaire    Feeling of Stress : To some extent  Social Connections: Socially Isolated (12/24/2020)   Social Connection and Isolation Panel [NHANES]    Frequency of Communication with Friends and Family: More than three times a week    Frequency of Social Gatherings with Friends and Family: More than three times a week    Attends Religious Services:  Never    Database administrator or Organizations: No    Attends Banker Meetings: Never    Marital Status: Divorced   Past Surgical History:  Procedure Laterality Date   CATARACT EXTRACTION, BILATERAL     CESAREAN SECTION     1982/1991   COLONOSCOPY  2012   [facility]-F/V-movi(exc)-HPP   POLYPECTOMY  2012   HPP   RETINAL DETACHMENT REPAIR W/ SCLERAL BUCKLE LE  11/12/2011   WISDOM TOOTH EXTRACTION     Past Medical History:  Diagnosis Date   Alopecia    Anxiety    on meds   Arthritis    back   Cataract 2011   bilateral removal   Chronic kidney disease (CKD), stage III (moderate) (HCC)    Depression    on meds   Essential hypertension, benign    on meds   GERD (gastroesophageal reflux disease)    on meds   Hyperlipidemia    on meds   Hypothyroid    on meds   Insomnia, unspecified    Menopause    lmp 06/2007   Neuropathy    per pt   Type II or unspecified type diabetes mellitus without mention of complication, not stated as uncontrolled    on meds   Vitamin D deficiency    There were no vitals taken for this visit.  Opioid Risk Score:   Fall Risk Score:  `1  Depression screen PHQ 2/9     02/16/2023    1:30 PM 01/07/2023    1:49 PM 12/30/2021    1:50 PM 12/24/2020    1:06 PM 12/02/2020   11:28 AM 08/24/2019    3:00 PM 08/19/2018    2:15 PM  Depression screen PHQ 2/9  Decreased Interest 1 0 0 0 0 0 0  Down, Depressed, Hopeless 2 0 1 1 0 0 0  PHQ - 2 Score 3 0 1 1 0 0 0  Altered sleeping 3        Tired, decreased energy 2        Change in appetite 0        Feeling bad or failure about yourself  1        Trouble concentrating 1        Moving slowly or fidgety/restless 1        Suicidal thoughts 0        PHQ-9 Score 11          Review of Systems  Musculoskeletal:  Positive for gait problem.  Neurological:  Positive for dizziness and numbness.       Tingling  Psychiatric/Behavioral:         Anxiety  All other systems reviewed and are  negative.      Objective:   Physical Exam Gen: no distress, normal appearing HEENT: oral mucosa pink and moist, NCAT Cardio: Reg rate Chest: normal effort, normal rate of breathing Abd: soft, non-distended Ext: no edema Psych: pleasant, normal affect Skin: intact Neuro: Alert and oriented x3 Right foot deformity, smaller size, +toe curling     Assessment & Plan:  1) Chronic Pain Syndrome secondary to neuropathy 2/2 diabetes  -Discussed Qutenza as an option for neuropathic pain control. Discussed that this is a capsaicin patch, stronger than capsaicin cream. Discussed that it is currently approved for diabetic peripheral neuropathy and post-herpetic neuralgia, but that it has also shown benefit in treating other forms of neuropathy. Provided patient with link to site to learn more about the patch: https://www.clark.biz/. Discussed that the patch would be placed in office and benefits usually last 3 months. Discussed that unintended exposure to capsaicin can cause severe irritation of eyes, mucous membranes, respiratory tract, and skin, but that Qutenza is a local treatment and does not have the systemic side effects of other nerve medications. Discussed that there may be pain, itching, erythema, and decreased sensory function associated with the application of Qutenza. Side effects usually subside within 1 week. A cold pack of analgesic medications can help with these side effects. Blood pressure can also be increased due to pain associated with administration of the patch.   4 patches of Qutenza was applied to the area of pain. Ice packs were applied during the procedure to ensure patient comfort. Blood pressure was monitored every 15 minutes. The patient tolerated the procedure well. Post-procedure instructions were given and follow-up has been scheduled.   -Provided with a pain relief journal and discussed that it contains foods and lifestyle tips to naturally help to improve pain.  Discussed that these lifestyle strategies are also very good for health unlike some medications which can have negative side effects. Discussed that the act of keeping a journal can be therapeutic and helpful to realize patterns what helps to trigger and alleviate pain.      Turmeric to reduce inflammation--can be used in cooking or taken as a supplement.  Benefits of turmeric:  -Highly anti-inflammatory  -Increases antioxidants  -Improves memory, attention, brain disease  -Lowers risk of heart disease  -May help prevent cancer  -Decreases pain  -Alleviates depression  -Delays aging and decreases risk of chronic disease  -Consume with black pepper to increase absorption    Turmeric Milk Recipe:  1 cup milk  1 tsp turmeric  1 tsp cinnamon  1 tsp grated ginger (optional)  Black pepper (boosts the anti-inflammatory properties of turmeric).  1 tsp honey  -Discussed current symptoms of pain and history of pain.  -Discussed benefits of exercise in reducing pain. -Discussed following foods that may reduce pain: 1) Ginger (especially studied for arthritis)- reduce leukotriene production to decrease inflammation 2) Blueberries- high in phytonutrients that decrease inflammation 3) Salmon- marine omega-3s reduce joint swelling and pain 4) Pumpkin seeds- reduce inflammation 5) dark chocolate- reduces inflammation 6) turmeric- reduces inflammation 7) tart cherries - reduce pain and stiffness 8) extra virgin olive oil - its compound olecanthal helps to block prostaglandins  9) chili peppers- can be eaten or applied topically via capsaicin 10) mint- helpful for headache,  muscle aches, joint pain, and itching 11) garlic- reduces inflammation  Link to further information on diet for chronic pain: http://www.bray.com/     2) Right Foot deformity and pain: -discussed that right foot is smaller  -discussed  orthotic and she defers at this time.   3) Right foot spasticity/toe curling: -baclofen 5mg  prescribed at night  4) HTN -baclofen 5mg  prescribed HS -encouraged eating nuts and fruits  5) Right ankle pain: -MRI ankle ordered

## 2023-04-07 DIAGNOSIS — H26492 Other secondary cataract, left eye: Secondary | ICD-10-CM | POA: Diagnosis not present

## 2023-04-07 DIAGNOSIS — H25811 Combined forms of age-related cataract, right eye: Secondary | ICD-10-CM | POA: Diagnosis not present

## 2023-04-12 DIAGNOSIS — N1832 Chronic kidney disease, stage 3b: Secondary | ICD-10-CM | POA: Diagnosis not present

## 2023-04-20 DIAGNOSIS — N1832 Chronic kidney disease, stage 3b: Secondary | ICD-10-CM | POA: Diagnosis not present

## 2023-04-20 DIAGNOSIS — E872 Acidosis, unspecified: Secondary | ICD-10-CM | POA: Diagnosis not present

## 2023-04-20 DIAGNOSIS — E1122 Type 2 diabetes mellitus with diabetic chronic kidney disease: Secondary | ICD-10-CM | POA: Diagnosis not present

## 2023-04-20 DIAGNOSIS — I129 Hypertensive chronic kidney disease with stage 1 through stage 4 chronic kidney disease, or unspecified chronic kidney disease: Secondary | ICD-10-CM | POA: Diagnosis not present

## 2023-04-23 DIAGNOSIS — H25811 Combined forms of age-related cataract, right eye: Secondary | ICD-10-CM | POA: Diagnosis not present

## 2023-04-23 DIAGNOSIS — H2513 Age-related nuclear cataract, bilateral: Secondary | ICD-10-CM | POA: Diagnosis not present

## 2023-05-02 ENCOUNTER — Other Ambulatory Visit: Payer: Medicare HMO

## 2023-05-21 DIAGNOSIS — Z0100 Encounter for examination of eyes and vision without abnormal findings: Secondary | ICD-10-CM | POA: Diagnosis not present

## 2023-05-23 ENCOUNTER — Ambulatory Visit
Admission: RE | Admit: 2023-05-23 | Discharge: 2023-05-23 | Disposition: A | Discharge status: Home / Self Care | Payer: Medicare HMO | Source: Ambulatory Visit | Attending: Physical Medicine and Rehabilitation | Admitting: Physical Medicine and Rehabilitation

## 2023-05-23 DIAGNOSIS — R600 Localized edema: Secondary | ICD-10-CM | POA: Diagnosis not present

## 2023-05-23 DIAGNOSIS — M25571 Pain in right ankle and joints of right foot: Secondary | ICD-10-CM | POA: Diagnosis not present

## 2023-05-23 DIAGNOSIS — M79671 Pain in right foot: Secondary | ICD-10-CM

## 2023-05-27 ENCOUNTER — Other Ambulatory Visit: Payer: Self-pay | Admitting: Family Medicine

## 2023-05-27 DIAGNOSIS — Z0100 Encounter for examination of eyes and vision without abnormal findings: Secondary | ICD-10-CM | POA: Diagnosis not present

## 2023-05-27 DIAGNOSIS — I10 Essential (primary) hypertension: Secondary | ICD-10-CM

## 2023-05-27 DIAGNOSIS — E034 Atrophy of thyroid (acquired): Secondary | ICD-10-CM

## 2023-06-04 ENCOUNTER — Telehealth: Payer: Self-pay

## 2023-06-04 NOTE — Telephone Encounter (Signed)
Caroline Simmons is calling for her MRI results.  Call back phone [phone].

## 2023-06-04 NOTE — Telephone Encounter (Signed)
Patient informed. 

## 2023-06-05 NOTE — Progress Notes (Unsigned)
[facility] [address] [place], [place] [address] [phone]  Date:  06/07/2023   Name:  Caroline Simmons   DOB:  [birth year]   MRN:  [id number]  PCP:  Pearline Cables, MD    Chief Complaint: No chief complaint on file.   History of Present Illness:  Caroline Simmons is a 67 y.o. very pleasant female patient who presents with the following:  Patient seen today for periodic follow-up visit Most recent visit with myself was in June History of diet controlled diabetes with neuropathy, hypertension, hyperlipidemia, chronic kidney disease, hypothyroidism  She is under nephrology care with Dr. Elenore Rota with [facility] IIIb chronic kidney disease  Foot exam Flu shot COVID booster recommended A1c  Lab Results  Component Value Date   HGBA1C 7.3 (H) 12/03/2022   Amitriptyline 25 Aspirin 81 Baclofen 5 Tegretol 200 twice daily HCTZ 12.5 Lisinopril 20 Crestor 20  Patient Active Problem List   Diagnosis Date Noted   Acute gout due to renal impairment involving right ankle 03/23/2022   Peroneal tenosynovitis 12/17/2021   Arthrosis of left midfoot 10/27/2021   Stress fracture of calcaneus 09/02/2021   Pes cavus of both feet 09/02/2021   Abnormal urinalysis 09/14/2019   Vitamin D deficiency 08/10/2017   Secondary hyperparathyroidism of renal origin (HCC) 07/30/2016   Chronic kidney disease, stage 3 (HCC) 04/08/2016   Dyslipidemia 04/02/2016   Proteinuria 04/02/2016   Fatigue 12/25/2013   Neuropathy 12/25/2013   Dyslipidemia associated with type 2 diabetes mellitus (HCC) 05/30/2013   Detached retina, left 11/25/2011   Depression with anxiety 11/25/2011   Neuropathy in diabetes (HCC) 09/15/2011   Insomnia, unspecified    Hypothyroidism    DIABETES MELLITUS, TYPE II 08/26/2010   Essential hypertension 08/26/2010   RECTAL PAIN 08/26/2010   Peripheral motor neuropathy 06/22/2009    Past Medical History:   Diagnosis Date   Alopecia    Anxiety    on meds   Arthritis    back   Cataract 2011   bilateral removal   Chronic kidney disease (CKD), stage III (moderate) (HCC)    Depression    on meds   Essential hypertension, benign    on meds   GERD (gastroesophageal reflux disease)    on meds   Hyperlipidemia    on meds   Hypothyroid    on meds   Insomnia, unspecified    Menopause    lmp 06/2007   Neuropathy    per pt   Type II or unspecified type diabetes mellitus without mention of complication, not stated as uncontrolled    on meds   Vitamin D deficiency     Past Surgical History:  Procedure Laterality Date   CATARACT EXTRACTION, BILATERAL     CESAREAN SECTION     1982/1991   COLONOSCOPY  2012   [facility]-F/V-movi(exc)-HPP   POLYPECTOMY  2012   HPP   RETINAL DETACHMENT REPAIR W/ SCLERAL BUCKLE LE  11/12/2011   WISDOM TOOTH EXTRACTION      Social History   Tobacco Use   Smoking status: Former    Current packs/day: 1.00    Average packs/day: 1 pack/day for 3.0 years (3.0 ttl pk-yrs)    Types: Cigarettes   Smokeless tobacco: Never   Tobacco comments:    quit 08/2003  Vaping Use   Vaping status: Never Used  Substance Use Topics   Alcohol use: Not Currently    Comment: rare  Drug use: No    Family History  Problem Relation Age of Onset   Diabetes Mother        type 2  not sure onset   Diabetes Other    Hypertension Other    Colon cancer Neg Hx    Colon polyps Neg Hx    Esophageal cancer Neg Hx    Stomach cancer Neg Hx    Rectal cancer Neg Hx     Allergies  Allergen Reactions   Zoloft  [Sertraline Hcl]    Codeine Nausea Only and Other (See Comments)    Pt states she has taken this with no problems.  Other Reaction(s): GI Intolerance   Lamotrigine Rash    Other reaction(s): RASH    Medication list has been reviewed and updated.  Current Outpatient Medications on File Prior to Visit  Medication Sig Dispense Refill   allopurinol (ZYLOPRIM)  100 MG tablet TAKE 1 TABLET BY MOUTH EVERY DAY (Patient not taking: Reported on 02/16/2023) 90 tablet 1   amitriptyline (ELAVIL) 100 MG tablet TAKE 1 TABLET BY MOUTH EVERYDAY AT BEDTIME 90 tablet 3   amitriptyline (ELAVIL) 25 MG tablet Take by mouth.     aspirin 81 MG tablet Take 81 mg by mouth daily.     Baclofen 5 MG TABS Take 1 tablet (5 mg total) by mouth at bedtime. 90 tablet 3   Biotin 10 MG CAPS Take 1 tablet by mouth daily.     carbamazepine (TEGRETOL) 200 MG tablet Take 1 tablet (200 mg total) by mouth in the morning and at bedtime. 180 tablet 3   cholecalciferol (VITAMIN D3) 25 MCG (1000 UNIT) tablet Take by mouth.     colchicine 0.6 MG tablet Take 1 tablet (0.6 mg total) by mouth 2 (two) times daily. (Patient not taking: Reported on 02/16/2023) 60 tablet 2   [employer] Lancets Micro Thin 33G MISC APPLY IN THE MORNING AND AT BEDTIME. E11.42     finasteride (PROSCAR) 5 MG tablet      glucose blood (TRUE METRIX PRO BLOOD GLUCOSE) test strip Use as instructed 100 each 12   glucose blood test strip      hydrochlorothiazide (MICROZIDE) 12.5 MG capsule Take by mouth.     HYDROcodone-acetaminophen (NORCO/VICODIN) 5-325 MG tablet Take 1 tablet by mouth every 8 (eight) hours as needed. (Patient not taking: Reported on 02/16/2023) 8 tablet 0   Lancets 33G MISC 1 each by Does not apply route in the morning and at bedtime. E11.42 100 each 12   levothyroxine (SYNTHROID) 50 MCG tablet Take 1 tablet (50 mcg total) by mouth daily before breakfast. 90 tablet 0   lidocaine (LIDODERM) 5 % Place 1 patch onto the skin daily. Remove & Discard patch within 12 hours or as directed by MD 30 patch 0   lisinopril (ZESTRIL) 20 MG tablet Take 1 tablet (20 mg total) by mouth daily. 90 tablet 0   omeprazole (PRILOSEC) 20 MG capsule as needed.     rosuvastatin (CRESTOR) 20 MG tablet TAKE 1 TABLET EVERY DAY 90 tablet 1   SHINGRIX injection      sodium bicarbonate 650 MG tablet Take 650 mg by mouth 2 (two) times daily.     No  current facility-administered medications on file prior to visit.    Review of Systems:  As per HPI- otherwise negative.   Physical Examination: There were no vitals filed for this visit. There were no vitals filed for this visit. There is no height or  weight on file to calculate BMI. Ideal Body Weight:    GEN: no acute distress. HEENT: Atraumatic, Normocephalic.  Ears and Nose: No external deformity. CV: RRR, No M/G/R. No JVD. No thrill. No extra heart sounds. PULM: CTA B, no wheezes, crackles, rhonchi. No retractions. No resp. distress. No accessory muscle use. ABD: S, NT, ND, +BS. No rebound. No HSM. EXTR: No c/c/e PSYCH: Normally interactive. Conversant.    Assessment and Plan: ***  Signed Abbe Amsterdam, MD

## 2023-06-05 NOTE — Patient Instructions (Incomplete)
It was good to see you today, I will be in touch with your labs.  Assuming all is well please see me in about 6 months  I refilled your Crestor so you can start back on it for cholesterol control.  I also sent in a prescription for Jardiance 10 mg, this should help keep your kidneys healthy and control your blood sugar  You got your flu shot today Recommend COVID-19 booster if not up-to-date

## 2023-06-07 ENCOUNTER — Ambulatory Visit: Payer: Medicare HMO | Admitting: Family Medicine

## 2023-06-07 ENCOUNTER — Ambulatory Visit (INDEPENDENT_AMBULATORY_CARE_PROVIDER_SITE_OTHER): Payer: Medicare HMO | Admitting: Family Medicine

## 2023-06-07 VITALS — BP 124/80 | HR 82 | Temp 98.2°F | Resp 18 | Ht 63.0 in | Wt 167.0 lb

## 2023-06-07 DIAGNOSIS — E1169 Type 2 diabetes mellitus with other specified complication: Secondary | ICD-10-CM | POA: Diagnosis not present

## 2023-06-07 DIAGNOSIS — Z23 Encounter for immunization: Secondary | ICD-10-CM | POA: Diagnosis not present

## 2023-06-07 DIAGNOSIS — E034 Atrophy of thyroid (acquired): Secondary | ICD-10-CM

## 2023-06-07 DIAGNOSIS — E785 Hyperlipidemia, unspecified: Secondary | ICD-10-CM | POA: Diagnosis not present

## 2023-06-07 DIAGNOSIS — I10 Essential (primary) hypertension: Secondary | ICD-10-CM | POA: Diagnosis not present

## 2023-06-07 DIAGNOSIS — E1142 Type 2 diabetes mellitus with diabetic polyneuropathy: Secondary | ICD-10-CM

## 2023-06-07 MED ORDER — EMPAGLIFLOZIN 10 MG PO TABS
10.0000 mg | ORAL_TABLET | Freq: Every day | ORAL | 3 refills | Status: DC
Start: 2023-06-07 — End: 2023-10-20

## 2023-06-07 MED ORDER — ROSUVASTATIN CALCIUM 20 MG PO TABS: 20.0000 mg | ORAL_TABLET | Freq: Every day | ORAL | 3 refills | Status: DC

## 2023-06-08 LAB — LIPID PANEL
Cholesterol: 261 mg/dL — ABNORMAL HIGH (ref 0–200)
HDL: 62.1 mg/dL (ref >39.00)
LDL Cholesterol: 156 mg/dL — ABNORMAL HIGH (ref 0–99)
NonHDL: 199.34
Total CHOL/HDL Ratio: 4
Triglycerides: 217 mg/dL — ABNORMAL HIGH (ref 0.0–149.0)
VLDL: 43.4 mg/dL — ABNORMAL HIGH (ref 0.0–40.0)

## 2023-06-08 LAB — COMPREHENSIVE METABOLIC PANEL
ALT: 18 U/L (ref 0–35)
AST: 22 U/L (ref 0–37)
Albumin: 4.4 g/dL (ref 3.5–5.2)
Alkaline Phosphatase: 60 U/L (ref 39–117)
BUN: 16 mg/dL (ref 6–23)
CO2: 25 meq/L (ref 19–32)
Calcium: 9.5 mg/dL (ref 8.4–10.5)
Chloride: 106 meq/L (ref 96–112)
Creatinine, Ser: 1.4 mg/dL — ABNORMAL HIGH (ref 0.40–1.20)
GFR: 39.14 mL/min — ABNORMAL LOW (ref >60.00)
Glucose, Bld: 117 mg/dL — ABNORMAL HIGH (ref 70–99)
Potassium: 4.5 meq/L (ref 3.5–5.1)
Sodium: 140 meq/L (ref 135–145)
Total Bilirubin: 0.3 mg/dL (ref 0.2–1.2)
Total Protein: 7 g/dL (ref 6.0–8.3)

## 2023-06-08 LAB — CBC
HCT: 39.3 % (ref 36.0–46.0)
Hemoglobin: 12.9 g/dL (ref 12.0–15.0)
MCHC: 32.7 g/dL (ref 30.0–36.0)
MCV: 93 fL (ref 78.0–100.0)
Platelets: 213 10*3/uL (ref 150.0–400.0)
RBC: 4.23 Mil/uL (ref 3.87–5.11)
RDW: 13.5 % (ref 11.5–15.5)
WBC: 7 10*3/uL (ref 4.0–10.5)

## 2023-06-08 LAB — HEMOGLOBIN A1C: Hgb A1c MFr Bld: 7.9 % — ABNORMAL HIGH (ref 4.6–6.5)

## 2023-06-08 LAB — TSH: TSH: 1 u[IU]/mL (ref 0.35–5.50)

## 2023-06-11 ENCOUNTER — Encounter: Payer: Medicare HMO | Attending: Physical Medicine and Rehabilitation | Admitting: Physical Medicine and Rehabilitation

## 2023-06-11 DIAGNOSIS — M79671 Pain in right foot: Secondary | ICD-10-CM | POA: Diagnosis not present

## 2023-06-14 NOTE — Progress Notes (Signed)
Subjective:    Patient ID: Caroline Simmons, female    DOB: 12-29-1955, 67 y.o.   MRN: [id number]  HPI An audio/video tele-health visit is felt to be the most appropriate encounter for this patient at this time. This is a follow up tele-visit via phone. The patient is at home. MD is at office. Prior to scheduling this appointment, our staff discussed the limitations of evaluation and management by telemedicine and the availability of in-person appointments. The patient expressed understanding and agreed to proceed.   Caroline Simmons is a 67 year old woman who presents to establish care for right foot pain.  1) Foot deformity:  -has been present since birth -right foot is smaller -discussed her MRI foot results  2) Right foot pain and swelling -started 2 years ago -she received medication and it does not work -she was started on allopurinol and colchicine- she has stopped both of these medications  3) Neuropathy: -this is from her diabetes -she is taking amitriptyline -pain is 10/10 -she sleeps poorly as a result  Pain Inventory Average Pain 10 Pain Right Now 10 My pain is intermittent, sharp, burning, stabbing, tingling, and aching  In the last 24 hours, has pain interfered with the following? General activity 9 Relation with others 9 Enjoyment of life 9 What TIME of day is your pain at its worst? morning  and night Sleep (in general) Fair  Pain is worse with: walking, sitting, standing, and some activites Pain improves with: rest and medication Relief from Meds:  good  walk without assistance ability to climb steps?  yes do you drive?  yes Do you have any goals in this area?  yes  disabled: date disabled unknown  numbness tingling trouble walking dizziness anxiety  Any changes since last visit?  no  Any changes since last visit?  no    Family History  Problem Relation Age of Onset   Diabetes Mother        type 2  not sure onset   Diabetes Other     Hypertension Other    Colon cancer Neg Hx    Colon polyps Neg Hx    Esophageal cancer Neg Hx    Stomach cancer Neg Hx    Rectal cancer Neg Hx    Social History   Socioeconomic History   Marital status: Divorced    Spouse name: Not on file   Number of children: 3   Years of education: 14   Highest education level: Not on file  Occupational History   Occupation: disabled  Tobacco Use   Smoking status: Former    Current packs/day: 1.00    Average packs/day: 1 pack/day for 3.0 years (3.0 ttl pk-yrs)    Types: Cigarettes   Smokeless tobacco: Never   Tobacco comments:    quit 08/2003  Vaping Use   Vaping status: Never Used  Substance and Sexual Activity   Alcohol use: Not Currently    Comment: rare   Drug use: No   Sexual activity: Not Currently  Other Topics Concern   Not on file  Social History Narrative   Lives alone in a 2 story home.  Has 3 children.  On disability.  Did work for [employer] for 29 years.  Education: some college.       Right handed        Are you currently employed ? No    What is your current occupation?disability   Do you live at home alone? yes  Who lives with you?    What type of home do you live in: 1 story or 2 story? two    Caffeine 1 cup    Social Drivers of Health   Financial Resource Strain: Medium Risk (01/07/2023)   Overall Financial Resource Strain (CARDIA)    Difficulty of Paying Living Expenses: Somewhat hard  Food Insecurity: Food Insecurity Present (01/14/2023)   Hunger Vital Sign    Worried About Running Out of Food in the Last Year: Sometimes true    Ran Out of Food in the Last Year: Never true  Transportation Needs: No Transportation Needs (01/07/2023)   PRAPARE - Administrator, Civil Service (Medical): No    Lack of Transportation (Non-Medical): No  Physical Activity: Inactive (01/07/2023)   Exercise Vital Sign    Days of Exercise per Week: 0 days    Minutes of Exercise per Session: 0 min  Stress:  Stress Concern Present (01/07/2023)   [employer] of Occupational Health - Occupational Stress Questionnaire    Feeling of Stress : To some extent  Social Connections: Socially Isolated (12/24/2020)   Social Connection and Isolation Panel [NHANES]    Frequency of Communication with Friends and Family: More than three times a week    Frequency of Social Gatherings with Friends and Family: More than three times a week    Attends Religious Services: Never    Database administrator or Organizations: No    Attends Banker Meetings: Never    Marital Status: Divorced   Past Surgical History:  Procedure Laterality Date   CATARACT EXTRACTION, BILATERAL     CESAREAN SECTION     1982/1991   COLONOSCOPY  2012   [facility]-F/V-movi(exc)-HPP   POLYPECTOMY  2012   HPP   RETINAL DETACHMENT REPAIR W/ SCLERAL BUCKLE LE  11/12/2011   WISDOM TOOTH EXTRACTION     Past Medical History:  Diagnosis Date   Alopecia    Anxiety    on meds   Arthritis    back   Cataract 2011   bilateral removal   Chronic kidney disease (CKD), stage III (moderate) (HCC)    Depression    on meds   Essential hypertension, benign    on meds   GERD (gastroesophageal reflux disease)    on meds   Hyperlipidemia    on meds   Hypothyroid    on meds   Insomnia, unspecified    Menopause    lmp 06/2007   Neuropathy    per pt   Type II or unspecified type diabetes mellitus without mention of complication, not stated as uncontrolled    on meds   Vitamin D deficiency    There were no vitals taken for this visit.  Opioid Risk Score:   Fall Risk Score:  `1  Depression screen PHQ 2/9     02/16/2023    1:30 PM 01/07/2023    1:49 PM 12/30/2021    1:50 PM 12/24/2020    1:06 PM 12/02/2020   11:28 AM 08/24/2019    3:00 PM 08/19/2018    2:15 PM  Depression screen PHQ 2/9  Decreased Interest 1 0 0 0 0 0 0  Down, Depressed, Hopeless 2 0 1 1 0 0 0  PHQ - 2 Score 3 0 1 1 0 0 0  Altered sleeping 3         Tired, decreased energy 2        Change in appetite 0  Feeling bad or failure about yourself  1        Trouble concentrating 1        Moving slowly or fidgety/restless 1        Suicidal thoughts 0        PHQ-9 Score 11          Review of Systems  Musculoskeletal:  Positive for gait problem.  Neurological:  Positive for dizziness and numbness.       Tingling  Psychiatric/Behavioral:         Anxiety  All other systems reviewed and are negative.      Objective:   Physical Exam PRIOR EXAM: Gen: no distress, normal appearing HEENT: oral mucosa pink and moist, NCAT Cardio: Reg rate Chest: normal effort, normal rate of breathing Abd: soft, non-distended Ext: no edema Psych: pleasant, normal affect Skin: intact Neuro: Alert and oriented x3 Right foot deformity, smaller size, +toe curling     Assessment & Plan:  1) Chronic Pain Syndrome secondary to neuropathy 2/2 diabetes  -Discussed Qutenza as an option for neuropathic pain control. Discussed that this is a capsaicin patch, stronger than capsaicin cream. Discussed that it is currently approved for diabetic peripheral neuropathy and post-herpetic neuralgia, but that it has also shown benefit in treating other forms of neuropathy. Provided patient with link to site to learn more about the patch: https://www.clark.biz/. Discussed that the patch would be placed in office and benefits usually last 3 months. Discussed that unintended exposure to capsaicin can cause severe irritation of eyes, mucous membranes, respiratory tract, and skin, but that Qutenza is a local treatment and does not have the systemic side effects of other nerve medications. Discussed that there may be pain, itching, erythema, and decreased sensory function associated with the application of Qutenza. Side effects usually subside within 1 week. A cold pack of analgesic medications can help with these side effects. Blood pressure can also be increased due to pain  associated with administration of the patch.   4 patches of Qutenza was applied to the area of pain. Ice packs were applied during the procedure to ensure patient comfort. Blood pressure was monitored every 15 minutes. The patient tolerated the procedure well. Post-procedure instructions were given and follow-up has been scheduled.   -Provided with a pain relief journal and discussed that it contains foods and lifestyle tips to naturally help to improve pain. Discussed that these lifestyle strategies are also very good for health unlike some medications which can have negative side effects. Discussed that the act of keeping a journal can be therapeutic and helpful to realize patterns what helps to trigger and alleviate pain.      Turmeric to reduce inflammation--can be used in cooking or taken as a supplement.  Benefits of turmeric:  -Highly anti-inflammatory  -Increases antioxidants  -Improves memory, attention, brain disease  -Lowers risk of heart disease  -May help prevent cancer  -Decreases pain  -Alleviates depression  -Delays aging and decreases risk of chronic disease  -Consume with black pepper to increase absorption    Turmeric Milk Recipe:  1 cup milk  1 tsp turmeric  1 tsp cinnamon  1 tsp grated ginger (optional)  Black pepper (boosts the anti-inflammatory properties of turmeric).  1 tsp honey  -Discussed current symptoms of pain and history of pain.  -Discussed benefits of exercise in reducing pain. -Discussed following foods that may reduce pain: 1) Ginger (especially studied for arthritis)- reduce leukotriene production to decrease inflammation 2) Blueberries- high  in phytonutrients that decrease inflammation 3) Salmon- marine omega-3s reduce joint swelling and pain 4) Pumpkin seeds- reduce inflammation 5) dark chocolate- reduces inflammation 6) turmeric- reduces inflammation 7) tart cherries - reduce pain and stiffness 8) extra virgin olive oil -  its compound olecanthal helps to block prostaglandins  9) chili peppers- can be eaten or applied topically via capsaicin 10) mint- helpful for headache, muscle aches, joint pain, and itching 11) garlic- reduces inflammation  Link to further information on diet for chronic pain: http://www.bray.com/     2) Right Foot deformity and pain: -discussed that right foot is smaller  -discussed orthotic and she defers at this time.  -discussed that MRI results show tendinosis  Discussed extracorporeal shockwave therapy as a modality for treatment. Discussed that the device looks and feels like a massage gun and I would move it over the area of pain for about 10 minutes. The device releases sound waves to the area of pain and helps to improve blood flow and circulation to improve the healing process. Discuss that this initially induces inflammation and can sometimes cause short-term increase in pain. Discussed that we typically do three weekly treatments, but sometimes up to 6 if needed, and after 6 weeks long term benefits can sometimes be achieved. Discussed that this is an FDA approved device, but not covered by insurance and would cost $60 per session. Will scheduled patient for 6 consecutive appointments and can cancel latter three if benefits are achieved after first three sessions.    3) Right foot spasticity/toe curling: -baclofen 5mg  prescribed at night  4) HTN -baclofen 5mg  prescribed HS -encouraged eating nuts and fruits  5) Right ankle pain: -MRI results reviewed with him  5 minutes spent reviewing right foot MRI results, discussed tendinosis, discussed extracorporeal shockwave therapy as a modality for treatment. Discussed that the device looks and feels like a massage gun and I would move it over the area of pain for about 10 minutes.

## 2023-06-28 ENCOUNTER — Other Ambulatory Visit: Payer: Self-pay | Admitting: Family Medicine

## 2023-06-28 DIAGNOSIS — E1141 Type 2 diabetes mellitus with diabetic mononeuropathy: Secondary | ICD-10-CM

## 2023-07-05 ENCOUNTER — Ambulatory Visit: Payer: Medicare HMO | Admitting: Physical Medicine and Rehabilitation

## 2023-08-16 ENCOUNTER — Encounter: Payer: Medicare HMO | Attending: Physical Medicine and Rehabilitation | Admitting: Physical Medicine and Rehabilitation

## 2023-08-16 ENCOUNTER — Encounter: Payer: Self-pay | Admitting: Physical Medicine and Rehabilitation

## 2023-08-16 VITALS — BP 137/86 | HR 89 | Ht 63.0 in | Wt 166.0 lb

## 2023-08-16 DIAGNOSIS — E1141 Type 2 diabetes mellitus with diabetic mononeuropathy: Secondary | ICD-10-CM | POA: Insufficient documentation

## 2023-08-16 DIAGNOSIS — Z794 Long term (current) use of insulin: Secondary | ICD-10-CM | POA: Diagnosis not present

## 2023-08-16 DIAGNOSIS — E663 Overweight: Secondary | ICD-10-CM | POA: Insufficient documentation

## 2023-08-16 MED ORDER — CAPSAICIN-CLEANSING GEL 8 % EX KIT
4.0000 | PACK | Freq: Once | CUTANEOUS | Status: AC
Start: 2023-08-16 — End: 2023-08-16
  Administered 2023-08-16: 4 via TOPICAL

## 2023-08-16 NOTE — Progress Notes (Addendum)
 Subjective:    Patient ID: Caroline Simmons, female    DOB: 1955/09/25, 68 y.o.   MRN: [id number]  HPI   Mrs. Kettering is a 68 year old woman who presents to establish care for right foot pain.  1) Foot deformity:  -has been present since birth -right foot is smaller -discussed her MRI foot results  2) Right foot pain and swelling -started 2 years ago -she received medication and it does not work -she was started on allopurinol and colchicine- she has stopped both of these medications  3) Neuropathy: -this is from her diabetes -she is taking amitriptyline -pain is 10/10 -she sleeps poorly as a result -Qutenza improved pain by 50% -she would like to repeat patches again today -she has been consuming turmeric  4) Overweight: -she has been trying to lose weight -she has been adding turmeric to her coffee or food -she has been taking topamax and this also helps with her pain  Pain Inventory Average Pain 10 Pain Right Now 10 My pain is intermittent, sharp, burning, stabbing, tingling, and aching  In the last 24 hours, has pain interfered with the following? General activity 9 Relation with others 9 Enjoyment of life 9 What TIME of day is your pain at its worst? morning  and night Sleep (in general) Fair  Pain is worse with: walking, sitting, standing, and some activites Pain improves with: rest and medication Relief from Meds:  good  walk without assistance ability to climb steps?  yes do you drive?  yes Do you have any goals in this area?  yes  disabled: date disabled unknown  numbness tingling trouble walking dizziness anxiety  Any changes since last visit?  no  Any changes since last visit?  no    Family History  Problem Relation Age of Onset   Diabetes Mother        type 2  not sure onset   Diabetes Other    Hypertension Other    Colon cancer Neg Hx    Colon polyps Neg Hx    Esophageal cancer Neg Hx    Stomach cancer Neg Hx    Rectal cancer  Neg Hx    Social History   Socioeconomic History   Marital status: Divorced    Spouse name: Not on file   Number of children: 3   Years of education: 14   Highest education level: Not on file  Occupational History   Occupation: disabled  Tobacco Use   Smoking status: Former    Current packs/day: 1.00    Average packs/day: 1 pack/day for 3.0 years (3.0 ttl pk-yrs)    Types: Cigarettes   Smokeless tobacco: Never   Tobacco comments:    quit 08/2003  Vaping Use   Vaping status: Never Used  Substance and Sexual Activity   Alcohol use: Not Currently    Comment: rare   Drug use: No   Sexual activity: Not Currently  Other Topics Concern   Not on file  Social History Narrative   Lives alone in a 2 story home.  Has 3 children.  On disability.  Did work for [employer] for 29 years.  Education: some college.       Right handed        Are you currently employed ? No    What is your current occupation?disability   Do you live at home alone? yes   Who lives with you?    What type of home do you  live in: 1 story or 2 story? two    Caffeine 1 cup    Social Drivers of Health   Financial Resource Strain: Medium Risk (01/07/2023)   Overall Financial Resource Strain (CARDIA)    Difficulty of Paying Living Expenses: Somewhat hard  Food Insecurity: Food Insecurity Present (01/14/2023)   Hunger Vital Sign    Worried About Running Out of Food in the Last Year: Sometimes true    Ran Out of Food in the Last Year: Never true  Transportation Needs: No Transportation Needs (01/07/2023)   PRAPARE - Administrator, Civil Service (Medical): No    Lack of Transportation (Non-Medical): No  Physical Activity: Inactive (01/07/2023)   Exercise Vital Sign    Days of Exercise per Week: 0 days    Minutes of Exercise per Session: 0 min  Stress: Stress Concern Present (01/07/2023)   [employer] of Occupational Health - Occupational Stress Questionnaire    Feeling of Stress : To  some extent  Social Connections: Socially Isolated (12/24/2020)   Social Connection and Isolation Panel [NHANES]    Frequency of Communication with Friends and Family: More than three times a week    Frequency of Social Gatherings with Friends and Family: More than three times a week    Attends Religious Services: Never    Database administrator or Organizations: No    Attends Banker Meetings: Never    Marital Status: Divorced   Past Surgical History:  Procedure Laterality Date   CATARACT EXTRACTION, BILATERAL     CESAREAN SECTION     1982/1991   COLONOSCOPY  2012   [facility]-F/V-movi(exc)-HPP   POLYPECTOMY  2012   HPP   RETINAL DETACHMENT REPAIR W/ SCLERAL BUCKLE LE  11/12/2011   WISDOM TOOTH EXTRACTION     Past Medical History:  Diagnosis Date   Alopecia    Anxiety    on meds   Arthritis    back   Cataract 2011   bilateral removal   Chronic kidney disease (CKD), stage III (moderate) (HCC)    Depression    on meds   Essential hypertension, benign    on meds   GERD (gastroesophageal reflux disease)    on meds   Hyperlipidemia    on meds   Hypothyroid    on meds   Insomnia, unspecified    Menopause    lmp 06/2007   Neuropathy    per pt   Type II or unspecified type diabetes mellitus without mention of complication, not stated as uncontrolled    on meds   Vitamin D deficiency    BP 137/86   Pulse 89   Ht 5\' 3"  (1.6 m)   Wt 166 lb (75.3 kg)   SpO2 99%   BMI 29.41 kg/m   Opioid Risk Score:   Fall Risk Score:  `1  Depression screen PHQ 2/9     08/16/2023    2:38 PM 02/16/2023    1:30 PM 01/07/2023    1:49 PM 12/30/2021    1:50 PM 12/24/2020    1:06 PM 12/02/2020   11:28 AM 08/24/2019    3:00 PM  Depression screen PHQ 2/9  Decreased Interest 0 1 0 0 0 0 0  Down, Depressed, Hopeless 0 2 0 1 1 0 0  PHQ - 2 Score 0 3 0 1 1 0 0  Altered sleeping  3       Tired, decreased energy  2  Change in appetite  0       Feeling bad or failure about  yourself   1       Trouble concentrating  1       Moving slowly or fidgety/restless  1       Suicidal thoughts  0       PHQ-9 Score  11         Review of Systems  Musculoskeletal:  Positive for gait problem.  Neurological:  Positive for dizziness and numbness.       Tingling  Psychiatric/Behavioral:         Anxiety  All other systems reviewed and are negative.      Objective:   Physical Exam PRIOR EXAM: Gen: no distress, normal appearing HEENT: oral mucosa pink and moist, NCAT Cardio: Reg rate Chest: normal effort, normal rate of breathing Abd: soft, non-distended Ext: no edema Psych: pleasant, normal affect Skin: intact Neuro: Alert and oriented x3, decreased sensation of feet Right foot deformity, smaller size, +toe curling     Assessment & Plan:  1) Chronic Pain Syndrome secondary to neuropathy 2/2 diabetes -Discussed Qutenza as an option for neuropathic pain control. Discussed that this is a capsaicin patch, stronger than capsaicin cream. Discussed that it is currently approved for diabetic peripheral neuropathy and post-herpetic neuralgia, but that it has also shown benefit in treating other forms of neuropathy. Provided patient with link to site to learn more about the patch: https://www.clark.biz/. Discussed that the patch would be placed in office and benefits usually last 3 months. Discussed that unintended exposure to capsaicin can cause severe irritation of eyes, mucous membranes, respiratory tract, and skin, but that Qutenza is a local treatment and does not have the systemic side effects of other nerve medications. Discussed that there may be pain, itching, erythema, and decreased sensory function associated with the application of Qutenza. Side effects usually subside within 1 week. A cold pack of analgesic medications can help with these side effects. Blood pressure can also be increased due to pain associated with administration of the patch.   4 patches of  Qutenza was applied to the area of pain. Ice packs were applied during the procedure to ensure patient comfort. Blood pressure was monitored every 15 minutes. The patient tolerated the procedure well. Post-procedure instructions were given and follow-up has been scheduled.    -Provided with a pain relief journal and discussed that it contains foods and lifestyle tips to naturally help to improve pain. Discussed that these lifestyle strategies are also very good for health unlike some medications which can have negative side effects. Discussed that the act of keeping a journal can be therapeutic and helpful to realize patterns what helps to trigger and alleviate pain.      Turmeric to reduce inflammation--can be used in cooking or taken as a supplement.  Benefits of turmeric:  -Highly anti-inflammatory  -Increases antioxidants  -Improves memory, attention, brain disease  -Lowers risk of heart disease  -May help prevent cancer  -Decreases pain  -Alleviates depression  -Delays aging and decreases risk of chronic disease  -Consume with black pepper to increase absorption    Turmeric Milk Recipe:  1 cup milk  1 tsp turmeric  1 tsp cinnamon  1 tsp grated ginger (optional)  Black pepper (boosts the anti-inflammatory properties of turmeric).  1 tsp honey   -Discussed current symptoms of pain and history of pain.  -Discussed benefits of exercise in reducing pain. -Discussed following foods that may  reduce pain: 1) Ginger (especially studied for arthritis)- reduce leukotriene production to decrease inflammation 2) Blueberries- high in phytonutrients that decrease inflammation 3) Salmon- marine omega-3s reduce joint swelling and pain 4) Pumpkin seeds- reduce inflammation 5) dark chocolate- reduces inflammation 6) turmeric- reduces inflammation 7) tart cherries - reduce pain and stiffness 8) extra virgin olive oil - its compound olecanthal helps to block prostaglandins  9)  chili peppers- can be eaten or applied topically via capsaicin 10) mint- helpful for headache, muscle aches, joint pain, and itching 11) garlic- reduces inflammation  Link to further information on diet for chronic pain: http://www.bray.com/     2) Right Foot deformity and pain: -discussed that right foot is smaller  -discussed orthotic and she defers at this time.  -discussed that MRI results show tendinosis  Discussed extracorporeal shockwave therapy as a modality for treatment. Discussed that the device looks and feels like a massage gun and I would move it over the area of pain for about 10 minutes. The device releases sound waves to the area of pain and helps to improve blood flow and circulation to improve the healing process. Discuss that this initially induces inflammation and can sometimes cause short-term increase in pain. Discussed that we typically do three weekly treatments, but sometimes up to 6 if needed, and after 6 weeks long term benefits can sometimes be achieved. Discussed that this is an FDA approved device, but not covered by insurance and would cost $60 per session. Will scheduled patient for 6 consecutive appointments and can cancel latter three if benefits are achieved after first three sessions.    3) Right foot spasticity/toe curling: -baclofen 5mg  prescribed at night  4) HTN -baclofen 5mg  prescribed HS -encouraged eating nuts and fruits  5) Right ankle pain: -MRI results reviewed with her- discussed improvements compared to prior MRI -discussed that MRI shows continued tendinosis and improvement in edema  6) Overweight: -continue topamax -continue turmeric -recommended daily celery juice first thing in the morning

## 2023-08-24 ENCOUNTER — Other Ambulatory Visit: Payer: Self-pay | Admitting: Family Medicine

## 2023-08-24 ENCOUNTER — Other Ambulatory Visit: Payer: Self-pay | Admitting: Neurology

## 2023-08-24 DIAGNOSIS — E034 Atrophy of thyroid (acquired): Secondary | ICD-10-CM

## 2023-08-24 DIAGNOSIS — E1142 Type 2 diabetes mellitus with diabetic polyneuropathy: Secondary | ICD-10-CM

## 2023-08-24 DIAGNOSIS — I10 Essential (primary) hypertension: Secondary | ICD-10-CM

## 2023-08-31 ENCOUNTER — Ambulatory Visit: Payer: Medicare HMO | Admitting: Neurology

## 2023-09-09 ENCOUNTER — Telehealth: Payer: Self-pay

## 2023-09-09 NOTE — Telephone Encounter (Signed)
 Caroline Simmons out of pocket cost for Caroline Simmons is too much. Patient stated cannot pay the cost. Service was received in February.

## 2023-09-14 ENCOUNTER — Encounter: Payer: Self-pay | Admitting: Neurology

## 2023-09-14 ENCOUNTER — Ambulatory Visit: Payer: Medicare HMO | Admitting: Neurology

## 2023-09-14 VITALS — BP 142/86 | HR 80 | Ht 63.0 in | Wt 164.6 lb

## 2023-09-14 DIAGNOSIS — E1142 Type 2 diabetes mellitus with diabetic polyneuropathy: Secondary | ICD-10-CM | POA: Diagnosis not present

## 2023-09-14 DIAGNOSIS — R292 Abnormal reflex: Secondary | ICD-10-CM | POA: Diagnosis not present

## 2023-09-14 MED ORDER — CARBAMAZEPINE 200 MG PO TABS: 200.0000 mg | ORAL_TABLET | Freq: Two times a day (BID) | ORAL | 3 refills | Status: AC

## 2023-09-14 MED ORDER — AMITRIPTYLINE HCL 100 MG PO TABS
ORAL_TABLET | ORAL | 3 refills | Status: AC
Start: 2023-09-14 — End: ?

## 2023-09-14 NOTE — Progress Notes (Signed)
 Follow-up Visit   Date: 09/14/23    Caroline Simmons MRN: [id number] DOB: 04-16-1956   Interim History: Caroline Simmons is a 68 y.o. right-handed African American female with type 2 diabetes mellitus (HbA1c 7.9) complicated by stage III CKD and neuropathy, hypertension, and hypothyroidism returning to the clinic for follow-up of painful diabetic neuropathy.  The patient was accompanied to the clinic by self.   IMPRESSION/PLAN: Painful diabetic neuropathy, small fiber stable.    - Previously tried: Lyrica, gabapentin, Cymbalta, Lamictal, and Dilantin.    - Continue amitriptyline 100mg  at bedtime   - Continue carbamazepine 200mg  twice daily  - She is also followed by pain management  2.  Hyperreflexia in the legs may indicate lumbar canal stenosis. She has mild low back pain, no weakness.  MRI lumbar spine was offered, she declined testing.  PT declined, she prefers to do her own home exercises  Return to clinic in 1 year    ----------------------------------------------------------- History of present illness: Starting around 2008, she started having numbness, burning, and tingling of the feet. Her pain is above the level of the ankles and constant.  She denies any imbalance and walks unassisted.  She has mild tingling in the left hand, which is intermittent.  She has noticed greater difficulty with opening jars and holding onto objects.   She is trying very hard to keep her diabetes controlled with diet, last HbA1c 6.4.    She was followed by Dr. Jobe Marker at [facility].  Per  records, she had electrodiagnostic testing in March 2015 which did not show large fiber neuropathy.  She has previously tried Lyrica, gabapentin, cymbalta, lamictal, and dilantin.  She has the greatest benefit with amitriptyline 100 mg at bedtime and Tegretol 400 mg in the morning and 200 mg at bedtime.  She was on a higher dose of Tegretol in the past, however, due to her renal function and  cognitive side effects, but the dose was reduced. For severe pain, she takes hydrocodone 1-2 times per week.  She did not wish to see pain management in the past, when it was recommended.    UPDATE 08/22/2021:  She is here for follow-up visit.  She reports that her neuropathy has been stable and pain is well-controlled on carbamazepine and amitriptyline.  Sometimes, she only takes carbamazepine 200mg  twice daily (not 400mg  in the morning) and feels that pain is still controlled.  She is trying to walk more and balance is good.  She had two falls last year after, one from tripping over house plant. Last HbA1c 7.0.  UPDATE 08/31/2022:  She is here 1 year visit.  Neuropathy has been stable and despite reducing carbamazepine to 200mg  BID, pain is controlled.  She is also on amitriptyline 100mg  at bedtime and takes extra 25mg  as needed for severe pain.  She denies low back pain, leg weakness, or radicular leg pain.  Currently, she is having a lot of pain in the right ankle from gout.  She walks unassisted, no falls.  UPDATE /25/2025:  She is here for 1 year visit.  Neuropathy is stable and pain is well-controlled on carbamazepine 200mg  BID and amtriptyline 100mg  at bedtime.  She is also seeing pain management and tried Qutenza, but says it cost her $1200.  She denies any new complaints.  Medications:  Current Outpatient Medications on File Prior to Visit  Medication Sig Dispense Refill   allopurinol (ZYLOPRIM) 100 MG tablet TAKE 1 TABLET BY MOUTH EVERY DAY 90 tablet  1   amitriptyline (ELAVIL) 100 MG tablet TAKE 1 TABLET BY MOUTH EVERYDAY AT BEDTIME 90 tablet 3   amitriptyline (ELAVIL) 25 MG tablet TAKE 1 TABLET (25 MG TOTAL) BY MOUTH DAILY. 90 tablet 0   aspirin 81 MG tablet Take 81 mg by mouth daily.     Baclofen 5 MG TABS Take 1 tablet (5 mg total) by mouth at bedtime. 90 tablet 3   Biotin 10 MG CAPS Take 1 tablet by mouth daily.     carbamazepine (TEGRETOL) 200 MG tablet TAKE 1 TABLET (200 MG TOTAL) BY  MOUTH IN THE MORNING AND AT BEDTIME. 180 tablet 0   cholecalciferol (VITAMIN D3) 25 MCG (1000 UNIT) tablet Take by mouth.     colchicine 0.6 MG tablet Take 1 tablet (0.6 mg total) by mouth 2 (two) times daily. 60 tablet 2   [employer] Lancets Micro Thin 33G MISC APPLY IN THE MORNING AND AT BEDTIME. E11.42     empagliflozin (JARDIANCE) 10 MG TABS tablet Take 1 tablet (10 mg total) by mouth daily before breakfast. 30 tablet 3   finasteride (PROSCAR) 5 MG tablet      glucose blood (TRUE METRIX PRO BLOOD GLUCOSE) test strip Check blood sugars once daily 100 strip 12   hydrochlorothiazide (MICROZIDE) 12.5 MG capsule Take by mouth.     HYDROcodone-acetaminophen (NORCO/VICODIN) 5-325 MG tablet Take 1 tablet by mouth every 8 (eight) hours as needed. 8 tablet 0   Lancets 33G MISC 1 each by Does not apply route in the morning and at bedtime. E11.42 100 each 12   levothyroxine (SYNTHROID) 50 MCG tablet Take 1 tablet (50 mcg total) by mouth daily before breakfast. 90 tablet 1   lidocaine (LIDODERM) 5 % Place 1 patch onto the skin daily. Remove & Discard patch within 12 hours or as directed by MD 30 patch 0   lisinopril (ZESTRIL) 20 MG tablet Take 1 tablet (20 mg total) by mouth daily. 90 tablet 1   omeprazole (PRILOSEC) 20 MG capsule as needed.     rosuvastatin (CRESTOR) 20 MG tablet Take 1 tablet (20 mg total) by mouth daily. 90 tablet 3   sodium bicarbonate 650 MG tablet Take 650 mg by mouth 2 (two) times daily.     No current facility-administered medications on file prior to visit.    Allergies:  Allergies  Allergen Reactions   Zoloft  [Sertraline Hcl]    Codeine Nausea Only and Other (See Comments)    Pt states she has taken this with no problems.  Other Reaction(s): GI Intolerance   Lamotrigine Rash    Other reaction(s): RASH    Vital Signs:  There were no vitals taken for this visit.   Neurological Exam: MENTAL STATUS including orientation to time, place, person, recent and remote memory,  attention span and concentration, language, and fund of knowledge is normal.  Speech is not dysarthric.  MOTOR:  Motor strength is 5/5 in all extremities, except 5-/5 distal hand and toe weakness.  No atrophy, fasciculations or abnormal movements.  No pronator drift.  Tone is normal.    MSRs:                                           Right        Left brachioradialis 2+  2+  biceps 2+  2+  triceps 2+  2+  patellar 3+  3+  ankle jerk 1+  1+  Hoffman no  no  plantar response down  down   SENSORY:  Absent vibration distal to ankles bilaterally, normal at the knees and hands.   COORDINATION/GAIT:  Normal finger-to- nose-finger. Gait narrow based and stable, slow. Unassisted.   Data: Lab Results  Component Value Date   TSH 1.00 06/07/2023   Lab Results  Component Value Date   HGBA1C 7.9 (H) 06/07/2023     Thank you for allowing me to participate in patient's care.  If I can answer any additional questions, I would be pleased to do so.    Sincerely,    Labaron Digirolamo K. Allena Katz, DO

## 2023-10-20 ENCOUNTER — Other Ambulatory Visit: Payer: Self-pay | Admitting: Family Medicine

## 2023-10-20 DIAGNOSIS — E1142 Type 2 diabetes mellitus with diabetic polyneuropathy: Secondary | ICD-10-CM

## 2023-11-01 DIAGNOSIS — N1832 Chronic kidney disease, stage 3b: Secondary | ICD-10-CM | POA: Diagnosis not present

## 2023-11-03 DIAGNOSIS — I129 Hypertensive chronic kidney disease with stage 1 through stage 4 chronic kidney disease, or unspecified chronic kidney disease: Secondary | ICD-10-CM | POA: Diagnosis not present

## 2023-11-03 DIAGNOSIS — N1832 Chronic kidney disease, stage 3b: Secondary | ICD-10-CM | POA: Diagnosis not present

## 2023-11-03 DIAGNOSIS — N179 Acute kidney failure, unspecified: Secondary | ICD-10-CM | POA: Diagnosis not present

## 2023-11-03 DIAGNOSIS — E1122 Type 2 diabetes mellitus with diabetic chronic kidney disease: Secondary | ICD-10-CM | POA: Diagnosis not present

## 2023-11-16 ENCOUNTER — Encounter: Payer: Medicare HMO | Admitting: Physical Medicine and Rehabilitation

## 2023-11-17 DIAGNOSIS — N179 Acute kidney failure, unspecified: Secondary | ICD-10-CM | POA: Diagnosis not present

## 2023-11-29 ENCOUNTER — Encounter: Payer: Self-pay | Admitting: Physical Medicine and Rehabilitation

## 2023-11-29 ENCOUNTER — Encounter: Attending: Physical Medicine and Rehabilitation | Admitting: Physical Medicine and Rehabilitation

## 2023-11-29 VITALS — BP 127/85 | HR 83 | Ht 63.0 in | Wt 164.0 lb

## 2023-11-29 DIAGNOSIS — Z794 Long term (current) use of insulin: Secondary | ICD-10-CM

## 2023-11-29 DIAGNOSIS — E1141 Type 2 diabetes mellitus with diabetic mononeuropathy: Secondary | ICD-10-CM | POA: Diagnosis present

## 2023-11-29 NOTE — Progress Notes (Signed)
-  Discussed Qutenza  as an option for neuropathic pain control. Discussed that this is a capsaicin  patch, stronger than capsaicin  cream. Discussed that it is currently approved for diabetic peripheral neuropathy and post-herpetic neuralgia, but that it has also shown benefit in treating other forms of neuropathy. Provided patient with link to site to learn more about the patch: [email or web address].qutenza .com/. Discussed that the patch would be placed in office and benefits usually last 3 months. Discussed that unintended exposure to capsaicin  can cause severe irritation of eyes, mucous membranes, respiratory tract, and skin, but that Qutenza  is a local treatment and does not have the systemic side effects of other nerve medications. Discussed that there may be pain, itching, erythema, and decreased sensory function associated with the application of Qutenza . Side effects usually subside within 1 week. A cold pack of analgesic medications can help with these side effects. Blood pressure can also be increased due to pain associated with administration of the patch.   4 patches of Qutenza  (16109) was applied to her right foot. Ice packs were applied during the procedure to ensure patient comfort. Blood pressure was monitored every 15 minutes. The patient tolerated the procedure well. Post-procedure instructions were given and follow-up has been scheduled.  Topical system measures 14cm x20cm (280cm for a total 1120units) were applied which will cause deeper penetration for destruction of the peripheral nerve using a chemical (Qutenza ) which infuses into the skin like an injection and heat technique (occlusive, compressive dressing cauing endothermic heat technique)

## 2023-12-01 MED ORDER — CAPSAICIN-CLEANSING GEL 8 % EX KIT
2.0000 | PACK | Freq: Once | CUTANEOUS | Status: AC
Start: 2023-12-01 — End: 2023-12-01
  Administered 2023-12-01: 2 via TOPICAL

## 2023-12-01 NOTE — Addendum Note (Signed)
 Addended by: Darrnell Mangiaracina M on: 12/01/2023 08:37 AM   Modules accepted: Orders

## 2023-12-02 ENCOUNTER — Ambulatory Visit: Admitting: Family Medicine

## 2023-12-06 ENCOUNTER — Ambulatory Visit: Payer: Medicare HMO | Admitting: Family Medicine

## 2023-12-08 ENCOUNTER — Other Ambulatory Visit: Payer: Self-pay | Admitting: Neurology

## 2023-12-08 DIAGNOSIS — E1142 Type 2 diabetes mellitus with diabetic polyneuropathy: Secondary | ICD-10-CM

## 2023-12-18 NOTE — Progress Notes (Addendum)
 [facility] [address] [place], [place] [address] [phone]  Date:  12/20/2023   Name:  Caroline Simmons   DOB:  [birth year]   MRN:  [id number]  PCP:  Watt Harlene BROCKS, MD    Chief Complaint: Follow-up (Back pain onset  along time ago [place] /I need more needles and strips )   History of Present Illness:  Caroline Simmons is a 68 y.o. very pleasant female patient who presents with the following:  Pt seen today for a 6 month recheck Last seen by myself in December  History of diet controlled diabetes with neuropathy, hypertension, hyperlipidemia, chronic kidney disease, hypothyroidism  She is under nephrology care with Dr. Fleurette with [facility] IIIb chronic kidney disease-she was seen May 15 1. AKI (acute kidney injury) Basic Metabolic Panel  2. Stage 3b chronic kidney disease (HCC)  3. Type 2 diabetes mellitus with stage 3b chronic kidney disease, without long-term current use of insulin (CMD) 4. Primary hypertension  CKD stage III 2/2 DM, HTN with proteinuria. Baseline 1.4-1.6. Interim start of Jardiance  10 mg daily for diabetes in December by PCP. -AKI with significant worsening of creatinine at 1.9 GFR 28 this visit -Likely due to worsening diabetes control versus prerenal from Jardiance ? -Advised increase fluid intake and repeat BMP in 2 weeks -Electrolytes are acceptable -Bicarb is normal on sodium HCO3 twice daily -Blood pressure is well-controlled. She continues on lisinopril  20 mg daily -Diabetes uncontrolled last A1c of 7.9. Jardiance  started since. Advised better diabetes control -Weight is 2 pounds lower   Most recent creatinine 1.87  From our last visit: Patient seen today for follow-up.  She has history of diabetes, would like to start back on medication.  Metformin  not suitable given her kidney dysfunction.  Will have her start on Jardiance  10 mg, I have communicated this with her  nephrologist. She would also like to start back on Crestor , refill today  Lab Results  Component Value Date   HGBA1C 7.9 (H) 06/07/2023   She saw Dr Fleurette  Lab Results  Component Value Date   TSH 1.00 06/07/2023    Wt Readings from Last 3 Encounters:  12/20/23 161 lb 3.2 oz (73.1 kg)  11/29/23 164 lb (74.4 kg)  09/14/23 164 lb 9.6 oz (74.7 kg)     Patient Active Problem List   Diagnosis Date Noted   Acute gout due to renal impairment involving right ankle 03/23/2022   Peroneal tenosynovitis 12/17/2021   Arthrosis of left midfoot 10/27/2021   Stress fracture of calcaneus 09/02/2021   Pes cavus of both feet 09/02/2021   Abnormal urinalysis 09/14/2019   Vitamin D  deficiency 08/10/2017   Secondary hyperparathyroidism of renal origin (HCC) 07/30/2016   Chronic kidney disease, stage 3 (HCC) 04/08/2016   Dyslipidemia 04/02/2016   Proteinuria 04/02/2016   Fatigue 12/25/2013   Neuropathy 12/25/2013   Dyslipidemia associated with type 2 diabetes mellitus (HCC) 05/30/2013   Detached retina, left 11/25/2011   Depression with anxiety 11/25/2011   Neuropathy in diabetes (HCC) 09/15/2011   Insomnia, unspecified    Hypothyroidism    DIABETES MELLITUS, TYPE II 08/26/2010   Essential hypertension 08/26/2010   RECTAL PAIN 08/26/2010   Peripheral motor neuropathy 06/22/2009    Past Medical History:  Diagnosis Date   Alopecia    Anxiety    on meds   Arthritis    back   Cataract 2011   bilateral removal   Chronic  kidney disease (CKD), stage III (moderate) (HCC)    Depression    on meds   Essential hypertension, benign    on meds   GERD (gastroesophageal reflux disease)    on meds   Hyperlipidemia    on meds   Hypothyroid    on meds   Insomnia, unspecified    Menopause    lmp 06/2007   Neuropathy    per pt   Type II or unspecified type diabetes mellitus without mention of complication, not stated as uncontrolled    on meds   Vitamin D  deficiency     Past Surgical  History:  Procedure Laterality Date   CATARACT EXTRACTION, BILATERAL     CESAREAN SECTION     1982/1991   COLONOSCOPY  2012   [facility]-F/V-movi(exc)-HPP   POLYPECTOMY  2012   HPP   RETINAL DETACHMENT REPAIR W/ SCLERAL BUCKLE LE  11/12/2011   WISDOM TOOTH EXTRACTION      Social History   Tobacco Use   Smoking status: Former    Current packs/day: 1.00    Average packs/day: 1 pack/day for 3.0 years (3.0 ttl pk-yrs)    Types: Cigarettes   Smokeless tobacco: Never   Tobacco comments:    quit 08/2003  Vaping Use   Vaping status: Never Used  Substance Use Topics   Alcohol use: Not Currently    Comment: rare   Drug use: No    Family History  Problem Relation Age of Onset   Diabetes Mother        type 2  not sure onset   Diabetes Other    Hypertension Other    Colon cancer Neg Hx    Colon polyps Neg Hx    Esophageal cancer Neg Hx    Stomach cancer Neg Hx    Rectal cancer Neg Hx     Allergies  Allergen Reactions   Zoloft  [Sertraline Hcl]    Codeine Nausea Only and Other (See Comments)    Pt states she has taken this with no problems.  Other Reaction(s): GI Intolerance   Lamotrigine Rash    Other reaction(s): RASH    Medication list has been reviewed and updated.  Current Outpatient Medications on File Prior to Visit  Medication Sig Dispense Refill   amitriptyline  (ELAVIL ) 100 MG tablet TAKE 1 TABLET BY MOUTH EVERYDAY AT BEDTIME 90 tablet 3   aspirin 81 MG tablet Take 81 mg by mouth daily.     Baclofen  5 MG TABS Take 1 tablet (5 mg total) by mouth at bedtime. 90 tablet 3   Biotin 10 MG CAPS Take 1 tablet by mouth daily.     carbamazepine  (TEGRETOL ) 200 MG tablet Take 1 tablet (200 mg total) by mouth in the morning and at bedtime. 180 tablet 3   cholecalciferol (VITAMIN D3) 25 MCG (1000 UNIT) tablet Take by mouth.     [employer] Lancets Micro Thin 33G MISC APPLY IN THE MORNING AND AT BEDTIME. E11.42     empagliflozin  (JARDIANCE ) 10 MG TABS tablet Take 1 tablet (10 mg  total) by mouth daily before breakfast. 90 tablet 1   glucose blood (TRUE METRIX PRO BLOOD GLUCOSE) test strip Check blood sugars once daily 100 strip 12   Lancets 33G MISC 1 each by Does not apply route in the morning and at bedtime. E11.42 100 each 12   levothyroxine  (SYNTHROID ) 50 MCG tablet Take 1 tablet (50 mcg total) by mouth daily before breakfast. 90 tablet 1  lisinopril  (ZESTRIL ) 20 MG tablet Take 1 tablet (20 mg total) by mouth daily. 90 tablet 1   omeprazole  (PRILOSEC) 20 MG capsule as needed.     rosuvastatin  (CRESTOR ) 20 MG tablet Take 1 tablet (20 mg total) by mouth daily. 90 tablet 3   rosuvastatin  (CRESTOR ) 20 MG tablet Take 1 tablet by mouth at bedtime.     sodium bicarbonate 650 MG tablet Take 650 mg by mouth 2 (two) times daily.     allopurinol  (ZYLOPRIM ) 100 MG tablet TAKE 1 TABLET BY MOUTH EVERY DAY (Patient not taking: Reported on 12/20/2023) 90 tablet 1   finasteride (PROSCAR) 5 MG tablet  (Patient not taking: Reported on 12/20/2023)     HYDROcodone -acetaminophen  (NORCO/VICODIN) 5-325 MG tablet Take 1 tablet by mouth every 8 (eight) hours as needed. (Patient not taking: Reported on 12/20/2023) 8 tablet 0   No current facility-administered medications on file prior to visit.    Review of Systems:  As per HPI- otherwise negative.   Physical Examination: Vitals:   12/20/23 1601  BP: 132/88  Pulse: 82  SpO2: 98%   Vitals:   12/20/23 1601  Weight: 161 lb 3.2 oz (73.1 kg)  Height: 5' 3 (1.6 m)   Body mass index is 28.56 kg/m. Ideal Body Weight: Weight in (lb) to have BMI = 25: 140.8  GEN: no acute distress.  Mildly overweight, looks well HEENT: Atraumatic, Normocephalic.  Bilateral TM wnl, oropharynx normal.  PEERL,EOMI.   Ears and Nose: No external deformity. CV: RRR, No M/G/R. No JVD. No thrill. No extra heart sounds. PULM: CTA B, no wheezes, crackles, rhonchi. No retractions. No resp. distress. No accessory muscle use. ABD: S, NT, ND, +BS. No rebound. No  HSM. EXTR: No c/c/e PSYCH: Normally interactive. Conversant.    Assessment and Plan: Controlled type 2 diabetes mellitus with diabetic polyneuropathy, without long-term current use of insulin (HCC) - Plan: Hemoglobin A1c  Hypothyroidism due to acquired atrophy of thyroid  - Plan: TSH  Dyslipidemia associated with type 2 diabetes mellitus (HCC)  Essential hypertension - Plan: CBC, Comprehensive metabolic panel with GFR  Stage 3 chronic kidney disease, unspecified whether stage 3a or 3b CKD (HCC) - Plan: Comprehensive metabolic panel with GFR  Patient seen today for follow-up.  She has history of diabetes with associated chronic renal insufficiency.  She is followed by nephrology.  Right now she is on just Jardiance  for her diabetes.  If we need to add something like a GLP-1 would be preferred.  I will be in touch with her A1c, also follow-up on her thyroid  and kidney function today  Signed Harlene Schroeder, MD  Received her labs 7/3, called patient  Results for orders placed or performed in visit on 12/20/23  Hemoglobin A1c   Collection Time: 12/20/23  4:27 PM  Result Value Ref Range   Hgb A1c MFr Bld 8.0 (H) 4.6 - 6.5 %  TSH   Collection Time: 12/20/23  4:27 PM  Result Value Ref Range   TSH 0.81 0.35 - 5.50 uIU/mL  CBC   Collection Time: 12/20/23  4:27 PM  Result Value Ref Range   WBC 6.2 4.0 - 10.5 K/uL   RBC 4.53 3.87 - 5.11 Mil/uL   Platelets 192.0 150.0 - 400.0 K/uL   Hemoglobin 13.3 12.0 - 15.0 g/dL   HCT 59.3 63.9 - 53.9 %   MCV 89.5 78.0 - 100.0 fl   MCHC 32.8 30.0 - 36.0 g/dL   RDW 85.6 88.4 - 84.4 %  Comprehensive  metabolic panel with GFR   Collection Time: 12/20/23  4:27 PM  Result Value Ref Range   Sodium 140 135 - 145 mEq/L   Potassium 4.8 3.5 - 5.1 mEq/L   Chloride 102 96 - 112 mEq/L   CO2 26 19 - 32 mEq/L   Glucose, Bld 131 (H) 70 - 99 mg/dL   BUN 24 (H) 6 - 23 mg/dL   Creatinine, Ser 8.17 (H) 0.40 - 1.20 mg/dL   Total Bilirubin 0.4 0.2 - 1.2 mg/dL    Alkaline Phosphatase 64 39 - 117 U/L   AST 21 0 - 37 U/L   ALT 17 0 - 35 U/L   Total Protein 7.5 6.0 - 8.3 g/dL   Albumin 4.7 3.5 - 5.2 g/dL   GFR 71.53 (L) >39.99 mL/min   Calcium  9.9 8.4 - 10.5 mg/dL   Addendum 7/9.  Yesterday I heard from her nephrologist Dr. Fleurette -she is in agreement with holding Jardiance  to see if patient's renal function will improve.  We think her next follow-up appointment with nephrology is in September. I called patient and gave her this update

## 2023-12-20 ENCOUNTER — Ambulatory Visit: Admitting: Family Medicine

## 2023-12-20 VITALS — BP 132/88 | HR 82 | Ht 63.0 in | Wt 161.2 lb

## 2023-12-20 DIAGNOSIS — E1169 Type 2 diabetes mellitus with other specified complication: Secondary | ICD-10-CM | POA: Diagnosis not present

## 2023-12-20 DIAGNOSIS — E785 Hyperlipidemia, unspecified: Secondary | ICD-10-CM | POA: Diagnosis not present

## 2023-12-20 DIAGNOSIS — E1142 Type 2 diabetes mellitus with diabetic polyneuropathy: Secondary | ICD-10-CM

## 2023-12-20 DIAGNOSIS — I10 Essential (primary) hypertension: Secondary | ICD-10-CM | POA: Diagnosis not present

## 2023-12-20 DIAGNOSIS — N183 Chronic kidney disease, stage 3 unspecified: Secondary | ICD-10-CM | POA: Diagnosis not present

## 2023-12-20 DIAGNOSIS — E034 Atrophy of thyroid (acquired): Secondary | ICD-10-CM

## 2023-12-20 NOTE — Patient Instructions (Signed)
 It was good to see you today- I will be in touch with your labs asap If we want to work on your diabetes control further I would suggest adding a GLP-1 drug like ozempic. Let's see how things look!

## 2023-12-21 LAB — COMPREHENSIVE METABOLIC PANEL WITH GFR
ALT: 17 U/L (ref 0–35)
AST: 21 U/L (ref 0–37)
Albumin: 4.7 g/dL (ref 3.5–5.2)
Alkaline Phosphatase: 64 U/L (ref 39–117)
BUN: 24 mg/dL — ABNORMAL HIGH (ref 6–23)
CO2: 26 meq/L (ref 19–32)
Calcium: 9.9 mg/dL (ref 8.4–10.5)
Chloride: 102 meq/L (ref 96–112)
Creatinine, Ser: 1.82 mg/dL — ABNORMAL HIGH (ref 0.40–1.20)
GFR: 28.46 mL/min — ABNORMAL LOW (ref >60.00)
Glucose, Bld: 131 mg/dL — ABNORMAL HIGH (ref 70–99)
Potassium: 4.8 meq/L (ref 3.5–5.1)
Sodium: 140 meq/L (ref 135–145)
Total Bilirubin: 0.4 mg/dL (ref 0.2–1.2)
Total Protein: 7.5 g/dL (ref 6.0–8.3)

## 2023-12-21 LAB — CBC
HCT: 40.6 % (ref 36.0–46.0)
Hemoglobin: 13.3 g/dL (ref 12.0–15.0)
MCHC: 32.8 g/dL (ref 30.0–36.0)
MCV: 89.5 fl (ref 78.0–100.0)
Platelets: 192 10*3/uL (ref 150.0–400.0)
RBC: 4.53 Mil/uL (ref 3.87–5.11)
RDW: 14.3 % (ref 11.5–15.5)
WBC: 6.2 10*3/uL (ref 4.0–10.5)

## 2023-12-21 LAB — HEMOGLOBIN A1C: Hgb A1c MFr Bld: 8 % — ABNORMAL HIGH (ref 4.6–6.5)

## 2023-12-21 LAB — TSH: TSH: 0.81 u[IU]/mL (ref 0.35–5.50)

## 2023-12-23 MED ORDER — OZEMPIC (0.25 OR 0.5 MG/DOSE) 2 MG/3ML ~~LOC~~ SOPN: PEN_INJECTOR | SUBCUTANEOUS | 5 refills | Status: DC

## 2023-12-23 NOTE — Addendum Note (Signed)
 Addended by: WATT RAISIN C on: 12/23/2023 01:05 PM   Modules accepted: Orders

## 2023-12-29 NOTE — Addendum Note (Signed)
 Addended by: WATT RAISIN C on: 12/29/2023 12:38 PM   Modules accepted: Orders

## 2024-01-04 ENCOUNTER — Ambulatory Visit (INDEPENDENT_AMBULATORY_CARE_PROVIDER_SITE_OTHER)

## 2024-01-04 VITALS — Ht 63.0 in | Wt 161.0 lb

## 2024-01-04 DIAGNOSIS — Z0000 Encounter for general adult medical examination without abnormal findings: Secondary | ICD-10-CM | POA: Diagnosis not present

## 2024-01-04 NOTE — Patient Instructions (Signed)
 [name] , Thank you for taking time out of your busy schedule to complete your Annual Wellness Visit with me. I enjoyed our conversation and look forward to speaking with you again next year. I, as well as your care team,  appreciate your ongoing commitment to your health goals. Please review the following plan we discussed and let me know if I can assist you in the future. Your Game plan/ To Do List    Follow up Visits: Next Medicare AWV with our clinical staff: In 1 year    Have you seen your provider in the last 6 months (3 months if uncontrolled diabetes)? Yes Next Office Visit with your provider: 06/19/24 @ 4  Clinician Recommendations:  Aim for 30 minutes of exercise or brisk walking, 6-8 glasses of water, and 5 servings of fruits and vegetables each day.       This is a list of the screening recommended for you and due dates:  Health Maintenance  Topic Date Due   Yearly kidney health urinalysis for diabetes  Never done   COVID-19 Vaccine (4 - 2024-25 season) 02/21/2023   Colon Cancer Screening  02/06/2024   Flu Shot  01/21/2024   Eye exam for diabetics  03/14/2024   Complete foot exam   06/06/2024   Hemoglobin A1C  06/20/2024   Mammogram  12/14/2024   Yearly kidney function blood test for diabetes  12/19/2024   Medicare Annual Wellness Visit  01/03/2025   DTaP/Tdap/Td vaccine (3 - Td or Tdap) 01/07/2027   Pneumococcal Vaccine for age over 4  Completed   Hepatitis C Screening  Completed   Zoster (Shingles) Vaccine  Completed   Hepatitis B Vaccine  Aged Out   HPV Vaccine  Aged Out   Meningitis B Vaccine  Aged Out    Advanced directives: (ACP Link)Information on Advanced Care Planning can be found at [employer] Health Care Directives Advance Health Care Directives. [email or web address]   Advance Care Planning is important because it:  [x]  Makes sure you receive the medical care that is consistent with your values, goals, and preferences  [x]  It  provides guidance to your family and loved ones and reduces their decisional burden about whether or not they are making the right decisions based on your wishes.  Follow the link provided in your after visit summary or read over the paperwork we have mailed to you to help you started getting your Advance Directives in place. If you need assistance in completing these, please reach out to us  so that we can help you!  See attachments for Preventive Care and Fall Prevention Tips.

## 2024-01-04 NOTE — Progress Notes (Signed)
 Subjective:   Caroline Simmons is a 68 y.o. who presents for a Medicare Wellness preventive visit.  As a reminder, Annual Wellness Visits don't include a physical exam, and some assessments may be limited, especially if this visit is performed virtually. We may recommend an in-person follow-up visit with your provider if needed.  Visit Complete: Virtual I connected with  Caroline Simmons on 01/04/24 by a audio enabled telemedicine application and verified that I am speaking with the correct person using two identifiers.  Patient Location: Home  Provider Location: Home Office  I discussed the limitations of evaluation and management by telemedicine. The patient expressed understanding and agreed to proceed.  Vital Signs: Because this visit was a virtual/telehealth visit, some criteria may be missing or patient reported. Any vitals not documented were not able to be obtained and vitals that have been documented are patient reported.  VideoDeclined- This patient declined Librarian, academic. Therefore the visit was completed with audio only.  Persons Participating in Visit: Patient.  AWV Questionnaire: No: Patient Medicare AWV questionnaire was not completed prior to this visit.  Cardiac Risk Factors include: advanced age (>34men, >16 women);diabetes mellitus;dyslipidemia;hypertension     Objective:    Today's Vitals   01/04/24 1540  Weight: 161 lb (73 kg)  Height: 5' 3 (1.6 m)   Body mass index is 28.52 kg/m.     01/04/2024    3:55 PM 09/14/2023    1:44 PM 01/07/2023    1:49 PM 08/31/2022    1:34 PM 12/30/2021    1:49 PM 08/22/2021    1:36 PM 12/24/2020    1:04 PM  Advanced Directives  Does Patient Have a Medical Advance Directive? No Yes Yes Yes Yes Yes Yes  Type of Advance Directive  Living will Living will Living will Living will Living will Living will  Does patient want to make changes to medical advance directive?   No - Patient declined  No -  Patient declined    Would patient like information on creating a medical advance directive? Yes (MAU/Ambulatory/Procedural Areas - Information given)          Current Medications (verified) Outpatient Encounter Medications as of 01/04/2024  Medication Sig   amitriptyline  (ELAVIL ) 100 MG tablet TAKE 1 TABLET BY MOUTH EVERYDAY AT BEDTIME   aspirin 81 MG tablet Take 81 mg by mouth daily.   Baclofen  5 MG TABS Take 1 tablet (5 mg total) by mouth at bedtime.   Biotin 10 MG CAPS Take 1 tablet by mouth daily.   carbamazepine  (TEGRETOL ) 200 MG tablet Take 1 tablet (200 mg total) by mouth in the morning and at bedtime.   cholecalciferol (VITAMIN D3) 25 MCG (1000 UNIT) tablet Take by mouth.   [facility] Lancets Micro Thin 33G MISC APPLY IN THE MORNING AND AT BEDTIME. E11.42   empagliflozin  (JARDIANCE ) 10 MG TABS tablet Pt is holding right now to await recheck kidney function 12/29/23   glucose blood (TRUE METRIX PRO BLOOD GLUCOSE) test strip Check blood sugars once daily   Lancets 33G MISC 1 each by Does not apply route in the morning and at bedtime. E11.42   levothyroxine  (SYNTHROID ) 50 MCG tablet Take 1 tablet (50 mcg total) by mouth daily before breakfast.   lisinopril  (ZESTRIL ) 20 MG tablet Take 1 tablet (20 mg total) by mouth daily.   omeprazole  (PRILOSEC) 20 MG capsule as needed.   rosuvastatin  (CRESTOR ) 20 MG tablet Take 1 tablet (20 mg total) by mouth daily.  rosuvastatin  (CRESTOR ) 20 MG tablet Take 1 tablet by mouth at bedtime.   Semaglutide ,0.25 or 0.5MG /DOS, (OZEMPIC , 0.25 OR 0.5 MG/DOSE,) 2 MG/3ML SOPN Inject 0.25 mg [place] weekly for 4 weeks.  Then increase to 0.5 mg weekly   sodium bicarbonate 650 MG tablet Take 650 mg by mouth 2 (two) times daily.   allopurinol  (ZYLOPRIM ) 100 MG tablet TAKE 1 TABLET BY MOUTH EVERY DAY (Patient not taking: Reported on 01/04/2024)   finasteride (PROSCAR) 5 MG tablet  (Patient not taking: Reported on 01/04/2024)   HYDROcodone -acetaminophen  (NORCO/VICODIN) 5-325 MG tablet  Take 1 tablet by mouth every 8 (eight) hours as needed. (Patient not taking: Reported on 01/04/2024)   No facility-administered encounter medications on file as of 01/04/2024.    Allergies (verified) Zoloft  [sertraline hcl], Codeine, and Lamotrigine   History: Past Medical History:  Diagnosis Date   Alopecia    Anxiety    on meds   Arthritis    back   Cataract 2011   bilateral removal   Chronic kidney disease (CKD), stage III (moderate) (HCC)    Depression    on meds   Essential hypertension, benign    on meds   GERD (gastroesophageal reflux disease)    on meds   Hyperlipidemia    on meds   Hypothyroid    on meds   Insomnia, unspecified    Menopause    lmp 06/2007   Neuropathy    per pt   Type II or unspecified type diabetes mellitus without mention of complication, not stated as uncontrolled    on meds   Vitamin D  deficiency    Past Surgical History:  Procedure Laterality Date   CATARACT EXTRACTION, BILATERAL     CESAREAN SECTION     1982/1991   COLONOSCOPY  2012   [place]-F/V-movi(exc)-HPP   POLYPECTOMY  2012   HPP   RETINAL DETACHMENT REPAIR W/ SCLERAL BUCKLE LE  11/12/2011   WISDOM TOOTH EXTRACTION     Family History  Problem Relation Age of Onset   Diabetes Mother        type 2  not sure onset   Diabetes Other    Hypertension Other    Colon cancer Neg Hx    Colon polyps Neg Hx    Esophageal cancer Neg Hx    Stomach cancer Neg Hx    Rectal cancer Neg Hx    Social History   Socioeconomic History   Marital status: Divorced    Spouse name: Not on file   Number of children: 3   Years of education: 14   Highest education level: Not on file  Occupational History   Occupation: disabled  Tobacco Use   Smoking status: Former    Current packs/day: 1.00    Average packs/day: 1 pack/day for 3.0 years (3.0 ttl pk-yrs)    Types: Cigarettes   Smokeless tobacco: Never   Tobacco comments:    quit 08/2003  Vaping Use   Vaping status: Never Used   Substance and Sexual Activity   Alcohol use: Not Currently    Comment: rare   Drug use: No   Sexual activity: Not Currently  Other Topics Concern   Not on file  Social History Narrative   Lives alone in a 2 story home.  Has 3 children.  On disability.  Did work for [employer] for 29 years.  Education: some college.       Right handed        Are you  currently employed ? No    What is your current occupation?disability   Do you live at home alone? yes   Who lives with you?    What type of home do you live in: 1 story or 2 story? two    Caffeine 1 cup    Social Drivers of Health   Financial Resource Strain: Low Risk  (01/04/2024)   Overall Financial Resource Strain (CARDIA)    Difficulty of Paying Living Expenses: Not very hard  Food Insecurity: Food Insecurity Present (01/04/2024)   Hunger Vital Sign    Worried About Running Out of Food in the Last Year: Sometimes true    Ran Out of Food in the Last Year: Never true  Transportation Needs: No Transportation Needs (01/04/2024)   PRAPARE - Administrator, Civil Service (Medical): No    Lack of Transportation (Non-Medical): No  Physical Activity: Inactive (01/04/2024)   Exercise Vital Sign    Days of Exercise per Week: 0 days    Minutes of Exercise per Session: 0 min  Stress: No Stress Concern Present (01/04/2024)   [employer] of Occupational Health - Occupational Stress Questionnaire    Feeling of Stress: Only a little  Social Connections: Moderately Isolated (01/04/2024)   Social Connection and Isolation Panel    Frequency of Communication with Friends and Family: More than three times a week    Frequency of Social Gatherings with Friends and Family: More than three times a week    Attends Religious Services: Never    Database administrator or Organizations: No    Attends Banker Meetings: Never    Marital Status: Married    Tobacco Counseling Counseling given: Not Answered Tobacco  comments: quit 08/2003    Clinical Intake:  Pre-visit preparation completed: Yes  Pain : No/denies pain     Diabetes: No  Lab Results  Component Value Date   HGBA1C 8.0 (H) 12/20/2023   HGBA1C 7.9 (H) 06/07/2023   HGBA1C 7.3 (H) 12/03/2022     How often do you need to have someone help you when you read instructions, pamphlets, or other written materials from your doctor or pharmacy?: 1 - Never  Interpreter Needed?: No  Information entered by :: Charmaine Bloodgood LPN   Activities of Daily Living     01/04/2024    3:54 PM 01/07/2023    1:43 PM  In your present state of health, do you have any difficulty performing the following activities:  Hearing? 0 0  Vision? 0 0  Difficulty concentrating or making decisions? 0 1  Comment  sometimes remembering  Walking or climbing stairs? 0 1  Dressing or bathing? 0 0  Doing errands, shopping? 0 0  Preparing Food and eating ? N N  Using the Toilet? N N  In the past six months, have you accidently leaked urine? N N  Do you have problems with loss of bowel control? N N  Managing your Medications? N N  Managing your Finances? N N  Housekeeping or managing your Housekeeping? N N    Patient Care Team: Copland, Harlene BROCKS, MD as PCP - General (Family Medicine) Austin Ned, MD as Consulting Physician (Obstetrics and Gynecology) Fleurette Brow, MD as Referring Physician (Nephrology) Tobie Tonita POUR, DO as Consulting Physician (Neurology) Maree Lonni Inks, MD as Consulting Physician (Ophthalmology) Lorilee Sven SQUIBB, MD as Consulting Physician (Physical Medicine and Rehabilitation)  I have updated your Care Teams any recent Medical Services you may have  received from other providers in the past year.     Assessment:   This is a routine wellness examination for [place].  Hearing/Vision screen Hearing Screening - Comments:: Denies hearing difficulties   Vision Screening - Comments:: Wears rx glasses - up to date with  routine eye exams with Dr. Maree    Goals Addressed             This Visit's Progress    Prevent falls         Depression Screen     01/04/2024    3:52 PM 11/29/2023    3:00 PM 08/16/2023    2:38 PM 02/16/2023    1:30 PM 01/07/2023    1:49 PM 12/30/2021    1:50 PM 12/24/2020    1:06 PM  PHQ 2/9 Scores  PHQ - 2 Score 0 0 0 3 0 1 1  PHQ- 9 Score    11       Fall Risk     01/04/2024    3:54 PM 11/29/2023    3:00 PM 09/14/2023    1:44 PM 08/16/2023    2:38 PM 02/16/2023    1:28 PM  Fall Risk   Falls in the past year? 1 0 1 0 1  Comment    Last fall in Feb 2024 at home with no major injury. Fall in August 2024 at home with major injury.  Number falls in past yr: 1 0 0 1 1  Injury with Fall? 1 0 0 0 0  Risk for fall due to : History of fall(s);Impaired balance/gait;Impaired mobility      Follow up Education provided;Falls prevention discussed;Falls evaluation completed        MEDICARE RISK AT HOME:  Medicare Risk at Home Any stairs in or around the home?: No If so, are there any without handrails?: No Home free of loose throw rugs in walkways, pet beds, electrical cords, etc?: Yes Adequate lighting in your home to reduce risk of falls?: Yes Life alert?: No Use of a cane, walker or w/c?: No Grab bars in the bathroom?: Yes Shower chair or bench in shower?: No Elevated toilet seat or a handicapped toilet?: Yes  TIMED UP AND GO:  Was the test performed?  No  Cognitive Function: Declined/Normal: No cognitive concerns noted by patient or family. Patient alert, oriented, able to answer questions appropriately and recall recent events. No signs of memory loss or confusion.    08/19/2018    2:16 PM 05/27/2016    2:30 PM  MMSE - Mini Mental State Exam  Orientation to time 5 5   Orientation to Place 5 5   Registration 3 3   Attention/ Calculation 5 4   Recall 1 2   Language- name 2 objects 2 2   Language- repeat 0 1  Language- follow 3 step command 3 3   Language- read & follow  direction 1 1   Write a sentence 1 1   Copy design 1 1   Total score 27 28      Data saved with a previous flowsheet row definition        01/07/2023    1:50 PM 12/30/2021    2:00 PM  6CIT Screen  What Year? 0 points 0 points  What month? 0 points 0 points  What time? 0 points 0 points  Count back from 20 2 points 0 points  Months in reverse 0 points 0 points  Repeat phrase 4 points 10 points  Total Score 6 points 10 points    Immunizations Immunization History  Administered Date(s) Administered   Fluad Quad(high Dose 65+) 06/04/2022   Fluad Trivalent(High Dose 65+) 06/07/2023   Influenza Split 05/13/2012   Influenza,inj,Quad PF,6+ Mos 05/30/2013, 04/05/2014, 03/13/2015, 02/19/2016, 02/16/2018, 03/06/2019, 06/04/2021   Influenza-Unspecified 04/08/2011   PFIZER(Purple Top)SARS-COV-2 Vaccination 09/18/2019, 10/11/2019, 05/24/2020   PNEUMOCOCCAL CONJUGATE-20 12/02/2020   Pneumococcal Polysaccharide-23 05/01/2015   Td 09/20/2006   Tdap 01/06/2017   Zoster Recombinant(Shingrix) 12/14/2021, 09/19/2022    Screening Tests Health Maintenance  Topic Date Due   Diabetic kidney evaluation - Urine ACR  Never done   COVID-19 Vaccine (4 - 2024-25 season) 02/21/2023   Colonoscopy  02/06/2024   INFLUENZA VACCINE  01/21/2024   OPHTHALMOLOGY EXAM  03/14/2024   FOOT EXAM  06/06/2024   HEMOGLOBIN A1C  06/20/2024   MAMMOGRAM  12/14/2024   Diabetic kidney evaluation - eGFR measurement  12/19/2024   Medicare Annual Wellness (AWV)  01/03/2025   DTaP/Tdap/Td (3 - Td or Tdap) 01/07/2027   Pneumococcal Vaccine: 50+ Years  Completed   Hepatitis C Screening  Completed   Zoster Vaccines- Shingrix  Completed   Hepatitis B Vaccines  Aged Out   HPV VACCINES  Aged Out   Meningococcal B Vaccine  Aged Out    Health Maintenance  Health Maintenance Due  Topic Date Due   Diabetic kidney evaluation - Urine ACR  Never done   COVID-19 Vaccine (4 - 2024-25 season) 02/21/2023   Colonoscopy   02/06/2024   Health Maintenance Items Addressed: Patient would like to postpone colon screening   Additional Screening:  Vision Screening: Recommended annual ophthalmology exams for early detection of glaucoma and other disorders of the eye. Would you like a referral to an eye doctor? No    Dental Screening: Recommended annual dental exams for proper oral hygiene  Community Resource Referral / Chronic Care Management: CRR required this visit?  No   CCM required this visit?  No   Plan:    I have personally reviewed and noted the following in the patient's chart:   Medical and social history Use of alcohol, tobacco or illicit drugs  Current medications and supplements including opioid prescriptions. Patient is not currently taking opioid prescriptions. Functional ability and status Nutritional status Physical activity Advanced directives List of other physicians Hospitalizations, surgeries, and ER visits in previous 12 months Vitals Screenings to include cognitive, depression, and falls Referrals and appointments  In addition, I have reviewed and discussed with patient certain preventive protocols, quality metrics, and best practice recommendations. A written personalized care plan for preventive services as well as general preventive health recommendations were provided to patient.   Lavelle Pfeiffer [place], [place]   [id number]   After Visit Summary: (Pick Up) Due to this being a telephonic visit, with patients personalized plan was offered to patient and patient has requested to Pick up at office.  Notes: Please refer to Routing Comments.

## 2024-02-06 ENCOUNTER — Other Ambulatory Visit: Payer: Self-pay | Admitting: Physical Medicine and Rehabilitation

## 2024-02-06 ENCOUNTER — Other Ambulatory Visit: Payer: Self-pay | Admitting: Family Medicine

## 2024-02-06 DIAGNOSIS — I10 Essential (primary) hypertension: Secondary | ICD-10-CM

## 2024-02-06 DIAGNOSIS — E034 Atrophy of thyroid (acquired): Secondary | ICD-10-CM

## 2024-03-03 ENCOUNTER — Other Ambulatory Visit: Payer: Self-pay

## 2024-03-03 ENCOUNTER — Telehealth: Payer: Self-pay

## 2024-03-03 DIAGNOSIS — N1832 Chronic kidney disease, stage 3b: Secondary | ICD-10-CM | POA: Diagnosis not present

## 2024-03-03 DIAGNOSIS — G629 Polyneuropathy, unspecified: Secondary | ICD-10-CM

## 2024-03-03 DIAGNOSIS — E1141 Type 2 diabetes mellitus with diabetic mononeuropathy: Secondary | ICD-10-CM

## 2024-03-03 MED ORDER — QUTENZA (4 PATCH) 8 % EX KIT: 4.0000 | PACK | Freq: Once | CUTANEOUS | 0 refills | Status: AC

## 2024-03-03 NOTE — Telephone Encounter (Signed)
 Patient wants a virtual visit on 03/06/24. Is that okay? If so could you let April know? We are still wanting on the patches to come in.

## 2024-03-03 NOTE — Telephone Encounter (Signed)
 refill

## 2024-03-04 MED ORDER — BACLOFEN 5 MG PO TABS: 1.0000 | ORAL_TABLET | Freq: Three times a day (TID) | ORAL | 3 refills | Status: DC | PRN

## 2024-03-06 ENCOUNTER — Encounter: Admitting: Physical Medicine and Rehabilitation

## 2024-03-06 DIAGNOSIS — E1141 Type 2 diabetes mellitus with diabetic mononeuropathy: Secondary | ICD-10-CM | POA: Insufficient documentation

## 2024-03-07 DIAGNOSIS — E1122 Type 2 diabetes mellitus with diabetic chronic kidney disease: Secondary | ICD-10-CM | POA: Diagnosis not present

## 2024-03-07 DIAGNOSIS — E872 Acidosis, unspecified: Secondary | ICD-10-CM | POA: Diagnosis not present

## 2024-03-07 DIAGNOSIS — N1832 Chronic kidney disease, stage 3b: Secondary | ICD-10-CM | POA: Diagnosis not present

## 2024-03-07 DIAGNOSIS — R809 Proteinuria, unspecified: Secondary | ICD-10-CM | POA: Diagnosis not present

## 2024-03-16 ENCOUNTER — Other Ambulatory Visit: Payer: Self-pay

## 2024-03-16 MED ORDER — BACLOFEN 5 MG PO TABS: 1.0000 | ORAL_TABLET | Freq: Three times a day (TID) | ORAL | 3 refills | Status: AC | PRN

## 2024-03-21 NOTE — Progress Notes (Addendum)
 [employer] [employer] [facility] [employer] [address], [address] [place], [place] [address] [phone]  Date:  03/23/2024   Name:  Caroline Simmons   DOB:  January 14, 1956   MRN:  [id number]  PCP:  Caroline Harlene BROCKS, MD    Chief Complaint: Follow-up (The Ozempic  makes me feel like I want to throw up all the time )   History of Present Illness:  Caroline Simmons is a 68 y.o. very pleasant female patient who presents with the following:  Pt seen today for a follow-up visit Last seen by me in June  History of diet controlled diabetes with neuropathy, hypertension, hyperlipidemia, chronic kidney disease, hypothyroidism  She is under nephrology care with Dr. Fleurette with June [place] IIIb chronic kidney disease She saw nephrology about 2 weeks ago  CKD stage III 2/2 DM, HTN with proteinuria. Baseline 1.4-1.6. Interim start of Jardiance  10 mg daily for diabetes in December by PCP. Creatinine since has been 1.8-1.9 -We advised her to increase fluid intake -Creatinine is 1.2 GFR 30 -Electrolytes are acceptable -Bicarb is normal on sodium HCO3 twice daily -Blood pressure improved on recheck -Diabetes uncontrolled with A1c of 8.0 in June -Jardiance  switched to Ozempic  since - Weight is significantly lower with Ozempic . She is feeling better - Due for A1c recheck with PCP next month -CKD MBD labs next visit Return in about 4 months (around 07/07/2024).  Recommend covid booster Flu shot -give today Eye exam Colon cancer screening  Discussed the use of AI scribe software for clinical note transcription with the patient, who gave verbal consent to proceed.  Wt Readings from Last 3 Encounters:  03/23/24 139 lb (63 kg)  01/04/24 161 lb (73 kg)  12/20/23 161 lb 3.2 oz (73.1 kg)     [name] who presents with nausea and appetite suppression due to Ozempic  use.  She has been experiencing  persistent nausea and appetite suppression since starting Ozempic  approximately three months ago. Nausea occurs daily and significantly impacts her ability to eat, with symptoms worsening in the evenings. Despite missing a dose during a one-week vacation, there was no improvement in symptoms.  Initially, she started on a higher dose of Ozempic , which led to significant weight loss of about 20 pounds, reducing her weight to approximately 130 pounds. She found the higher dose too strong and reduced it to 0.25 mg, which is more tolerable, although some nausea persists. Her husband mentions she supplements her diet with protein shakes to maintain adequate nutrition despite her reduced appetite.  She has a history of diabetes and was previously on Jardiance  before switching to Ozempic . Currently, she is on a 0.25 mg dose of Ozempic , which she finds more manageable.    Patient Active Problem List   Diagnosis Date Noted   Acute gout due to renal impairment involving right ankle 03/23/2022   Peroneal tenosynovitis 12/17/2021   Arthrosis of left midfoot 10/27/2021   Stress fracture of calcaneus 09/02/2021   Pes cavus of both feet 09/02/2021   Abnormal urinalysis 09/14/2019   Vitamin D  deficiency 08/10/2017   Secondary hyperparathyroidism of renal origin 07/30/2016   Chronic kidney disease, stage 3 (HCC) 04/08/2016   Dyslipidemia 04/02/2016   Proteinuria 04/02/2016   Fatigue 12/25/2013   Neuropathy 12/25/2013   Dyslipidemia associated with type 2 diabetes mellitus (HCC) 05/30/2013   Detached retina, left 11/25/2011   Depression with anxiety 11/25/2011  Neuropathy in diabetes (HCC) 09/15/2011   Insomnia, unspecified    Hypothyroidism    DIABETES MELLITUS, TYPE II 08/26/2010   Essential hypertension 08/26/2010   RECTAL PAIN 08/26/2010   Peripheral motor neuropathy 06/22/2009    Past Medical History:  Diagnosis Date   Alopecia    Anxiety    on meds   Arthritis    back   Cataract 2011    bilateral removal   Chronic kidney disease (CKD), stage III (moderate) (HCC)    Depression    on meds   Essential hypertension, benign    on meds   GERD (gastroesophageal reflux disease)    on meds   Hyperlipidemia    on meds   Hypothyroid    on meds   Insomnia, unspecified    Menopause    lmp 06/2007   Neuropathy    per pt   Type II or unspecified type diabetes mellitus without mention of complication, not stated as uncontrolled    on meds   Vitamin D  deficiency     Past Surgical History:  Procedure Laterality Date   CATARACT EXTRACTION, BILATERAL     CESAREAN SECTION     1982/1991   COLONOSCOPY  2012   [facility]-F/V-movi(exc)-HPP   POLYPECTOMY  2012   HPP   RETINAL DETACHMENT REPAIR W/ SCLERAL BUCKLE LE  11/12/2011   WISDOM TOOTH EXTRACTION      Social History   Tobacco Use   Smoking status: Former    Current packs/day: 1.00    Average packs/day: 1 pack/day for 3.0 years (3.0 ttl pk-yrs)    Types: Cigarettes   Smokeless tobacco: Never   Tobacco comments:    quit 08/2003  Vaping Use   Vaping status: Never Used  Substance Use Topics   Alcohol use: Not Currently    Comment: rare   Drug use: No    Family History  Problem Relation Age of Onset   Diabetes Mother        type 2  not sure onset   Diabetes Other    Hypertension Other    Colon cancer Neg Hx    Colon polyps Neg Hx    Esophageal cancer Neg Hx    Stomach cancer Neg Hx    Rectal cancer Neg Hx     Allergies  Allergen Reactions   Zoloft  [Sertraline Hcl]    Codeine Nausea Only and Other (See Comments)    Pt states she has taken this with no problems.  Other Reaction(s): GI Intolerance   Lamotrigine Rash    Other reaction(s): RASH    Medication list has been reviewed and updated.  Current Outpatient Medications on File Prior to Visit  Medication Sig Dispense Refill   allopurinol  (ZYLOPRIM ) 100 MG tablet TAKE 1 TABLET BY MOUTH EVERY DAY 90 tablet 1   amitriptyline  (ELAVIL ) 100 MG  tablet TAKE 1 TABLET BY MOUTH EVERYDAY AT BEDTIME 90 tablet 3   aspirin 81 MG tablet Take 81 mg by mouth daily.     Baclofen  5 MG TABS Take 1 tablet (5 mg total) by mouth 3 (three) times daily between meals as needed. 90 tablet 3   Biotin 10 MG CAPS Take 1 tablet by mouth daily.     carbamazepine  (TEGRETOL ) 200 MG tablet Take 1 tablet (200 mg total) by mouth in the morning and at bedtime. 180 tablet 3   cholecalciferol (VITAMIN D3) 25 MCG (1000 UNIT) tablet Take by mouth.     [facility] Lancets Micro  Thin 33G MISC APPLY IN THE MORNING AND AT BEDTIME. E11.42     empagliflozin  (JARDIANCE ) 10 MG TABS tablet Pt is holding right now to await recheck kidney function 12/29/23     finasteride (PROSCAR) 5 MG tablet      glucose blood (TRUE METRIX PRO BLOOD GLUCOSE) test strip Check blood sugars once daily 100 strip 12   HYDROcodone -acetaminophen  (NORCO/VICODIN) 5-325 MG tablet Take 1 tablet by mouth every 8 (eight) hours as needed. 8 tablet 0   Lancets 33G MISC 1 each by Does not apply route in the morning and at bedtime. E11.42 100 each 12   levothyroxine  (SYNTHROID ) 50 MCG tablet TAKE 1 TABLET BY MOUTH DAILY BEFORE BREAKFAST 90 tablet 1   lisinopril  (ZESTRIL ) 20 MG tablet TAKE 1 TABLET BY MOUTH EVERY DAY 90 tablet 1   omeprazole  (PRILOSEC) 20 MG capsule as needed.     rosuvastatin  (CRESTOR ) 20 MG tablet Take 1 tablet (20 mg total) by mouth daily. 90 tablet 3   rosuvastatin  (CRESTOR ) 20 MG tablet Take 1 tablet by mouth at bedtime.     sodium bicarbonate 650 MG tablet Take 650 mg by mouth 2 (two) times daily.     No current facility-administered medications on file prior to visit.    Review of Systems:  As per HPI- otherwise negative.   Physical Examination: Vitals:   03/23/24 1558 03/23/24 1627  BP: (!) 146/94 (!) 140/90  Pulse: 74   SpO2: 100%    Vitals:   03/23/24 1558  Weight: 139 lb (63 kg)  Height: 5' 3 (1.6 m)   Body mass index is 24.62 kg/m. Ideal Body Weight: Weight in (lb) to have  BMI = 25: 140.8  GEN: no acute distress.  Looks well.  Normal weight but has lost about 20 pounds since I saw her last HEENT: Atraumatic, Normocephalic.  Ears and Nose: No external deformity. CV: RRR, No M/G/R. No JVD. No thrill. No extra heart sounds. PULM: CTA B, no wheezes, crackles, rhonchi. No retractions. No resp. distress. No accessory muscle use. ABD: S, NT, ND, +BS. No rebound. No HSM. EXTR: No c/c/e PSYCH: Normally interactive. Conversant.    Assessment and Plan: Controlled type 2 diabetes mellitus with diabetic polyneuropathy, without long-term current use of insulin (HCC) - Plan: Hemoglobin A1c, Semaglutide ,0.25 or 0.5MG /DOS, (OZEMPIC , 0.25 OR 0.5 MG/DOSE,) 2 MG/3ML SOPN  Need for influenza vaccination - Plan: Flu vaccine HIGH DOSE PF(Fluzone Trivalent)  Assessment & Plan Type 2 diabetes mellitus with adverse effects of GLP-1 agonist therapy Nausea and decreased appetite from Ozempic . Weight loss of 20 pounds. A1c expected to be controlled, but adverse effects noted. Potential for alternative therapy if symptoms persist. - Reduce Ozempic  dose to 0.25 mg weekly. - Order A1c test to assess glycemic control. - Instruct to report persistent symptoms for potential medication change. - Refill Ozempic  at 0.25 mg for three months. - Encourage protein shakes for nutrition.  Hypertension Blood pressure approximately 140/90 mmHg.  General Health Maintenance Discussed importance of vaccinations. - Administer flu shot. Recommend COVID booster  - Reminded regarding colon cancer screening with A1c Signed Harlene Schroeder, MD  A1c level received 10/3- message to pt  Results for orders placed or performed in visit on 03/23/24  Hemoglobin A1c   Collection Time: 03/23/24  4:17 PM  Result Value Ref Range   Hgb A1c MFr Bld 6.9 (H) 4.6 - 6.5 %    "

## 2024-03-23 ENCOUNTER — Ambulatory Visit: Admitting: Family Medicine

## 2024-03-23 ENCOUNTER — Encounter: Payer: Self-pay | Admitting: Family Medicine

## 2024-03-23 VITALS — BP 140/90 | HR 74 | Ht 63.0 in | Wt 139.0 lb

## 2024-03-23 DIAGNOSIS — Z23 Encounter for immunization: Secondary | ICD-10-CM

## 2024-03-23 DIAGNOSIS — Z7985 Long-term (current) use of injectable non-insulin antidiabetic drugs: Secondary | ICD-10-CM | POA: Diagnosis not present

## 2024-03-23 DIAGNOSIS — E1142 Type 2 diabetes mellitus with diabetic polyneuropathy: Secondary | ICD-10-CM

## 2024-03-23 DIAGNOSIS — I10 Essential (primary) hypertension: Secondary | ICD-10-CM

## 2024-03-23 MED ORDER — OZEMPIC (0.25 OR 0.5 MG/DOSE) 2 MG/3ML ~~LOC~~ SOPN: PEN_INJECTOR | SUBCUTANEOUS | 1 refills | Status: DC

## 2024-03-23 NOTE — Patient Instructions (Signed)
 Please decrease your ozempic  to 025 mg weekly.  However if this is still too strong for you please stop use and alert me!  I will be in touch with your A1c from today Flu shot given today Recommend a covid booster this fall

## 2024-03-24 ENCOUNTER — Encounter: Payer: Self-pay | Admitting: Family Medicine

## 2024-03-24 LAB — HEMOGLOBIN A1C: Hgb A1c MFr Bld: 6.9 % — ABNORMAL HIGH (ref 4.6–6.5)

## 2024-04-06 ENCOUNTER — Other Ambulatory Visit (HOSPITAL_BASED_OUTPATIENT_CLINIC_OR_DEPARTMENT_OTHER): Payer: Self-pay | Admitting: Family Medicine

## 2024-04-06 DIAGNOSIS — Z139 Encounter for screening, unspecified: Secondary | ICD-10-CM

## 2024-04-10 ENCOUNTER — Ambulatory Visit (HOSPITAL_BASED_OUTPATIENT_CLINIC_OR_DEPARTMENT_OTHER)
Admission: RE | Admit: 2024-04-10 | Discharge: 2024-04-10 | Disposition: A | Discharge status: Home / Self Care | Source: Ambulatory Visit | Attending: Family Medicine | Admitting: Family Medicine

## 2024-04-10 ENCOUNTER — Encounter (HOSPITAL_BASED_OUTPATIENT_CLINIC_OR_DEPARTMENT_OTHER): Payer: Self-pay

## 2024-04-10 DIAGNOSIS — Z139 Encounter for screening, unspecified: Secondary | ICD-10-CM

## 2024-04-10 DIAGNOSIS — Z1231 Encounter for screening mammogram for malignant neoplasm of breast: Secondary | ICD-10-CM | POA: Diagnosis not present

## 2024-04-25 ENCOUNTER — Telehealth: Payer: Self-pay

## 2024-04-25 NOTE — Telephone Encounter (Signed)
 Spoke w/ Pt- informed of A1c results and informed results placed in mail to her.

## 2024-04-25 NOTE — Telephone Encounter (Signed)
 Copied from CRM #[id number]. Topic: Clinical - Lab/Test Results >> Apr 25, 2024  3:09 PM Mia F wrote: Reason for CRM: Pt called to get her lab results. Specialist attempted to relay message that was left by Dr Ubaldo. Pt asks what's her A1C levels and if she can get a copy mailed to her

## 2024-05-04 LAB — OPHTHALMOLOGY REPORT-SCANNED

## 2024-05-23 ENCOUNTER — Encounter: Attending: Physical Medicine and Rehabilitation | Admitting: Physical Medicine and Rehabilitation

## 2024-05-23 VITALS — BP 155/82 | HR 72 | Ht 63.0 in | Wt 133.0 lb

## 2024-05-23 DIAGNOSIS — E1141 Type 2 diabetes mellitus with diabetic mononeuropathy: Secondary | ICD-10-CM | POA: Insufficient documentation

## 2024-05-23 DIAGNOSIS — G8929 Other chronic pain: Secondary | ICD-10-CM | POA: Diagnosis not present

## 2024-05-23 DIAGNOSIS — Z794 Long term (current) use of insulin: Secondary | ICD-10-CM | POA: Diagnosis not present

## 2024-05-23 DIAGNOSIS — G629 Polyneuropathy, unspecified: Secondary | ICD-10-CM | POA: Insufficient documentation

## 2024-05-23 DIAGNOSIS — M25571 Pain in right ankle and joints of right foot: Secondary | ICD-10-CM | POA: Insufficient documentation

## 2024-05-23 DIAGNOSIS — E663 Overweight: Secondary | ICD-10-CM | POA: Insufficient documentation

## 2024-05-23 NOTE — Patient Instructions (Signed)
 HTN: Advised checking BP daily at home and logging results to bring into follow-up appointment with PCP and myself. -Reviewed BP meds today.  -Advised regarding healthy foods that can help lower blood pressure and provided with a list: 1) citrus foods- high in vitamins and minerals 2) salmon and other fatty fish - reduces inflammation and oxylipins 3) swiss chard (leafy green)- high level of nitrates 4) pumpkin seeds- one of the best natural sources of magnesium 5) Beans and lentils- high in fiber, magnesium, and potassium 6) Berries- high in flavonoids 7) Amaranth (whole grain, can be cooked similarly to rice and oats)- high in magnesium and fiber 8) Pistachios- even more effective at reducing BP than other nuts 9) Carrots- high in phenolic compounds that relax blood vessels and reduce inflammation 10) Celery- contain phthalides that relax tissues of arterial walls 11) Tomatoes- can also improve cholesterol and reduce risk of heart disease 12) Broccoli- good source of magnesium, calcium, and potassium 13) Greek yogurt: high in potassium and calcium 14) Herbs and spices: Celery seed, cilantro, saffron, lemongrass, black cumin, ginseng, cinnamon, cardamom, sweet basil, and ginger 15) Chia and flax seeds- also help to lower cholesterol and blood sugar 16) Beets- high levels of nitrates that relax blood vessels  17) spinach and bananas- high in potassium  -Provided lise of supplements that can help with hypertension:  1) magnesium: one high quality brand is Bioptemizers since it contains all 7 types of magnesium, otherwise over the counter magnesium gluconate 400mg  is a good option 2) B vitamins 3) vitamin D 4) potassium 5) CoQ10 6) L-arginine 7) Vitamin C 8) Beetroot -Educated that goal BP is 120/80. -Made goal to incorporate some of the above foods into diet.

## 2024-05-23 NOTE — Progress Notes (Signed)
 Subjective:    Patient ID: Caroline Simmons, female    DOB: 01-14-1956, 68 y.o.   MRN: [id number]  HPI Caroline Simmons is a 68 year old woman who presents to establish care for right foot pain.  1) Foot deformity:  -has been present since birth -right foot is smaller -discussed her MRI foot results  2) Right foot pain and swelling -started 2 years ago -she received medication and it does not work -she was started on allopurinol  and colchicine - she has stopped both of these medications  3) Neuropathy: -numbness bothers her -certain foods trigger her- such as sweet foods -this is from her diabetes -she is taking amitriptyline  -pain is 10/10 -she sleeps poorly as a result -Qutenza  improved pain by 50% -she would like to repeat patches again today -she has been consuming turmeric  4) Overweight: -BMI is normal -lost 30 lbs! -she has been trying to lose weight -she has been adding turmeric to her coffee or food -she has been taking topamax and this also helps with her pain  Pain Inventory Average Pain 10 Pain Right Now 0 My pain is intermittent, sharp, burning, stabbing, tingling, and aching  In the last 24 hours, has pain interfered with the following? General activity 0 Relation with others 0 Enjoyment of life 0 What TIME of day is your pain at its worst? morning  and night Sleep (in general) Fair  Pain is worse with: walking, sitting, standing, and some activites Pain improves with: rest and medication Relief from Meds: good  walk without assistance ability to climb steps?  yes do you drive?  yes Do you have any goals in this area?  yes  disabled: date disabled unknown  numbness tingling trouble walking dizziness anxiety  Any changes since last visit?  no  Any changes since last visit?  no    Family History  Problem Relation Age of Onset   Diabetes Mother        type 2  not sure onset   Diabetes Other    Hypertension Other    Colon cancer Neg Hx     Colon polyps Neg Hx    Esophageal cancer Neg Hx    Stomach cancer Neg Hx    Rectal cancer Neg Hx    Social History   Socioeconomic History   Marital status: Divorced    Spouse name: Not on file   Number of children: 3   Years of education: 14   Highest education level: Not on file  Occupational History   Occupation: disabled  Tobacco Use   Smoking status: Former    Current packs/day: 1.00    Average packs/day: 1 pack/day for 3.0 years (3.0 ttl pk-yrs)    Types: Cigarettes   Smokeless tobacco: Never   Tobacco comments:    quit 08/2003  Vaping Use   Vaping status: Never Used  Substance and Sexual Activity   Alcohol use: Not Currently    Comment: rare   Drug use: No   Sexual activity: Not Currently  Other Topics Concern   Not on file  Social History Narrative   Lives alone in a 2 story home.  Has 3 children.  On disability.  Did work for [employer] for 29 years.  Education: some college.       Right handed        Are you currently employed ? No    What is your current occupation?disability   Do you live at home alone? yes  Who lives with you?    What type of home do you live in: 1 story or 2 story? two    Caffeine 1 cup    Social Drivers of Health   Financial Resource Strain: Low Risk  (01/04/2024)   Overall Financial Resource Strain (CARDIA)    Difficulty of Paying Living Expenses: Not very hard  Food Insecurity: Food Insecurity Present (01/04/2024)   Hunger Vital Sign    Worried About Running Out of Food in the Last Year: Sometimes true    Ran Out of Food in the Last Year: Never true  Transportation Needs: No Transportation Needs (01/04/2024)   PRAPARE - Administrator, Civil Service (Medical): No    Lack of Transportation (Non-Medical): No  Physical Activity: Inactive (01/04/2024)   Exercise Vital Sign    Days of Exercise per Week: 0 days    Minutes of Exercise per Session: 0 min  Stress: No Stress Concern Present (01/04/2024)   [employer] - Occupational Stress Questionnaire    Feeling of Stress: Only a little  Social Connections: Moderately Isolated (01/04/2024)   Social Connection and Isolation Panel    Frequency of Communication with Friends and Family: More than three times a week    Frequency of Social Gatherings with Friends and Family: More than three times a week    Attends Religious Services: Never    Database Administrator or Organizations: No    Attends Banker Meetings: Never    Marital Status: Married   Past Surgical History:  Procedure Laterality Date   CATARACT EXTRACTION, BILATERAL     CESAREAN SECTION     1982/1991   COLONOSCOPY  2012   [facility]terson-F/V-movi(exc)-HPP   POLYPECTOMY  2012   HPP   RETINAL DETACHMENT REPAIR W/ SCLERAL BUCKLE LE  11/12/2011   WISDOM TOOTH EXTRACTION     Past Medical History:  Diagnosis Date   Alopecia    Anxiety    on meds   Arthritis    back   Cataract 2011   bilateral removal   Chronic kidney disease (CKD), stage III (moderate) (HCC)    Depression    on meds   Essential hypertension, benign    on meds   GERD (gastroesophageal reflux disease)    on meds   Hyperlipidemia    on meds   Hypothyroid    on meds   Insomnia, unspecified    Menopause    lmp 06/2007   Neuropathy    per pt   Type II or unspecified type diabetes mellitus without mention of complication, not stated as uncontrolled    on meds   Vitamin D  deficiency    BP (!) 155/82   Pulse 72   Ht 5' 3 (1.6 m)   Wt 133 lb (60.3 kg)   SpO2 98%   BMI 23.56 kg/m   Opioid Risk Score:   Fall Risk Score:  `1  Depression screen PHQ 2/9     03/23/2024    4:06 PM 01/04/2024    3:52 PM 11/29/2023    3:00 PM 08/16/2023    2:38 PM 02/16/2023    1:30 PM 01/07/2023    1:49 PM 12/30/2021    1:50 PM  Depression screen PHQ 2/9  Decreased Interest 0 0 0 0 1 0 0  Down, Depressed, Hopeless 0 0 0 0 2 0 1  PHQ - 2 Score 0 0 0 0 3 0 1  Altered sleeping  0    3     Tired, decreased energy 0    2    Change in appetite 0    0    Feeling bad or failure about yourself  0    1    Trouble concentrating 0    1    Moving slowly or fidgety/restless 0    1    Suicidal thoughts 0    0    PHQ-9 Score 0     11     Difficult doing work/chores Not difficult at all           Data saved with a previous flowsheet row definition    Review of Systems  Musculoskeletal:  Positive for gait problem.  Neurological:  Positive for dizziness and numbness.       Tingling  Psychiatric/Behavioral:         Anxiety  All other systems reviewed and are negative.      Objective:   Physical Exam Gen: no distress, normal appearing HEENT: oral mucosa pink and moist, NCAT Cardio: Reg rate Chest: normal effort, normal rate of breathing Abd: soft, non-distended Ext: no edema Psych: pleasant, normal affect Skin: intact Neuro: Alert and oriented x3, decreased sensation of feet Right foot deformity, smaller size, +toe curling, stable 12/2     Assessment & Plan:  1) Chronic Pain Syndrome secondary to neuropathy 2/2 diabetes -Discussed Qutenza  as an option for neuropathic pain control. Discussed that this is a capsaicin  patch, stronger than capsaicin  cream. Discussed that it is currently approved for diabetic peripheral neuropathy and post-herpetic neuralgia, but that it has also shown benefit in treating other forms of neuropathy. Provided patient with link to site to learn more about the patch: https://www.qutenza .com/. Discussed that the patch would be placed in office and benefits usually last 3 months. Discussed that unintended exposure to capsaicin  can cause severe irritation of eyes, mucous membranes, respiratory tract, and skin, but that Qutenza  is a local treatment and does not have the systemic side effects of other nerve medications. Discussed that there may be pain, itching, erythema, and decreased sensory function associated with the application of Qutenza . Side effects  usually subside within 1 week. A cold pack of analgesic medications can help with these side effects. Blood pressure can also be increased due to pain associated with administration of the patch.  We are recommending Qutenza  8% capsaicin  to treat this patient's pain. Qutenza  is the first-line treatment option recommended for diabetic peripheral neuropathy by the AACE and ADA.   Qutenza  is a safer option for this patient due to the following: Gabapentin use has been associated with increased risk of dementia Lyrica use has been associated with increased risk of heart failure Cymblata, Venlafaxine, and other SSRIs/SNRIs are associated with increased weight gain Lidocaine  5% has been prescribed/tried   -Provided with a pain relief journal and discussed that it contains foods and lifestyle tips to naturally help to improve pain. Discussed that these lifestyle strategies are also very good for health unlike some medications which can have negative side effects. Discussed that the act of keeping a journal can be therapeutic and helpful to realize patterns what helps to trigger and alleviate pain.      Turmeric to reduce inflammation--can be used in cooking or taken as a supplement.  Benefits of turmeric:  -Highly anti-inflammatory  -Increases antioxidants  -Improves memory, attention, brain disease  -Lowers risk of heart disease  -May help prevent cancer  -Decreases pain  -Alleviates  depression  -Delays aging and decreases risk of chronic disease  -Consume with black pepper to increase absorption    Turmeric Milk Recipe:  1 cup milk  1 tsp turmeric  1 tsp cinnamon  1 tsp grated ginger (optional)  Black pepper (boosts the anti-inflammatory properties of turmeric).  1 tsp honey   -Discussed current symptoms of pain and history of pain.  -Discussed benefits of exercise in reducing pain. -Discussed following foods that may reduce pain: 1) Ginger (especially studied for  arthritis)- reduce leukotriene production to decrease inflammation 2) Blueberries- high in phytonutrients that decrease inflammation 3) Salmon- marine omega-3s reduce joint swelling and pain 4) Pumpkin seeds- reduce inflammation 5) dark chocolate- reduces inflammation 6) turmeric- reduces inflammation 7) tart cherries - reduce pain and stiffness 8) extra virgin olive oil - its compound olecanthal helps to block prostaglandins  9) chili peppers- can be eaten or applied topically via capsaicin  10) mint- helpful for headache, muscle aches, joint pain, and itching 11) garlic- reduces inflammation  Link to further information on diet for chronic pain: http://www.bray.com/    2) Right Foot deformity and pain: -discussed that right foot is smaller  -discussed orthotic and she defers at this time.  -discussed that MRI results show tendinosis  Discussed extracorporeal shockwave therapy as a modality for treatment. Discussed that the device looks and feels like a massage gun and I would move it over the area of pain for about 10 minutes. The device releases sound waves to the area of pain and helps to improve blood flow and circulation to improve the healing process. Discuss that this initially induces inflammation and can sometimes cause short-term increase in pain. Discussed that we typically do three weekly treatments, but sometimes up to 6 if needed, and after 6 weeks long term benefits can sometimes be achieved. Discussed that this is an FDA approved device, but not covered by insurance and would cost $60 per session. Will scheduled patient for 6 consecutive appointments and can cancel latter three if benefits are achieved after first three sessions.    3) Right foot spasticity/toe curling: -baclofen  5mg  prescribed at night  4) HTN -baclofen  5mg  prescribed HS -encouraged eating nuts and fruits -Advised checking BP daily at  home and logging results to bring into follow-up appointment with PCP and myself. -Reviewed BP meds today.  -Advised regarding healthy foods that can help lower blood pressure and provided with a list: 1) citrus foods- high in vitamins and minerals 2) salmon and other fatty fish - reduces inflammation and oxylipins 3) swiss chard (leafy green)- high level of nitrates 4) pumpkin seeds- one of the best natural sources of magnesium 5) Beans and lentils- high in fiber, magnesium, and potassium 6) Berries- high in flavonoids 7) Amaranth (whole grain, can be cooked similarly to rice and oats)- high in magnesium and fiber 8) Pistachios- even more effective at reducing BP than other nuts 9) Carrots- high in phenolic compounds that relax blood vessels and reduce inflammation 10) Celery- contain phthalides that relax tissues of arterial walls 11) Tomatoes- can also improve cholesterol and reduce risk of heart disease 12) Broccoli- good source of magnesium, calcium , and potassium 13) Greek yogurt: high in potassium and calcium  14) Herbs and spices: Celery seed, cilantro, saffron, lemongrass, black cumin, ginseng, cinnamon, cardamom, sweet basil, and ginger 15) Chia and flax seeds- also help to lower cholesterol and blood sugar 16) Beets- high levels of nitrates that relax blood vessels  17) spinach and bananas- high in potassium  -  Provided lise of supplements that can help with hypertension:  1) magnesium: one high quality brand is Bioptemizers since it contains all 7 types of magnesium, otherwise over the counter magnesium gluconate 400mg  is a good option 2) B vitamins 3) vitamin D  4) potassium 5) CoQ10 6) L-arginine 7) Vitamin C 8) Beetroot -Educated that goal BP is 120/80. -Made goal to incorporate some of the above foods into diet.    5) Right ankle pain: -MRI results reviewed with her- discussed improvements compared to prior MRI -discussed that MRI shows continued tendinosis and  improvement in edema -recommended ankle raises  6) Overweight: Commended on 30 lbs weight loss! -continue topamax -continue turmeric -recommended daily celery juice first thing in the morning

## 2024-06-02 ENCOUNTER — Other Ambulatory Visit: Payer: Self-pay | Admitting: Family Medicine

## 2024-06-02 DIAGNOSIS — E785 Hyperlipidemia, unspecified: Secondary | ICD-10-CM

## 2024-06-13 ENCOUNTER — Telehealth: Payer: Self-pay | Admitting: Pharmacist

## 2024-06-13 NOTE — Progress Notes (Signed)
 Pharmacy Quality Measure Review  This patient is appearing on a report for being at risk of failing the adherence measure for diabetes medications this calendar year.   Medication: Ozempic  (directions still have 0.25mg  weekly as dose) Last fill date: 04/03/2024 for 28 day supply  Patient has 1 refill remaining. Next appointment with PCP is 06/19/2024.    Left voicemail for patient to return my call at their convenience. CB# [phone]  Madelin Ray, PharmD Clinical Pharmacist [facility]  Population Health [phone]

## 2024-06-17 NOTE — Progress Notes (Unsigned)
 [facility] [address] [place], [place] [address] [phone]  Date:  06/19/2024   Name:  Caroline Simmons   DOB:  11-26-1955   MRN:  [id number]  PCP:  Watt Harlene BROCKS, MD    Chief Complaint: No chief complaint on file.   History of Present Illness:  Caroline Simmons is a 68 y.o. very pleasant female patient who presents with the following:  Patient seen today for periodic follow-up.  I saw her most recently in October for follow-up of diabetes History of diet controlled diabetes with neuropathy, hypertension, hyperlipidemia, chronic kidney disease, hypothyroidism  She is under nephrology care with Dr. Fleurette with [facility] [place] IIIb chronic kidney disease She saw her nephrologist in September She was also seen by physical medicine and rehab earlier this month to follow-up her chronic pain secondary to diabetic neuropathy  Colon cancer screening appears to be due Foot exam can be updated Urine micro is handled by nephrology A1c completed in October-showed good control diabetes Lab Results  Component Value Date   HGBA1C 6.9 (H) 03/23/2024   Amitriptyline  Levothyroxine  Lisinopril  20 Crestor  20 Ozempic -?  Dose Jardiance ? Allopurinol  Aspirin Tegretol   Discussed the use of AI scribe software for clinical note transcription with the patient, who gave verbal consent to proceed.  History of Present Illness     Patient Active Problem List   Diagnosis Date Noted   Acute gout due to renal impairment involving right ankle 03/23/2022   Peroneal tenosynovitis 12/17/2021   Arthrosis of left midfoot 10/27/2021   Stress fracture of calcaneus 09/02/2021   Pes cavus of both feet 09/02/2021   Abnormal urinalysis 09/14/2019   Vitamin D  deficiency 08/10/2017   Secondary hyperparathyroidism of renal origin 07/30/2016   Chronic kidney disease, stage 3 (HCC) 04/08/2016   Dyslipidemia 04/02/2016   Proteinuria  04/02/2016   Fatigue 12/25/2013   Neuropathy 12/25/2013   Dyslipidemia associated with type 2 diabetes mellitus (HCC) 05/30/2013   Detached retina, left 11/25/2011   Depression with anxiety 11/25/2011   Neuropathy in diabetes (HCC) 09/15/2011   Insomnia, unspecified    Hypothyroidism    DIABETES MELLITUS, TYPE II 08/26/2010   Essential hypertension 08/26/2010   RECTAL PAIN 08/26/2010   Peripheral motor neuropathy 06/22/2009    Past Medical History:  Diagnosis Date   Alopecia    Anxiety    on meds   Arthritis    back   Cataract 2011   bilateral removal   Chronic kidney disease (CKD), stage III (moderate) (HCC)    Depression    on meds   Essential hypertension, benign    on meds   GERD (gastroesophageal reflux disease)    on meds   Hyperlipidemia    on meds   Hypothyroid    on meds   Insomnia, unspecified    Menopause    lmp 06/2007   Neuropathy    per pt   Type II or unspecified type diabetes mellitus without mention of complication, not stated as uncontrolled    on meds   Vitamin D  deficiency     Past Surgical History:  Procedure Laterality Date   CATARACT EXTRACTION, BILATERAL     CESAREAN SECTION     1982/1991   COLONOSCOPY  2012   Patterson-F/V-movi(exc)-HPP   POLYPECTOMY  2012   HPP   RETINAL DETACHMENT REPAIR W/ SCLERAL BUCKLE LE  11/12/2011   WISDOM TOOTH EXTRACTION      Social  History[1]  Family History  Problem Relation Age of Onset   Diabetes Mother        type 2  not sure onset   Diabetes Other    Hypertension Other    Colon cancer Neg Hx    Colon polyps Neg Hx    Esophageal cancer Neg Hx    Stomach cancer Neg Hx    Rectal cancer Neg Hx     Allergies[2]  Medication list has been reviewed and updated.  Medications Ordered Prior to Encounter[3]  Review of Systems:  As per HPI- otherwise negative.   Physical Examination: There were no vitals filed for this visit. There were no vitals filed for this visit. There is no height  or weight on file to calculate BMI. Ideal Body Weight:    GEN: no acute distress. HEENT: Atraumatic, Normocephalic.  Ears and Nose: No external deformity. CV: RRR, No M/G/R. No JVD. No thrill. No extra heart sounds. PULM: CTA B, no wheezes, crackles, rhonchi. No retractions. No resp. distress. No accessory muscle use. ABD: S, NT, ND, +BS. No rebound. No HSM. EXTR: No c/c/e PSYCH: Normally interactive. Conversant.    Assessment and Plan: No diagnosis found.  Assessment & Plan   Signed Harlene Schroeder, MD    [1]  Social History Tobacco Use   Smoking status: Former    Current packs/day: 1.00    Average packs/day: 1 pack/day for 3.0 years (3.0 ttl pk-yrs)    Types: Cigarettes   Smokeless tobacco: Never   Tobacco comments:    quit 08/2003  Vaping Use   Vaping status: Never Used  Substance Use Topics   Alcohol use: Not Currently    Comment: rare   Drug use: No  [2]  Allergies Allergen Reactions   Zoloft  [Sertraline Hcl]    Codeine Nausea Only and Other (See Comments)    Pt states she has taken this with no problems.  Other Reaction(s): GI Intolerance   Lamotrigine Rash    Other reaction(s): RASH  [3]  Current Outpatient Medications on File Prior to Visit  Medication Sig Dispense Refill   allopurinol  (ZYLOPRIM ) 100 MG tablet TAKE 1 TABLET BY MOUTH EVERY DAY 90 tablet 1   amitriptyline  (ELAVIL ) 100 MG tablet TAKE 1 TABLET BY MOUTH EVERYDAY AT BEDTIME 90 tablet 3   aspirin 81 MG tablet Take 81 mg by mouth daily.     Baclofen  5 MG TABS Take 1 tablet (5 mg total) by mouth 3 (three) times daily between meals as needed. 90 tablet 3   Biotin 10 MG CAPS Take 1 tablet by mouth daily.     carbamazepine  (TEGRETOL ) 200 MG tablet Take 1 tablet (200 mg total) by mouth in the morning and at bedtime. 180 tablet 3   cholecalciferol (VITAMIN D3) 25 MCG (1000 UNIT) tablet Take by mouth.     [facility] Lancets Micro Thin 33G MISC APPLY IN THE MORNING AND AT BEDTIME. E11.42      empagliflozin  (JARDIANCE ) 10 MG TABS tablet Pt is holding right now to await recheck kidney function 12/29/23     finasteride (PROSCAR) 5 MG tablet      glucose blood (TRUE METRIX PRO BLOOD GLUCOSE) test strip Check blood sugars once daily 100 strip 12   HYDROcodone -acetaminophen  (NORCO/VICODIN) 5-325 MG tablet Take 1 tablet by mouth every 8 (eight) hours as needed. 8 tablet 0   Lancets 33G MISC 1 each by Does not apply route in the morning and at bedtime. E11.42 100 each 12  levothyroxine  (SYNTHROID ) 50 MCG tablet TAKE 1 TABLET BY MOUTH DAILY BEFORE BREAKFAST 90 tablet 1   lisinopril  (ZESTRIL ) 20 MG tablet TAKE 1 TABLET BY MOUTH EVERY DAY 90 tablet 1   omeprazole  (PRILOSEC) 20 MG capsule as needed.     rosuvastatin  (CRESTOR ) 20 MG tablet Take 1 tablet (20 mg total) by mouth daily. 90 tablet 1   Semaglutide ,0.25 or 0.5MG /DOS, (OZEMPIC , 0.25 OR 0.5 MG/DOSE,) 2 MG/3ML SOPN Inject 0.25 mg [place] weekly 9 mL 1   sodium bicarbonate 650 MG tablet Take 650 mg by mouth 2 (two) times daily.     No current facility-administered medications on file prior to visit.   "

## 2024-06-19 ENCOUNTER — Encounter: Payer: Self-pay | Admitting: Family Medicine

## 2024-06-19 ENCOUNTER — Ambulatory Visit (INDEPENDENT_AMBULATORY_CARE_PROVIDER_SITE_OTHER): Admitting: Family Medicine

## 2024-06-19 VITALS — BP 128/74 | HR 60 | Ht 63.0 in | Wt 133.4 lb

## 2024-06-19 DIAGNOSIS — I10 Essential (primary) hypertension: Secondary | ICD-10-CM

## 2024-06-19 DIAGNOSIS — Z7985 Long-term (current) use of injectable non-insulin antidiabetic drugs: Secondary | ICD-10-CM

## 2024-06-19 DIAGNOSIS — N183 Chronic kidney disease, stage 3 unspecified: Secondary | ICD-10-CM | POA: Diagnosis not present

## 2024-06-19 DIAGNOSIS — E034 Atrophy of thyroid (acquired): Secondary | ICD-10-CM | POA: Diagnosis not present

## 2024-06-19 DIAGNOSIS — E1169 Type 2 diabetes mellitus with other specified complication: Secondary | ICD-10-CM | POA: Diagnosis not present

## 2024-06-19 DIAGNOSIS — E1142 Type 2 diabetes mellitus with diabetic polyneuropathy: Secondary | ICD-10-CM | POA: Diagnosis not present

## 2024-06-19 DIAGNOSIS — E785 Hyperlipidemia, unspecified: Secondary | ICD-10-CM | POA: Diagnosis not present

## 2024-06-19 NOTE — Patient Instructions (Signed)
 It was good to see you today- I will get in touch with Dr C about your colonoscopy We will check your A1c today

## 2024-06-20 ENCOUNTER — Telehealth: Payer: Self-pay

## 2024-06-20 LAB — HEMOGLOBIN A1C: Hgb A1c MFr Bld: 5.9 % (ref 4.6–6.5)

## 2024-06-20 NOTE — Telephone Encounter (Signed)
 Left message for patient to return call to our office to further discuss options for colonoscopy prep.  Will continue efforts.

## 2024-06-20 NOTE — Telephone Encounter (Signed)
-----   Message from [place], [place] sent at 06/19/2024  5:58 PM EST ----- No problem, we can take care of it.  Appreciate you reaching out and linking her back up with us .  Nat, can you please reach out to Tabithia to let her know that we do have other options for bowel prep, to include MiraLAX prep with Gatorade (can use Gatorade light or G2 which is less sweet) or even Sutab prep if she prefers the tablet form.  If she would prefer to speak with me or one of the APP's in person in the office, we can schedule an appointment for her. ----- Message ----- From: Watt Harlene BROCKS, MD Sent: 06/19/2024   4:24 PM EST To: Sandor San GAILS, DO  Hi Vito, I believe Earnestine is a bit overdue for her 3-year colonoscopy follow-up.  You guys may have reached out to her already; she was hesitant to get scoped again because she hated the taste of the prep.  I let her know there are likely alternatives to the prep she took last time that might be easier for her so I think she is willing to go ahead-if you would ask your staff to reach out to her again.  Thank you so much!  Jess

## 2024-06-21 ENCOUNTER — Ambulatory Visit: Payer: Self-pay | Admitting: Family Medicine

## 2024-06-26 NOTE — Telephone Encounter (Signed)
 Spoke with patient regarding recommendations to be scheduled for a colonoscopy with Dr San.  Patient prefers to come into the office to further discuss prep prior to colonoscopy.  She is aware that she is scheduled for an appointment with Dr San on 07-24-24 at 9:20am.  Appointment reminder mailed to patient home.

## 2024-07-03 ENCOUNTER — Other Ambulatory Visit: Payer: Self-pay | Admitting: Family Medicine

## 2024-07-03 MED ORDER — TRUEPLUS LANCETS 33G MISC: 99 refills | Status: AC

## 2024-07-10 ENCOUNTER — Telehealth: Payer: Self-pay

## 2024-07-10 ENCOUNTER — Other Ambulatory Visit (HOSPITAL_COMMUNITY): Payer: Self-pay

## 2024-07-10 NOTE — Telephone Encounter (Signed)
 Pharmacy Patient Advocate Encounter   Received notification from Physician's Office that prior authorization for Ozempic  is required/requested.   Insurance verification completed.   The patient is insured through [employer].   Per test claim: The current 28 day co-pay is, $297.  No PA needed at this time. This test claim was processed through [facility]- copay amounts may vary at other pharmacies due to pharmacy/plan contracts, or as the patient moves through the different stages of their insurance plan.     Patient will meet deductible after this fill and then it should be $47

## 2024-07-11 ENCOUNTER — Other Ambulatory Visit: Payer: Self-pay

## 2024-07-11 ENCOUNTER — Other Ambulatory Visit: Payer: Self-pay | Admitting: Family Medicine

## 2024-07-11 DIAGNOSIS — E1142 Type 2 diabetes mellitus with diabetic polyneuropathy: Secondary | ICD-10-CM

## 2024-07-11 MED ORDER — OZEMPIC (0.25 OR 0.5 MG/DOSE) 2 MG/3ML ~~LOC~~ SOPN: PEN_INJECTOR | SUBCUTANEOUS | 1 refills | Status: AC

## 2024-07-24 ENCOUNTER — Ambulatory Visit: Admitting: Gastroenterology

## 2024-09-05 ENCOUNTER — Encounter: Admitting: Physical Medicine and Rehabilitation

## 2024-09-06 ENCOUNTER — Ambulatory Visit: Admitting: Gastroenterology

## 2024-09-18 ENCOUNTER — Ambulatory Visit: Admitting: Neurology

## 2024-12-18 ENCOUNTER — Ambulatory Visit: Admitting: Family Medicine

## 2025-01-09 ENCOUNTER — Ambulatory Visit
